# Patient Record
Sex: Male | Born: 1945 | Race: White | Hispanic: No | Marital: Married | Smoking: Former smoker
Health system: Southern US, Community
[De-identification: ages and names within clinical notes are randomized; demographics above are authoritative.]

## PROBLEM LIST (undated history)

## (undated) DIAGNOSIS — M1990 Unspecified osteoarthritis, unspecified site: Secondary | ICD-10-CM

## (undated) DIAGNOSIS — I2510 Atherosclerotic heart disease of native coronary artery without angina pectoris: Secondary | ICD-10-CM

## (undated) DIAGNOSIS — F32A Depression, unspecified: Secondary | ICD-10-CM

## (undated) DIAGNOSIS — Z87442 Personal history of urinary calculi: Secondary | ICD-10-CM

## (undated) DIAGNOSIS — D126 Benign neoplasm of colon, unspecified: Secondary | ICD-10-CM

## (undated) DIAGNOSIS — F329 Major depressive disorder, single episode, unspecified: Secondary | ICD-10-CM

## (undated) DIAGNOSIS — K219 Gastro-esophageal reflux disease without esophagitis: Secondary | ICD-10-CM

## (undated) DIAGNOSIS — M109 Gout, unspecified: Secondary | ICD-10-CM

## (undated) DIAGNOSIS — E119 Type 2 diabetes mellitus without complications: Secondary | ICD-10-CM

## (undated) DIAGNOSIS — I10 Essential (primary) hypertension: Secondary | ICD-10-CM

## (undated) DIAGNOSIS — E669 Obesity, unspecified: Secondary | ICD-10-CM

## (undated) DIAGNOSIS — T7840XA Allergy, unspecified, initial encounter: Secondary | ICD-10-CM

## (undated) DIAGNOSIS — E039 Hypothyroidism, unspecified: Secondary | ICD-10-CM

## (undated) DIAGNOSIS — G4730 Sleep apnea, unspecified: Secondary | ICD-10-CM

## (undated) DIAGNOSIS — E785 Hyperlipidemia, unspecified: Secondary | ICD-10-CM

## (undated) DIAGNOSIS — I252 Old myocardial infarction: Secondary | ICD-10-CM

## (undated) DIAGNOSIS — N19 Unspecified kidney failure: Secondary | ICD-10-CM

## (undated) HISTORY — PX: CHOLECYSTECTOMY: SHX55

## (undated) HISTORY — PX: POLYPECTOMY: SHX149

## (undated) HISTORY — DX: Unspecified osteoarthritis, unspecified site: M19.90

## (undated) HISTORY — DX: Depression, unspecified: F32.A

## (undated) HISTORY — DX: Benign neoplasm of colon, unspecified: D12.6

## (undated) HISTORY — PX: COLONOSCOPY: SHX174

## (undated) HISTORY — DX: Allergy, unspecified, initial encounter: T78.40XA

## (undated) HISTORY — DX: Type 2 diabetes mellitus without complications: E11.9

---
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
## 1993-10-04 HISTORY — PX: CORONARY ANGIOPLASTY: SHX604

## 1994-10-04 HISTORY — PX: CORONARY ANGIOPLASTY WITH STENT PLACEMENT: SHX49

## 2000-03-14 ENCOUNTER — Emergency Department (HOSPITAL_COMMUNITY): Admission: EM | Admit: 2000-03-14 | Discharge: 2000-03-14 | Payer: Self-pay | Admitting: Emergency Medicine

## 2000-03-14 ENCOUNTER — Encounter: Payer: Self-pay | Admitting: Emergency Medicine

## 2004-12-16 ENCOUNTER — Encounter: Admission: RE | Admit: 2004-12-16 | Discharge: 2004-12-16 | Payer: Self-pay | Admitting: Internal Medicine

## 2005-02-02 ENCOUNTER — Ambulatory Visit: Payer: Self-pay | Admitting: Internal Medicine

## 2005-02-11 ENCOUNTER — Ambulatory Visit: Payer: Self-pay | Admitting: Internal Medicine

## 2005-03-03 ENCOUNTER — Ambulatory Visit: Payer: Self-pay | Admitting: Internal Medicine

## 2006-03-25 ENCOUNTER — Ambulatory Visit: Payer: Self-pay | Admitting: Internal Medicine

## 2006-09-02 ENCOUNTER — Emergency Department (HOSPITAL_COMMUNITY): Admission: EM | Admit: 2006-09-02 | Discharge: 2006-09-02 | Payer: Self-pay | Admitting: Emergency Medicine

## 2007-03-09 ENCOUNTER — Ambulatory Visit: Payer: Self-pay | Admitting: Internal Medicine

## 2007-03-15 ENCOUNTER — Encounter: Payer: Self-pay | Admitting: Gastroenterology

## 2007-03-15 ENCOUNTER — Ambulatory Visit: Payer: Self-pay | Admitting: Gastroenterology

## 2007-03-15 ENCOUNTER — Encounter: Payer: Self-pay | Admitting: Internal Medicine

## 2008-02-24 DIAGNOSIS — N184 Chronic kidney disease, stage 4 (severe): Secondary | ICD-10-CM | POA: Insufficient documentation

## 2008-02-24 DIAGNOSIS — K5730 Diverticulosis of large intestine without perforation or abscess without bleeding: Secondary | ICD-10-CM | POA: Insufficient documentation

## 2008-02-24 DIAGNOSIS — J309 Allergic rhinitis, unspecified: Secondary | ICD-10-CM | POA: Insufficient documentation

## 2008-02-24 DIAGNOSIS — M109 Gout, unspecified: Secondary | ICD-10-CM | POA: Insufficient documentation

## 2008-02-24 DIAGNOSIS — I2510 Atherosclerotic heart disease of native coronary artery without angina pectoris: Secondary | ICD-10-CM | POA: Insufficient documentation

## 2008-02-24 DIAGNOSIS — E7800 Pure hypercholesterolemia, unspecified: Secondary | ICD-10-CM | POA: Insufficient documentation

## 2008-02-24 DIAGNOSIS — Z87442 Personal history of urinary calculi: Secondary | ICD-10-CM | POA: Insufficient documentation

## 2008-02-24 DIAGNOSIS — I219 Acute myocardial infarction, unspecified: Secondary | ICD-10-CM | POA: Insufficient documentation

## 2008-02-24 DIAGNOSIS — I119 Hypertensive heart disease without heart failure: Secondary | ICD-10-CM | POA: Insufficient documentation

## 2008-02-24 DIAGNOSIS — K222 Esophageal obstruction: Secondary | ICD-10-CM | POA: Insufficient documentation

## 2008-02-24 DIAGNOSIS — K219 Gastro-esophageal reflux disease without esophagitis: Secondary | ICD-10-CM

## 2008-02-24 DIAGNOSIS — K649 Unspecified hemorrhoids: Secondary | ICD-10-CM | POA: Insufficient documentation

## 2008-02-24 DIAGNOSIS — F329 Major depressive disorder, single episode, unspecified: Secondary | ICD-10-CM | POA: Insufficient documentation

## 2008-02-24 DIAGNOSIS — D126 Benign neoplasm of colon, unspecified: Secondary | ICD-10-CM | POA: Insufficient documentation

## 2008-02-24 DIAGNOSIS — E785 Hyperlipidemia, unspecified: Secondary | ICD-10-CM | POA: Insufficient documentation

## 2008-02-24 DIAGNOSIS — E039 Hypothyroidism, unspecified: Secondary | ICD-10-CM | POA: Insufficient documentation

## 2008-02-24 HISTORY — DX: Gastro-esophageal reflux disease without esophagitis: K21.9

## 2010-03-03 ENCOUNTER — Emergency Department (HOSPITAL_COMMUNITY): Admission: EM | Admit: 2010-03-03 | Discharge: 2010-03-03 | Payer: Self-pay | Admitting: Emergency Medicine

## 2010-03-06 ENCOUNTER — Encounter (INDEPENDENT_AMBULATORY_CARE_PROVIDER_SITE_OTHER): Payer: Self-pay | Admitting: *Deleted

## 2010-11-05 NOTE — Procedures (Signed)
Summary: [facility]  [facility]   Imported By: Bubba Hales 03/09/2010 08:47:58  _____________________________________________________________________  External Attachment:    Type:   Image     Comment:   External Document

## 2010-11-05 NOTE — Letter (Signed)
Summary: Colonoscopy Letter  [facility]  [place], [place] [address]   Phone: [phone]  Fax: [phone]      March 06, 2010 MRN: [id number]   [place], [place]  [id number]   Dear Dalton Mitchell,   According to your medical record, it is time for you to schedule a Colonoscopy. The American Cancer Society recommends this procedure as a method to detect early colon cancer. Patients with a family history of colon cancer, or a personal history of colon polyps or inflammatory bowel disease are at increased risk.  This letter has been generated based on the recommendations made at the time of your procedure. If you feel that in your particular situation this may no longer apply, please contact our office.  Please call our office at [phone] to schedule this appointment or to update your records at your earliest convenience.  Thank you for cooperating with [place] to provide you with the very best care possible.   Sincerely,  Docia Chuck. Henrene Pastor, M.D.  [facility] [phone]

## 2010-12-21 LAB — POCT CARDIAC MARKERS
Myoglobin, poc: 126 ng/mL (ref 12–200)
Myoglobin, poc: 67.4 ng/mL (ref 12–200)

## 2010-12-21 LAB — BASIC METABOLIC PANEL
CO2: 23 mEq/L (ref 19–32)
Calcium: 9.5 mg/dL (ref 8.4–10.5)
Chloride: 105 mEq/L (ref 96–112)
GFR calc Af Amer: 44 mL/min — ABNORMAL LOW (ref >60)
Glucose, Bld: 118 mg/dL — ABNORMAL HIGH (ref 70–99)
Potassium: 4.2 mEq/L (ref 3.5–5.1)
Sodium: 137 mEq/L (ref 135–145)

## 2010-12-21 LAB — CBC
HCT: 45.6 % (ref 39.0–52.0)
Hemoglobin: 15.4 g/dL (ref 13.0–17.0)
MCHC: 33.9 g/dL (ref 30.0–36.0)
MCV: 94.8 fL (ref 78.0–100.0)
RBC: 4.81 MIL/uL (ref 4.22–5.81)
RDW: 13.1 % (ref 11.5–15.5)

## 2010-12-21 LAB — URINALYSIS, ROUTINE W REFLEX MICROSCOPIC
Bilirubin Urine: NEGATIVE
Glucose, UA: NEGATIVE mg/dL
Protein, ur: 30 mg/dL — AB
Urobilinogen, UA: 0.2 mg/dL (ref 0.0–1.0)

## 2010-12-21 LAB — URINE MICROSCOPIC-ADD ON

## 2011-02-16 NOTE — Assessment & Plan Note (Signed)
[facility]                         GASTROENTEROLOGY OFFICE NOTE   NAME:Dalton Mitchell, Dalton Mitchell                  MRN:          [id number]  DATE:03/09/2007                            DOB:          1946/08/18    REFERRING PHYSICIAN:  Janalyn Rouse, M.D.   OFFICE CONSULTATION NOTE   REASON FOR CONSULTATION:  1. Dysphagia.  2. Overdue for surveillance colonoscopy.   HISTORY:  This is a 65 year old white male with a history of  hypertension, coronary artery disease, hyperlipidemia, hypothyroidism,  depression, gout, chronic gastroesophageal reflux disease, and  adenomatous colon polyps.  The patient underwent initial surveillance  colonoscopy in May of 2006 because of a family history of colon cancer  in grandparent.  He was found to have a large sessile villous adenoma of  the ascending colon, which was removed in piecemeal fashion.  He was  also noted to have mild diverticulosis and hemorrhoids.  The patient was  to followup, but failed to do so on 2 separate occasions.  Lower GI  complaints include chronic constipation, for which he takes fiber.  He  denies abdominal pain or bleeding.  With regards to his reflux disease,  he had been on Pepcid.  This seems to control symptoms reasonably.  He  subsequently went off Pepcid, concerned that this may have contributed  to his constipation.  Thereafter, significant problems with heartburn  and indigestion as well.  He has had progressive intermittent solid food  dysphagia.  His weight has been fairly stable and fluctuates a few  pounds each way.   PAST MEDICAL HISTORY:  Extensive, as outlined above.   ALLERGIES:  No known drug allergies.   CURRENT MEDICATIONS:  1. Pepcid AC p.r.n.  2. Metoprolol 50 mg b.i.d.  3. Colchicine 0.6 mg b.i.d.  4. Xanax 0.5 mg at night.  5. Glucosamine 2 in the morning and 1 at night.  6. Multivitamin.  7. Fibercon.  8. Crestor 40 mg b.i.d.  9. Allopurinol 200 mg  daily.  10.Darvocet-N 100 p.r.n.  11.Protonix (recently started) 40 mg daily.  12.Synthroid 50 mcg daily.  13.Paxil 40 mg daily.  14.Methylin 5 mg p.r.n.   FAMILY HISTORY:  Grandparent with colon cancer.   SOCIAL HISTORY:  The patient is married.  He is accompanied by his wife.  They have 8 children.  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  He does not use alcohol  or tobacco.   PHYSICAL EXAM:  A well-appearing male in no acute distress.  Blood pressure 118/64, heart rate 72 and regular, weight 267.6 pounds.  HEENT:  Sclerae anicteric.  Conjunctivae pink.  Oral mucosa is intact.  No adenopathy.  LUNGS:  Clear.  HEART:  Regular.  ABDOMEN:  Obese and soft without tenderness, mass, or hernia.  Good  bowel sounds heard.  EXTREMITIES:  Without edema.   IMPRESSION:  1. Gastroesophageal reflux disease with intermittent dysphagia.  Rule      out peptic stricture.  Rule out neoplasm.  Rule out Barrett's      esophagus.  Rule out esophagitis.  2. Intermittent constipation, likely functional.  3. History of large ascending colon villous  polyp status post      piecemeal resection in 2006, overdue for surveillance.  4. Multiple medical problems.   RECOMMENDATIONS:  1. Continue Protonix 40 mg p.o. daily.  2. Reflux precautions with attention to weight loss.  Elevation of the      head of the bed, and avoidance of late night meals.  3. Schedule upper endoscopy with possible esophageal dilation.  The      nature of the procedure, as well as the risks, benefits, and      alternatives have been reviewed.  He understood and agreed to      proceed.  4. Schedule surveillance colonoscopy with polypectomy if necessary.      The nature of the procedure, as well as the risks, benefits, and      alternatives were reviewed.  He understood and agreed to proceed.     Docia Chuck. Henrene Pastor, MD  Electronically Signed    JNP/MedQ  DD: 03/09/2007  DT: 03/09/2007  Job #: [id number]   cc:   Janalyn Rouse, M.D.

## 2011-05-26 ENCOUNTER — Encounter: Payer: Self-pay | Admitting: Internal Medicine

## 2011-06-11 ENCOUNTER — Ambulatory Visit (AMBULATORY_SURGERY_CENTER): Payer: Medicare Other | Admitting: *Deleted

## 2011-06-11 VITALS — Ht 70.0 in | Wt 254.0 lb

## 2011-06-11 DIAGNOSIS — Z1211 Encounter for screening for malignant neoplasm of colon: Secondary | ICD-10-CM

## 2011-06-11 MED ORDER — PEG-KCL-NACL-NASULF-NA ASC-C 100 G PO SOLR: ORAL | Status: DC

## 2011-06-14 ENCOUNTER — Encounter: Payer: Self-pay | Admitting: Internal Medicine

## 2011-06-15 ENCOUNTER — Telehealth: Payer: Self-pay | Admitting: Internal Medicine

## 2011-06-15 NOTE — Telephone Encounter (Signed)
Rx for MoviPrep called in to [facility] on [address].  Pt notified. Ulice Dash

## 2011-06-23 ENCOUNTER — Ambulatory Visit (AMBULATORY_SURGERY_CENTER): Payer: Medicare Other | Admitting: Internal Medicine

## 2011-06-23 ENCOUNTER — Telehealth: Payer: Self-pay | Admitting: Internal Medicine

## 2011-06-23 ENCOUNTER — Encounter: Payer: Self-pay | Admitting: Internal Medicine

## 2011-06-23 VITALS — BP 125/80 | HR 68 | Temp 97.7°F | Resp 18 | Ht 73.0 in | Wt 234.0 lb

## 2011-06-23 DIAGNOSIS — Z86010 Personal history of colonic polyps: Secondary | ICD-10-CM

## 2011-06-23 DIAGNOSIS — D1330 Benign neoplasm of unspecified part of small intestine: Secondary | ICD-10-CM

## 2011-06-23 DIAGNOSIS — Z1211 Encounter for screening for malignant neoplasm of colon: Secondary | ICD-10-CM

## 2011-06-23 DIAGNOSIS — D126 Benign neoplasm of colon, unspecified: Secondary | ICD-10-CM

## 2011-06-23 MED ORDER — SODIUM CHLORIDE 0.9 % IV SOLN: 500.0000 mL | INTRAVENOUS | Status: DC

## 2011-06-23 NOTE — Telephone Encounter (Signed)
Wife called at 6 AM this morning to state that she dropped the second half of his movi-prep on the floor. I recommended to proceed with one half of the MiraLAX prep.  One half of 255 g bottle mixed in 32 ounces of Gatorade I instructed him to drink 8 ounces every 15 minutes until complete. This should be satisfactory to complete bowel preparation. He will call with any problems or questions. He plans to arrive at his appointment as scheduled

## 2011-06-23 NOTE — Patient Instructions (Signed)
Discharge instructions given with verbal understanding.  Handouts on polyps and hemorrhoids given.  Resume previous medications.

## 2011-06-24 ENCOUNTER — Telehealth: Payer: Self-pay | Admitting: *Deleted

## 2011-06-24 NOTE — Telephone Encounter (Signed)
No voicemail id, no mess left

## 2012-08-25 ENCOUNTER — Emergency Department (HOSPITAL_COMMUNITY): Payer: Medicare PPO

## 2012-08-25 ENCOUNTER — Emergency Department (HOSPITAL_COMMUNITY)
Admission: EM | Admit: 2012-08-25 | Discharge: 2012-08-25 | Disposition: A | Discharge status: Home / Self Care | Payer: Medicare PPO | Attending: Emergency Medicine | Admitting: Emergency Medicine

## 2012-08-25 ENCOUNTER — Encounter (HOSPITAL_COMMUNITY): Payer: Self-pay | Admitting: Emergency Medicine

## 2012-08-25 DIAGNOSIS — I129 Hypertensive chronic kidney disease with stage 1 through stage 4 chronic kidney disease, or unspecified chronic kidney disease: Secondary | ICD-10-CM | POA: Insufficient documentation

## 2012-08-25 DIAGNOSIS — Y929 Unspecified place or not applicable: Secondary | ICD-10-CM | POA: Insufficient documentation

## 2012-08-25 DIAGNOSIS — E785 Hyperlipidemia, unspecified: Secondary | ICD-10-CM | POA: Insufficient documentation

## 2012-08-25 DIAGNOSIS — S79919A Unspecified injury of unspecified hip, initial encounter: Secondary | ICD-10-CM

## 2012-08-25 DIAGNOSIS — Z860100 Personal history of colon polyps, unspecified: Secondary | ICD-10-CM | POA: Insufficient documentation

## 2012-08-25 DIAGNOSIS — IMO0002 Reserved for concepts with insufficient information to code with codable children: Secondary | ICD-10-CM | POA: Insufficient documentation

## 2012-08-25 DIAGNOSIS — I252 Old myocardial infarction: Secondary | ICD-10-CM | POA: Insufficient documentation

## 2012-08-25 DIAGNOSIS — Z9861 Coronary angioplasty status: Secondary | ICD-10-CM | POA: Insufficient documentation

## 2012-08-25 DIAGNOSIS — E039 Hypothyroidism, unspecified: Secondary | ICD-10-CM | POA: Insufficient documentation

## 2012-08-25 DIAGNOSIS — N189 Chronic kidney disease, unspecified: Secondary | ICD-10-CM | POA: Insufficient documentation

## 2012-08-25 DIAGNOSIS — Z7982 Long term (current) use of aspirin: Secondary | ICD-10-CM | POA: Insufficient documentation

## 2012-08-25 DIAGNOSIS — Z87891 Personal history of nicotine dependence: Secondary | ICD-10-CM | POA: Insufficient documentation

## 2012-08-25 DIAGNOSIS — T07XXXA Unspecified multiple injuries, initial encounter: Secondary | ICD-10-CM

## 2012-08-25 DIAGNOSIS — Z79899 Other long term (current) drug therapy: Secondary | ICD-10-CM | POA: Insufficient documentation

## 2012-08-25 DIAGNOSIS — W11XXXA Fall on and from ladder, initial encounter: Secondary | ICD-10-CM

## 2012-08-25 DIAGNOSIS — Y939 Activity, unspecified: Secondary | ICD-10-CM | POA: Insufficient documentation

## 2012-08-25 DIAGNOSIS — Z8719 Personal history of other diseases of the digestive system: Secondary | ICD-10-CM | POA: Insufficient documentation

## 2012-08-25 NOTE — ED Notes (Signed)
Pt able to stand and ambulate slowly.

## 2012-08-25 NOTE — ED Provider Notes (Signed)
History     CSN: [id number]  Arrival date & time 08/25/12  1243   First MD Initiated Contact with Patient 08/25/12 1330      Chief Complaint  Patient presents with  . Fall    (Consider location/radiation/quality/duration/timing/severity/associated sxs/prior treatment) Patient is a 66 y.o. male presenting with fall. The history is provided by the patient. No language interpreter was used.  Fall The accident occurred less than 1 hour ago. The fall occurred from a ladder. He fell from a height of 16 to 20 ft. He landed on dirt. The volume of blood lost was minimal (abrasions). Point of impact: right side. The pain is present in the right hip. The pain is at a severity of 1/10. The pain is mild. He was ambulatory at the scene. There was no entrapment after the fall. There was no drug use involved in the accident. There was no alcohol use involved in the accident. Pertinent negatives include no visual change, no fever, no numbness, no abdominal pain, no bowel incontinence, no nausea, no vomiting, no hematuria, no headaches, no hearing loss, no loss of consciousness and no tingling. The symptoms are aggravated by standing and ambulation. He has tried nothing for the symptoms.    Past Medical History  Diagnosis Date  . Allergy   . GERD (gastroesophageal reflux disease)   . Hyperlipidemia   . Hypertension   . Chronic kidney disease     40% function  . Myocardial infarction 1989  1995  . Thyroid disease     hypo  . Adenomatous colon polyp     Past Surgical History  Procedure Date  . Coronary angioplasty with stent placement 1996  . Coronary angioplasty 1995  . Colonoscopy     No family history on file.  History  Substance Use Topics  . Smoking status: Former Research scientist (life sciences)  . Smokeless tobacco: Not on file  . Alcohol Use: No      Review of Systems  Constitutional: Negative for fever.  Gastrointestinal: Negative for nausea, vomiting, abdominal pain and bowel incontinence.    Genitourinary: Negative for hematuria.  Neurological: Negative for tingling, loss of consciousness, numbness and headaches.  All other systems reviewed and are negative.    Allergies  Review of patient's allergies indicates no known allergies.  Home Medications   Current Outpatient Rx  Name  Route  Sig  Dispense  Refill  . ALLOPURINOL 300 MG PO TABS   Oral   Take 300 mg by mouth daily.           [name] AMLODIPINE BESYLATE 5 MG PO TABS   Oral   Take 5 mg by mouth daily.           . ASPIRIN EC 81 MG PO TBEC   Oral   Take 81 mg by mouth daily.         [name] FAMOTIDINE 10 MG PO CHEW   Oral   Chew 10 mg by mouth daily.         [name] FLUTICASONE PROPIONATE 50 MCG/ACT NA SUSP   Nasal   Place 2 sprays into the nose daily as needed. For congestion.         [name] GLUCOSAMINE-CHONDROITIN 500-400 MG PO TABS   Oral   Take 1 tablet by mouth 2 (two) times daily.         [name] LEVOTHYROXINE SODIUM 75 MCG PO TABS   Oral   Take 75 mcg by mouth daily.           [name]  LOSARTAN POTASSIUM 50 MG PO TABS   Oral   Take 1 tablet by mouth daily.          [name] METOPROLOL TARTRATE 50 MG PO TABS   Oral   Take 50 mg by mouth 2 (two) times daily.           . MULTI-VITAMIN/MINERALS PO TABS   Oral   Take 1 tablet by mouth daily.           [name] NIASPAN 1000 MG PO TBCR   Oral   Take 1,000 mg by mouth at bedtime.          [name] ROSUVASTATIN CALCIUM 40 MG PO TABS   Oral   Take 40 mg by mouth daily.         [name] ZOLPIDEM TARTRATE 10 MG PO TABS   Oral   Take 10 mg by mouth at bedtime as needed. For sleep.           BP 147/88  Pulse 85  Temp 98.8 F (37.1 C) (Oral)  Resp 18  SpO2 95%  Physical Exam  Nursing note and vitals reviewed. Constitutional: He appears well-developed and well-nourished. No distress.  HENT:  Head: Normocephalic and atraumatic.  Right Ear: External ear normal.  Left Ear: External ear normal.  Nose: Nose normal.  Mouth/Throat: Oropharynx is clear and moist. No  oropharyngeal exudate.  Eyes: Conjunctivae normal are normal. Pupils are equal, round, and reactive to light. Right eye exhibits no discharge. Left eye exhibits no discharge. No scleral icterus.  Neck: Normal range of motion. Neck supple. No JVD present. No tracheal deviation present.  Cardiovascular: Normal rate, regular rhythm, normal heart sounds and intact distal pulses.  Exam reveals no gallop and no friction rub.   No murmur heard. Pulmonary/Chest: No stridor.  Abdominal: Soft. Bowel sounds are normal. He exhibits no distension and no mass. There is no tenderness. There is no rebound and no guarding.  Musculoskeletal: Normal range of motion. He exhibits tenderness. He exhibits no edema.       Right hip: He exhibits tenderness. He exhibits normal range of motion, normal strength, no bony tenderness, no swelling, no crepitus, no deformity and no laceration.  Lymphadenopathy:    He has no cervical adenopathy.  Neurological: He is alert. No cranial nerve deficit.  Skin: Skin is warm and dry. No rash noted. He is not diaphoretic. No erythema. No pallor.          All abrasions are extremely superficial   Psychiatric: He has a normal mood and affect. His behavior is normal.    ED Course  Procedures (including critical care time)  Labs Reviewed - No data to display No results found.   No diagnosis found.   Date: 08/25/2012  Rate: 82 bpm  Rhythm: sinus  QRS Axis: left  Intervals: normal  ST/T Wave abnormalities: normal  Conduction Disutrbances:none  Narrative Interpretation:   Old EKG Reviewed: unchanged     MDM  Pt presents s/p a fall from a ladder.  He appears comfortable, NAD.  Note stable VS.  Althugh he fell upwards of 18 feet to the ground, he did not strike his head, have a LOC, and was ambulatory immediately afterwards.  He does have some right hip pain. Will start with x-rays of the hip then attempt weight bearing if the films are negative for fracture.  On exam he hs  intact ROM and no significant reproducible pain.  1540.  Pt has been in the  ER almost 3 hours.  He has demonstrated no hemodynamic or neurologic instability during this time period.  He is able to bear weight on his right leg and the x-ray does not demonstrate a hip fracture.  The pain is reproducible along the lateral aspect of the thigh/hip but there is no appreciable tenseness or swelling.  He has no midline neck tenderness, respiratory insufficiency, chest wall tenderness, abd pain, or midline back tenderness/step-offs.  He states he has pain medication he can take at home already.  Discussed indications for immediate return to the ER.  Both he and his wife demonstrate understanding.  Stressed the importance of safety when working with ladders.  Will refer for evaluation by ortho if the pain persists beyond 2-3 days.      Perlie Mayo, MD 08/25/12 [phone]

## 2012-08-25 NOTE — ED Notes (Signed)
Pt fell 16 ft off a ladder onto grass and dirt onto right hip. Pt did not hit head. Pt feels pain in right hip when walking. Pt drove himself here.

## 2012-08-25 NOTE — ED Notes (Signed)
Pt discharged home via wife; pt given and explained all discharge instructions, stated understanding; denied questions/concerns; pt stable at time of discharge

## 2012-08-25 NOTE — ED Notes (Addendum)
Pt has abrasion to upper right shin , outer right calf and  left little finger

## 2013-09-13 ENCOUNTER — Encounter (HOSPITAL_COMMUNITY): Payer: Self-pay | Admitting: Emergency Medicine

## 2013-09-13 ENCOUNTER — Emergency Department (HOSPITAL_COMMUNITY): Payer: Medicare Other

## 2013-09-13 ENCOUNTER — Inpatient Hospital Stay (HOSPITAL_COMMUNITY)
Admission: EM | Admit: 2013-09-13 | Discharge: 2013-09-25 | DRG: 233 | Disposition: A | Discharge status: Home / Self Care | Payer: Medicare Other | Attending: Thoracic Surgery (Cardiothoracic Vascular Surgery) | Admitting: Thoracic Surgery (Cardiothoracic Vascular Surgery)

## 2013-09-13 DIAGNOSIS — E039 Hypothyroidism, unspecified: Secondary | ICD-10-CM | POA: Diagnosis present

## 2013-09-13 DIAGNOSIS — N170 Acute kidney failure with tubular necrosis: Secondary | ICD-10-CM | POA: Diagnosis not present

## 2013-09-13 DIAGNOSIS — K219 Gastro-esophageal reflux disease without esophagitis: Secondary | ICD-10-CM | POA: Diagnosis present

## 2013-09-13 DIAGNOSIS — I2582 Chronic total occlusion of coronary artery: Secondary | ICD-10-CM | POA: Diagnosis present

## 2013-09-13 DIAGNOSIS — N184 Chronic kidney disease, stage 4 (severe): Secondary | ICD-10-CM | POA: Diagnosis present

## 2013-09-13 DIAGNOSIS — Z7982 Long term (current) use of aspirin: Secondary | ICD-10-CM

## 2013-09-13 DIAGNOSIS — K5900 Constipation, unspecified: Secondary | ICD-10-CM | POA: Diagnosis not present

## 2013-09-13 DIAGNOSIS — M109 Gout, unspecified: Secondary | ICD-10-CM | POA: Diagnosis present

## 2013-09-13 DIAGNOSIS — Z860100 Personal history of colon polyps, unspecified: Secondary | ICD-10-CM

## 2013-09-13 DIAGNOSIS — Z823 Family history of stroke: Secondary | ICD-10-CM

## 2013-09-13 DIAGNOSIS — R7309 Other abnormal glucose: Secondary | ICD-10-CM | POA: Diagnosis not present

## 2013-09-13 DIAGNOSIS — I200 Unstable angina: Secondary | ICD-10-CM

## 2013-09-13 DIAGNOSIS — Z6837 Body mass index (BMI) 37.0-37.9, adult: Secondary | ICD-10-CM

## 2013-09-13 DIAGNOSIS — M129 Arthropathy, unspecified: Secondary | ICD-10-CM | POA: Diagnosis present

## 2013-09-13 DIAGNOSIS — I2510 Atherosclerotic heart disease of native coronary artery without angina pectoris: Principal | ICD-10-CM | POA: Diagnosis present

## 2013-09-13 DIAGNOSIS — E785 Hyperlipidemia, unspecified: Secondary | ICD-10-CM | POA: Diagnosis present

## 2013-09-13 DIAGNOSIS — I1310 Hypertensive heart and chronic kidney disease without heart failure, with stage 1 through stage 4 chronic kidney disease, or unspecified chronic kidney disease: Secondary | ICD-10-CM | POA: Diagnosis present

## 2013-09-13 DIAGNOSIS — I119 Hypertensive heart disease without heart failure: Secondary | ICD-10-CM | POA: Diagnosis present

## 2013-09-13 DIAGNOSIS — Y832 Surgical operation with anastomosis, bypass or graft as the cause of abnormal reaction of the patient, or of later complication, without mention of misadventure at the time of the procedure: Secondary | ICD-10-CM | POA: Diagnosis not present

## 2013-09-13 DIAGNOSIS — I4891 Unspecified atrial fibrillation: Secondary | ICD-10-CM | POA: Diagnosis not present

## 2013-09-13 DIAGNOSIS — H532 Diplopia: Secondary | ICD-10-CM | POA: Diagnosis present

## 2013-09-13 DIAGNOSIS — J9819 Other pulmonary collapse: Secondary | ICD-10-CM | POA: Diagnosis not present

## 2013-09-13 DIAGNOSIS — R443 Hallucinations, unspecified: Secondary | ICD-10-CM | POA: Diagnosis not present

## 2013-09-13 DIAGNOSIS — IMO0002 Reserved for concepts with insufficient information to code with codable children: Secondary | ICD-10-CM | POA: Diagnosis not present

## 2013-09-13 DIAGNOSIS — H538 Other visual disturbances: Secondary | ICD-10-CM | POA: Diagnosis present

## 2013-09-13 DIAGNOSIS — D62 Acute posthemorrhagic anemia: Secondary | ICD-10-CM | POA: Diagnosis not present

## 2013-09-13 DIAGNOSIS — D696 Thrombocytopenia, unspecified: Secondary | ICD-10-CM | POA: Diagnosis not present

## 2013-09-13 DIAGNOSIS — I10 Essential (primary) hypertension: Secondary | ICD-10-CM | POA: Diagnosis not present

## 2013-09-13 DIAGNOSIS — I252 Old myocardial infarction: Secondary | ICD-10-CM

## 2013-09-13 DIAGNOSIS — E669 Obesity, unspecified: Secondary | ICD-10-CM | POA: Insufficient documentation

## 2013-09-13 DIAGNOSIS — Z87891 Personal history of nicotine dependence: Secondary | ICD-10-CM

## 2013-09-13 DIAGNOSIS — Z951 Presence of aortocoronary bypass graft: Secondary | ICD-10-CM

## 2013-09-13 DIAGNOSIS — E875 Hyperkalemia: Secondary | ICD-10-CM | POA: Diagnosis not present

## 2013-09-13 DIAGNOSIS — E8779 Other fluid overload: Secondary | ICD-10-CM | POA: Diagnosis not present

## 2013-09-13 DIAGNOSIS — Z9861 Coronary angioplasty status: Secondary | ICD-10-CM

## 2013-09-13 DIAGNOSIS — G4730 Sleep apnea, unspecified: Secondary | ICD-10-CM | POA: Insufficient documentation

## 2013-09-13 HISTORY — DX: Gastro-esophageal reflux disease without esophagitis: K21.9

## 2013-09-13 HISTORY — DX: Essential (primary) hypertension: I10

## 2013-09-13 HISTORY — DX: Hypothyroidism, unspecified: E03.9

## 2013-09-13 HISTORY — DX: Gout, unspecified: M10.9

## 2013-09-13 HISTORY — DX: Unspecified kidney failure: N19

## 2013-09-13 HISTORY — DX: Sleep apnea, unspecified: G47.30

## 2013-09-13 HISTORY — DX: Hyperlipidemia, unspecified: E78.5

## 2013-09-13 HISTORY — DX: Obesity, unspecified: E66.9

## 2013-09-13 HISTORY — DX: Old myocardial infarction: I25.2

## 2013-09-13 HISTORY — DX: Atherosclerotic heart disease of native coronary artery without angina pectoris: I25.10

## 2013-09-13 HISTORY — DX: Personal history of urinary calculi: Z87.442

## 2013-09-13 HISTORY — DX: Unstable angina: I20.0

## 2013-09-13 LAB — COMPREHENSIVE METABOLIC PANEL
ALT: 23 U/L (ref 0–53)
ALT: 20 U/L (ref 0–53)
AST: 20 U/L (ref 0–37)
Alkaline Phosphatase: 57 U/L (ref 39–117)
Alkaline Phosphatase: 57 U/L (ref 39–117)
CO2: 22 mEq/L (ref 19–32)
CO2: 24 mEq/L (ref 19–32)
Calcium: 9.6 mg/dL (ref 8.4–10.5)
Calcium: 9.4 mg/dL (ref 8.4–10.5)
Chloride: 105 mEq/L (ref 96–112)
Creatinine, Ser: 1.93 mg/dL — ABNORMAL HIGH (ref 0.50–1.35)
GFR calc Af Amer: 40 mL/min — ABNORMAL LOW (ref >90)
GFR calc Af Amer: 40 mL/min — ABNORMAL LOW (ref >90)
GFR calc non Af Amer: 35 mL/min — ABNORMAL LOW (ref >90)
Glucose, Bld: 98 mg/dL (ref 70–99)
Glucose, Bld: 92 mg/dL (ref 70–99)
Potassium: 4.1 mEq/L (ref 3.5–5.1)
Potassium: 4.4 mEq/L (ref 3.5–5.1)
Sodium: 139 mEq/L (ref 135–145)
Sodium: 141 mEq/L (ref 135–145)
Total Bilirubin: 0.4 mg/dL (ref 0.3–1.2)
Total Protein: 7.6 g/dL (ref 6.0–8.3)

## 2013-09-13 LAB — CBC WITH DIFFERENTIAL/PLATELET
Basophils Absolute: 0 10*3/uL (ref 0.0–0.1)
Hemoglobin: 16.5 g/dL (ref 13.0–17.0)
Lymphocytes Relative: 24 % (ref 12–46)
Lymphs Abs: 1.6 10*3/uL (ref 0.7–4.0)
MCV: 95.1 fL (ref 78.0–100.0)
Neutrophils Relative %: 62 % (ref 43–77)
Platelets: 178 10*3/uL (ref 150–400)
RBC: 5.09 MIL/uL (ref 4.22–5.81)
RDW: 13.5 % (ref 11.5–15.5)
WBC: 6.6 10*3/uL (ref 4.0–10.5)

## 2013-09-13 LAB — URINALYSIS, ROUTINE W REFLEX MICROSCOPIC
Bilirubin Urine: NEGATIVE
Glucose, UA: NEGATIVE mg/dL
Specific Gravity, Urine: 1.006 (ref 1.005–1.030)
Urobilinogen, UA: 0.2 mg/dL (ref 0.0–1.0)

## 2013-09-13 LAB — TSH: TSH: 3.053 u[IU]/mL (ref 0.350–4.500)

## 2013-09-13 LAB — APTT: aPTT: 35 seconds (ref 24–37)

## 2013-09-13 LAB — HEPARIN LEVEL (UNFRACTIONATED): Heparin Unfractionated: 0.5 IU/mL (ref 0.30–0.70)

## 2013-09-13 LAB — PROTIME-INR
INR: 1.01 (ref 0.00–1.49)
Prothrombin Time: 13.1 seconds (ref 11.6–15.2)
Prothrombin Time: 13.3 seconds (ref 11.6–15.2)

## 2013-09-13 LAB — URINE MICROSCOPIC-ADD ON

## 2013-09-13 MED ORDER — HEPARIN (PORCINE) IN NACL 100-0.45 UNIT/ML-% IJ SOLN
1300.0000 [IU]/h | INTRAMUSCULAR | Status: DC
  Administered 2013-09-13: 1300 [IU]/h via INTRAVENOUS
  Filled 2013-09-13 (×3): qty 250

## 2013-09-13 MED ORDER — ASPIRIN EC 81 MG PO TBEC
81.0000 mg | DELAYED_RELEASE_TABLET | Freq: Every day | ORAL | Status: DC
Start: 2013-09-13 — End: 2013-09-13

## 2013-09-13 MED ORDER — ASPIRIN EC 81 MG PO TBEC
81.0000 mg | DELAYED_RELEASE_TABLET | Freq: Every day | ORAL | Status: DC
  Administered 2013-09-15 – 2013-09-16 (×2): 81 mg via ORAL
  Filled 2013-09-13 (×3): qty 1

## 2013-09-13 MED ORDER — ZOLPIDEM TARTRATE 5 MG PO TABS: 5.0000 mg | ORAL_TABLET | Freq: Every evening | ORAL | Status: DC | PRN

## 2013-09-13 MED ORDER — HEPARIN BOLUS VIA INFUSION
4000.0000 [IU] | Freq: Once | INTRAVENOUS | Status: AC
  Administered 2013-09-13: 4000 [IU] via INTRAVENOUS
  Filled 2013-09-13: qty 4000

## 2013-09-13 MED ORDER — FAMOTIDINE 10 MG PO TABS
10.0000 mg | ORAL_TABLET | Freq: Every day | ORAL | Status: DC
  Administered 2013-09-14 – 2013-09-16 (×3): 10 mg via ORAL
  Filled 2013-09-13 (×4): qty 1

## 2013-09-13 MED ORDER — SODIUM CHLORIDE 0.9 % IJ SOLN: 3.0000 mL | INTRAMUSCULAR | Status: DC | PRN

## 2013-09-13 MED ORDER — METOPROLOL TARTRATE 50 MG PO TABS
50.0000 mg | ORAL_TABLET | Freq: Two times a day (BID) | ORAL | Status: DC
  Administered 2013-09-13 – 2013-09-16 (×7): 50 mg via ORAL
  Filled 2013-09-13 (×9): qty 1

## 2013-09-13 MED ORDER — DIAZEPAM 5 MG PO TABS
10.0000 mg | ORAL_TABLET | ORAL | Status: AC
  Administered 2013-09-14: 10 mg via ORAL
  Filled 2013-09-13: qty 2

## 2013-09-13 MED ORDER — NITROGLYCERIN 0.4 MG SL SUBL: 0.4000 mg | SUBLINGUAL_TABLET | SUBLINGUAL | Status: DC | PRN

## 2013-09-13 MED ORDER — ALLOPURINOL 300 MG PO TABS
300.0000 mg | ORAL_TABLET | Freq: Every day | ORAL | Status: DC
  Administered 2013-09-14 – 2013-09-16 (×3): 300 mg via ORAL
  Filled 2013-09-13 (×4): qty 1

## 2013-09-13 MED ORDER — HYDROCODONE-ACETAMINOPHEN 5-325 MG PO TABS: 1.0000 | ORAL_TABLET | Freq: Four times a day (QID) | ORAL | Status: DC | PRN

## 2013-09-13 MED ORDER — ZOLPIDEM TARTRATE 5 MG PO TABS
10.0000 mg | ORAL_TABLET | Freq: Every day | ORAL | Status: DC
  Administered 2013-09-13 – 2013-09-16 (×4): 10 mg via ORAL
  Filled 2013-09-13 (×4): qty 2

## 2013-09-13 MED ORDER — ASPIRIN EC 81 MG PO TBEC: 81.0000 mg | DELAYED_RELEASE_TABLET | Freq: Every day | ORAL | Status: DC

## 2013-09-13 MED ORDER — SODIUM CHLORIDE 0.9 % IV SOLN: 250.0000 mL | INTRAVENOUS | Status: DC | PRN

## 2013-09-13 MED ORDER — FLUTICASONE PROPIONATE 50 MCG/ACT NA SUSP
2.0000 | Freq: Every day | NASAL | Status: DC
  Administered 2013-09-13 – 2013-09-16 (×4): 2 via NASAL
  Filled 2013-09-13: qty 16

## 2013-09-13 MED ORDER — ALPRAZOLAM 0.5 MG PO TABS: 0.5000 mg | ORAL_TABLET | Freq: Every evening | ORAL | Status: DC | PRN

## 2013-09-13 MED ORDER — ASPIRIN 81 MG PO CHEW
81.0000 mg | CHEWABLE_TABLET | ORAL | Status: AC
  Administered 2013-09-14: 81 mg via ORAL
  Filled 2013-09-13: qty 1

## 2013-09-13 MED ORDER — FAMOTIDINE 10 MG PO CHEW: 10.0000 mg | CHEWABLE_TABLET | Freq: Every day | ORAL | Status: DC

## 2013-09-13 MED ORDER — AMLODIPINE BESYLATE 5 MG PO TABS
5.0000 mg | ORAL_TABLET | Freq: Every day | ORAL | Status: DC
  Administered 2013-09-14 – 2013-09-16 (×3): 5 mg via ORAL
  Filled 2013-09-13 (×4): qty 1

## 2013-09-13 MED ORDER — ACETAMINOPHEN 325 MG PO TABS: 650.0000 mg | ORAL_TABLET | ORAL | Status: DC | PRN

## 2013-09-13 MED ORDER — SODIUM CHLORIDE 0.9 % IV SOLN
1.0000 mL/kg/h | INTRAVENOUS | Status: DC
  Administered 2013-09-13: 1 mL/kg/h via INTRAVENOUS

## 2013-09-13 MED ORDER — ATORVASTATIN CALCIUM 10 MG PO TABS
10.0000 mg | ORAL_TABLET | Freq: Every day | ORAL | Status: DC
  Administered 2013-09-13 – 2013-09-16 (×4): 10 mg via ORAL
  Filled 2013-09-13 (×5): qty 1

## 2013-09-13 MED ORDER — LEVOTHYROXINE SODIUM 75 MCG PO TABS
75.0000 ug | ORAL_TABLET | Freq: Every day | ORAL | Status: DC
  Administered 2013-09-14 – 2013-09-16 (×3): 75 ug via ORAL
  Filled 2013-09-13 (×5): qty 1

## 2013-09-13 MED ORDER — SODIUM CHLORIDE 0.9 % IJ SOLN
3.0000 mL | Freq: Two times a day (BID) | INTRAMUSCULAR | Status: DC
  Administered 2013-09-15 – 2013-09-16 (×2): 3 mL via INTRAVENOUS

## 2013-09-13 NOTE — ED Notes (Signed)
Called main lab to run PT, INR & that they were already sent tube

## 2013-09-13 NOTE — Progress Notes (Signed)
No recurrent pain.  Appreciate nephrology note.  ECHO done earlier.  Will plan cath with limited contrast inlight of unstable angina and progressive symptoms. Cardiac catheterization was discussed with the patient fully including risks of myocardial infarction, death, stroke, bleeding, arrhythmia, dye allergy, renal insufficiency or bleeding.  The patient understands and is willing to proceed. Probable stage procedure if needed.  Kerry Hough MD [facility]

## 2013-09-13 NOTE — Progress Notes (Signed)
ANTICOAGULATION CONSULT NOTE - Initial Consult  Pharmacy Consult for heparin Indication: chest pain/ACS  No Known Allergies  Patient Measurements: Height: 5\' 9"  (175.3 cm) Weight: 246 lb (111.585 kg) IBW/kg (Calculated) : 70.7 Heparin Dosing Weight: 95kg  Vital Signs: Temp: 97.7 F (36.5 C) (12/11 0958) Temp src: Oral (12/11 0958) BP: 111/68 mmHg (12/11 1132) Pulse Rate: 59 (12/11 1132)  Labs:  Recent Labs  09/13/13 1040  HGB 16.5  HCT 48.4  PLT 178  CREATININE 1.93*  TROPONINI <0.30    Estimated Creatinine Clearance: 46.4 ml/min (by C-G formula based on Cr of 1.93).   Medical History: Past Medical History  Diagnosis Date  . Adenomatous colon polyp   . Old inferior wall myocardial infarction   . Sleep apnea   . Hypothyroidism   . Hyperlipidemia   . Gout   . Hypertensive heart disease   . CAD     Prior inferior MI treated with stent 1995    . GERD 02/24/2008  . 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 History of kidney stones   . Obesity (BMI 30-39.9)    Assessment: Dalton Mitchell with 4 intermittent episodes of CP since last night. Per RN note, he took ASA 325mg  with each episode of CP. Not on any anticoagulation PTA. Initial troponin negative. Baseline Hgb 16.5, plts 178. No bleeding noted.   Goal of Therapy:  Heparin level 0.3-0.7 units/ml Monitor platelets by anticoagulation protocol: Yes   Plan:  1. Stat aPTT/INR for baseline values 2. Heparin IV bolus of 4000 units x1 3. Start heparin gtt at 1300 units/hr 4. Heparin level in 6 hours 5. Daily heparin level and CBC 6. Follow for cath plans, [facility] plans, s/s bleeding  Janai Brannigan D. Flora Ratz, PharmD, BCPS Clinical Pharmacist Pager: [phone] 09/13/2013 11:54 AM

## 2013-09-13 NOTE — ED Notes (Signed)
States that he had an episode of exertional cp x 1 month ago and  Then yesterday it got worse took an asa today but did not take any nitro when he rested it  Got better

## 2013-09-13 NOTE — Progress Notes (Signed)
ANTICOAGULATION CONSULT NOTE - Initial Consult  Pharmacy Consult for heparin Indication: chest pain/ACS  Allergies  Allergen Reactions  . Ace Inhibitors Cough    Patient Measurements: Height: 5\' 9"  (175.3 cm) Weight: 247 lb (112.038 kg) IBW/kg (Calculated) : 70.7 Heparin Dosing Weight: 95kg  Vital Signs: Temp: 97.8 F (36.6 C) (12/11 1432) Temp src: Oral (12/11 1432) BP: 122/77 mmHg (12/11 1432) Pulse Rate: 62 (12/11 1432)  Labs:  Recent Labs  09/13/13 1040 09/13/13 1252 09/13/13 1254 09/13/13 1800  HGB 16.5  --   --   --   HCT 48.4  --   --   --   PLT 178  --   --   --   APTT 35 >200*  --   --   LABPROT 13.1 13.3  --   --   INR 1.01 1.03  --   --   HEPARINUNFRC  --   --   --  0.50  CREATININE 1.93* 1.93*  --   --   TROPONINI <0.30  --  <0.30 <0.30    Estimated Creatinine Clearance: 46.4 ml/min (by C-G formula based on Cr of 1.93).   Medical History: Past Medical History  Diagnosis Date  . Adenomatous colon polyp   . Old inferior wall myocardial infarction   . Sleep apnea   . Hypothyroidism   . Hyperlipidemia   . Gout   . Hypertensive heart disease   . CAD     Prior inferior MI treated with stent 1995    . GERD 02/24/2008  . 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 History of kidney stones   . Obesity (BMI 30-39.9)    Assessment: 6 hour heparin level is 0.5, therapeutic on 1300 units/hr IV heparin for chest pain/ACS in this 67 y.o male.  Baseline Hgb 16.5, plts 178. No bleeding noted. Dr. Wynonia Lawman notes tonight patient has no recurrent pain. Plan for cardiac cath.    Goal of Therapy:  Heparin level 0.3-0.7 units/ml Monitor platelets by anticoagulation protocol: Yes   Plan:  Continue heparin gtt at 1300 units/hr Daily heparin level and CBC Follow for cath plans, [facility] plans, s/s bleeding  Nicole Cella, RPh Clinical Pharmacist Pager: [phone] 09/13/2013

## 2013-09-13 NOTE — Progress Notes (Signed)
Echocardiogram 2D Echocardiogram has been performed.  Shalece Staffa 09/13/2013, 4:24 PM

## 2013-09-13 NOTE — ED Provider Notes (Signed)
CSN: [id number]     Arrival date & time 09/13/13  [id number] History   First MD Initiated Contact with Patient 09/13/13 1008     Chief Complaint  Patient presents with  . Chest Pain   (Consider location/radiation/quality/duration/timing/severity/associated sxs/prior Treatment) HPI Comments: Patient presents with episode of left-sided chest pain that came on last night and is taking out the garbage. His similar episode of pain this morning at rest. It resolved after aspirin about 20 minutes. It radiated to his left arm her mind him of his MI that he had many years ago. Last stress test was in January with Dr. Wynonia Lawman. He has one stent. He had an episode of chest pain about one month ago with exertion it is similar. He has not seen his cardiologist since. Denies any chest pain currently. No shortness of breath, nausea or dizziness.  The history is provided by the patient.    Past Medical History  Diagnosis Date  . Adenomatous colon polyp   . Old inferior wall myocardial infarction   . Sleep apnea   . Hypothyroidism   . Hyperlipidemia   . Gout   . Hypertensive heart disease   . CAD     Prior inferior MI treated with stent 1995    . GERD 02/24/2008  . 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 History of kidney stones   . Obesity (BMI 30-39.9)    Past Surgical History  Procedure Laterality Date  . Coronary angioplasty with stent placement  1996  . Coronary angioplasty  1995  . Colonoscopy     History reviewed. No pertinent family history. History  Substance Use Topics  . Smoking status: Former Research scientist (life sciences)  . Smokeless tobacco: Never Used  . Alcohol Use: No    Review of Systems  Constitutional: Negative for fever, activity change and appetite change.  Respiratory: Positive for chest tightness and shortness of breath. Negative for cough.   Cardiovascular: Positive for chest pain.  Gastrointestinal: Negative for nausea, vomiting and abdominal pain.  Genitourinary: Negative  for dysuria and hematuria.  Musculoskeletal: Negative for arthralgias, back pain and myalgias.  Skin: Negative for rash.  Neurological: Negative for dizziness, weakness and headaches.  A complete 10 system review of systems was obtained and all systems are negative except as noted in the HPI and PMH.    Allergies  Ace inhibitors  Home Medications   Current Outpatient Rx  Name  Route  Sig  Dispense  Refill  . allopurinol (ZYLOPRIM) 300 MG tablet   Oral   Take 300 mg by mouth daily.           [name] ALPRAZolam (XANAX) 0.5 MG tablet   Oral   Take 0.5 mg by mouth at bedtime as needed for sleep.         [name] amLODipine (NORVASC) 5 MG tablet   Oral   Take 5 mg by mouth daily.           [name] aspirin EC 81 MG tablet   Oral   Take 81 mg by mouth daily.         [name] atorvastatin (LIPITOR) 10 MG tablet   Oral   Take 10 mg by mouth at bedtime.         . famotidine (PEPCID AC) 10 MG chewable tablet   Oral   Chew 10 mg by mouth daily.         . fluticasone (FLONASE) 50 MCG/ACT nasal spray   Each  Nare   Place 2 sprays into both nostrils at bedtime.         [name] glucosamine-chondroitin 500-400 MG tablet   Oral   Take 1 tablet by mouth 2 (two) times daily.         [name] HYDROcodone-acetaminophen (NORCO/VICODIN) 5-325 MG per tablet   Oral   Take 1 tablet by mouth every 6 (six) hours as needed for moderate pain.         [name] levothyroxine (SYNTHROID, LEVOTHROID) 75 MCG tablet   Oral   Take 75 mcg by mouth daily.           [name] losartan (COZAAR) 50 MG tablet   Oral   Take 1 tablet by mouth daily.          . metoprolol (LOPRESSOR) 50 MG tablet   Oral   Take 50 mg by mouth 2 (two) times daily.         . Multiple Vitamins-Minerals (MULTIVITAMIN WITH MINERALS) tablet   Oral   Take 1 tablet by mouth daily.           [name] zolpidem (AMBIEN) 10 MG tablet   Oral   Take 10 mg by mouth at bedtime.           BP 124/71  Pulse 60  Temp(Src) 97.7 F (36.5 C) (Oral)  Resp 14  Ht  5\' 9"  (1.753 m)  Wt 246 lb (111.585 kg)  BMI 36.31 kg/m2  SpO2 97% Physical Exam  Constitutional: He is oriented to person, place, and time. He appears well-developed and well-nourished. No distress.  HENT:  Head: Normocephalic and atraumatic.  Mouth/Throat: Oropharynx is clear and moist. No oropharyngeal exudate.  Eyes: Conjunctivae and EOM are normal. Pupils are equal, round, and reactive to light.  Neck: Normal range of motion. Neck supple.  Cardiovascular: Normal rate, regular rhythm, normal heart sounds and intact distal pulses.   No murmur heard. Pulmonary/Chest: Effort normal and breath sounds normal. No respiratory distress.  Abdominal: Soft. There is no tenderness. There is no rebound and no guarding.  Musculoskeletal: Normal range of motion. He exhibits no edema and no tenderness.  Neurological: He is alert and oriented to person, place, and time. No cranial nerve deficit. He exhibits normal muscle tone. Coordination normal.  Skin: Skin is warm.    ED Course  Procedures (including critical care time) Labs Review Labs Reviewed  COMPREHENSIVE METABOLIC PANEL - Abnormal; Notable for the following:    BUN 30 (*)    Creatinine, Ser 1.93 (*)    GFR calc non Af Amer 35 (*)    GFR calc Af Amer 40 (*)    All other components within normal limits  URINALYSIS, ROUTINE W REFLEX MICROSCOPIC - Abnormal; Notable for the following:    Hgb urine dipstick SMALL (*)    Protein, ur 100 (*)    All other components within normal limits  CBC WITH DIFFERENTIAL  TROPONIN I  APTT  PROTIME-INR  URINE MICROSCOPIC-ADD ON  HEPARIN LEVEL (UNFRACTIONATED)  PROTIME-INR  APTT  TSH  COMPREHENSIVE METABOLIC PANEL  TROPONIN I  TROPONIN I  TROPONIN I   Imaging Review Dg Chest 2 View  09/13/2013   CLINICAL DATA:  Mid left-sided chest pain with tingling down arm.  EXAM: CHEST  2 VIEW  COMPARISON:  03/03/2010  FINDINGS: Cardiac silhouette is upper limits of normal in size, unchanged. Thoracic  aorta remains moderately tortuous. The lungs are well inflated and clear. There is no evidence of pleural effusion  or pneumothorax. Multilevel osteophytosis is noted in the thoracic spine.  IMPRESSION: No evidence of active cardiopulmonary disease.   Electronically Signed   By: Logan Bores   On: 09/13/2013 11:07    EKG Interpretation    Date/Time:  Thursday September 13 2013 09:58:00 EST Ventricular Rate:  66 PR Interval:  186 QRS Duration: 88 QT Interval:  378 QTC Calculation: 396 R Axis:   -13 Text Interpretation:  Normal sinus rhythm Cannot rule out Anterior infarct , age undetermined Abnormal ECG No significant change was found Confirmed by Wyvonnia Dusky  MD, Daemon Dowty ([id number]) on 09/13/2013 10:32:42 AM            MDM  No diagnosis found. Episode of chest pain today on exertion resolved after rest. Similar episode yesterday and one month ago. Feels similar to previous MI. Currently denies any chest pain, back pain, shortness of breath, nausea, vomiting or dizziness.  EKG is unchanged. Patient is pain-free at this time. Troponin is negative. He took aspirin at home. Creatinine appears to be at baseline. History is concerning for unstable angina. Heparin drip will be started. Discussed with the patient's cardiologist Dr. Wynonia Lawman.  Patient will be admitted for unstable angina.    Ezequiel Essex, MD 09/13/13 1254

## 2013-09-13 NOTE — ED Notes (Signed)
Cardiology MD, Dr. Wynonia Lawman at bedside.

## 2013-09-13 NOTE — ED Notes (Signed)
C/o 4 intermittent episodes left side CP since last night lasting approx 15-20 min.  Denies any SOB, nausea, diaphoresis with CP. Did notice certain mvmts made pain worse.  States last 2 episodes woke him from sleep last night. Reports took ASA 325mg  with each episode CP.  Has had total of 325mg  x4 since 1900 yesterday. Denies CP presently

## 2013-09-13 NOTE — Consult Note (Signed)
I was asked by Dr. 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 Justin Mend at [facility].  On 02/08/13 creat was 1.84mg /dl . On 12/8 creat was 2.10 mg/dl. Apparently he is felt to have had low risk for progression based upon historical data. Therapy for the CKD includes BP control and use of an ARB Losartan.  His additional renal risks include hypertension, nephrolithiasis and BPH. Patient admitted with unstable angina pectoris and is felt to need a heart catheterization and Renal is asked to assist in management.  Dr. Wynonia Lawman has held the Losartan.   Past Medical History  Diagnosis Date  . Adenomatous colon polyp   . Old inferior wall myocardial infarction   . Sleep apnea   . Hypothyroidism   . Hyperlipidemia   . Gout   . Hypertensive heart disease   . CAD     Prior inferior MI treated with stent 1995    . GERD 02/24/2008  . 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 History of kidney stones   . Obesity (BMI 30-39.9)    Past Surgical History  Procedure Laterality Date  . Coronary angioplasty with stent placement  1996  . Coronary angioplasty  1995  . Colonoscopy     Social History:  reports that he has quit smoking. He has never used smokeless tobacco. He reports that he does not drink alcohol or use illicit drugs. Allergies:  Allergies  Allergen Reactions  . Ace Inhibitors Cough   History reviewed. No pertinent family history.  Medications:  Scheduled: . [START ON 09/14/2013] allopurinol  300 mg Oral Daily  . [START ON 09/14/2013] amLODipine  5 mg Oral Daily  . aspirin EC  81 mg Oral Daily  . [START ON 09/14/2013] aspirin EC  81 mg Oral Daily  . atorvastatin  10 mg Oral QHS  . [START ON 09/14/2013] famotidine  10 mg Oral Daily  . fluticasone  2 spray Each Nare QHS  . [START ON 09/14/2013] levothyroxine  75 mcg Oral QAC breakfast  . metoprolol  50 mg Oral BID  . sodium chloride  3 mL Intravenous  Q12H  . zolpidem  10 mg Oral QHS    ROS: Non contributory except as discussed in HPI  Blood pressure 122/77, pulse 62, temperature 97.8 F (36.6 C), temperature source Oral, resp. rate 16, height 5\' 9"  (1.753 m), weight 112.038 kg (247 lb), SpO2 97.00%.  General appearance: alert and cooperative Head: Normocephalic, without obvious abnormality, atraumatic Eyes: negative Ears: norma ext Nose: Nares normal. Septum midline. Mucosa normal. No drainage or sinus tenderness. Throat: lips, mucosa, and tongue normal; teeth and gums normal Resp: clear to auscultation bilaterally Chest wall: no tenderness Cardio: regular rate and rhythm, S1, S2 normal, no murmur, click, rub or gallop GI: soft, non-tender; bowel sounds normal; no masses,  no organomegaly Extremities: edema trace Skin: Skin color, texture, turgor normal. No rashes or lesions Neurologic: Grossly normal Results for orders placed during the hospital encounter of 09/13/13 (from the past 48 hour(s))  CBC WITH DIFFERENTIAL     Status: None   Collection Time    09/13/13 10:40 AM      Result Value Range   WBC 6.6  4.0 - 10.5 K/uL   RBC 5.09  4.22 - 5.81 MIL/uL   Hemoglobin 16.5  13.0 - 17.0 g/dL   HCT 48.4  39.0 - 52.0 %   MCV  95.1  78.0 - 100.0 fL   MCH 32.4  26.0 - 34.0 pg   MCHC 34.1  30.0 - 36.0 g/dL   RDW 13.5  11.5 - 15.5 %   Platelets 178  150 - 400 K/uL   Neutrophils Relative % 62  43 - 77 %   Neutro Abs 4.1  1.7 - 7.7 K/uL   Lymphocytes Relative 24  12 - 46 %   Lymphs Abs 1.6  0.7 - 4.0 K/uL   Monocytes Relative 12  3 - 12 %   Monocytes Absolute 0.8  0.1 - 1.0 K/uL   Eosinophils Relative 2  0 - 5 %   Eosinophils Absolute 0.1  0.0 - 0.7 K/uL   Basophils Relative 0  0 - 1 %   Basophils Absolute 0.0  0.0 - 0.1 K/uL  COMPREHENSIVE METABOLIC PANEL     Status: Abnormal   Collection Time    09/13/13 10:40 AM      Result Value Range   Sodium 139  135 - 145 mEq/L   Potassium 4.1  3.5 - 5.1 mEq/L   Chloride 105  96 - 112  mEq/L   CO2 22  19 - 32 mEq/L   Glucose, Bld 98  70 - 99 mg/dL   BUN 30 (*) 6 - 23 mg/dL   Creatinine, Ser 1.93 (*) 0.50 - 1.35 mg/dL   Calcium 9.6  8.4 - 10.5 mg/dL   Total Protein 7.6  6.0 - 8.3 g/dL   Albumin 4.1  3.5 - 5.2 g/dL   AST 20  0 - 37 U/L   ALT 23  0 - 53 U/L   Alkaline Phosphatase 57  39 - 117 U/L   Total Bilirubin 0.4  0.3 - 1.2 mg/dL   GFR calc non Af Amer 35 (*) >90 mL/min   GFR calc Af Amer 40 (*) >90 mL/min   Comment: (NOTE)     The eGFR has been calculated using the CKD EPI equation.     This calculation has not been validated in all clinical situations.     eGFR's persistently <90 mL/min signify possible Chronic Kidney     Disease.  TROPONIN I     Status: None   Collection Time    09/13/13 10:40 AM      Result Value Range   Troponin I <0.30  <0.30 ng/mL   Comment:            Due to the release kinetics of cTnI,     a negative result within the first hours     of the onset of symptoms does not rule out     myocardial infarction with certainty.     If myocardial infarction is still suspected,     repeat the test at appropriate intervals.  APTT     Status: None   Collection Time    09/13/13 10:40 AM      Result Value Range   aPTT 35  24 - 37 seconds  PROTIME-INR     Status: None   Collection Time    09/13/13 10:40 AM      Result Value Range   Prothrombin Time 13.1  11.6 - 15.2 seconds   INR 1.01  0.00 - 1.49  URINALYSIS, ROUTINE W REFLEX MICROSCOPIC     Status: Abnormal   Collection Time    09/13/13 11:42 AM      Result Value Range   Color, Urine YELLOW  YELLOW   APPearance  CLEAR  CLEAR   Specific Gravity, Urine 1.006  1.005 - 1.030   pH 5.5  5.0 - 8.0   Glucose, UA NEGATIVE  NEGATIVE mg/dL   Hgb urine dipstick SMALL (*) NEGATIVE   Bilirubin Urine NEGATIVE  NEGATIVE   Ketones, ur NEGATIVE  NEGATIVE mg/dL   Protein, ur 100 (*) NEGATIVE mg/dL   Urobilinogen, UA 0.2  0.0 - 1.0 mg/dL   Nitrite NEGATIVE  NEGATIVE   Leukocytes, UA NEGATIVE   NEGATIVE  URINE MICROSCOPIC-ADD ON     Status: None   Collection Time    09/13/13 11:42 AM      Result Value Range   RBC / HPF 0-2  <3 RBC/hpf  PROTIME-INR     Status: None   Collection Time    09/13/13 12:52 PM      Result Value Range   Prothrombin Time 13.3  11.6 - 15.2 seconds   INR 1.03  0.00 - 1.49  APTT     Status: Abnormal   Collection Time    09/13/13 12:52 PM      Result Value Range   aPTT >200 (*) 24 - 37 seconds   Comment:            IF BASELINE aPTT IS ELEVATED,     SUGGEST PATIENT RISK ASSESSMENT     BE USED TO DETERMINE APPROPRIATE     ANTICOAGULANT THERAPY.     REPEATED TO VERIFY     CRITICAL RESULT CALLED TO, READ BACK BY AND VERIFIED WITH:     Jaymes Graff (RN) [id number] 09/13/2013 L. LOMAX  COMPREHENSIVE METABOLIC PANEL     Status: Abnormal   Collection Time    09/13/13 12:52 PM      Result Value Range   Sodium 141  135 - 145 mEq/L   Potassium 4.4  3.5 - 5.1 mEq/L   Chloride 105  96 - 112 mEq/L   CO2 24  19 - 32 mEq/L   Glucose, Bld 92  70 - 99 mg/dL   BUN 29 (*) 6 - 23 mg/dL   Creatinine, Ser 1.93 (*) 0.50 - 1.35 mg/dL   Calcium 9.4  8.4 - 10.5 mg/dL   Total Protein 7.6  6.0 - 8.3 g/dL   Albumin 4.2  3.5 - 5.2 g/dL   AST 21  0 - 37 U/L   ALT 20  0 - 53 U/L   Alkaline Phosphatase 57  39 - 117 U/L   Total Bilirubin 0.4  0.3 - 1.2 mg/dL   GFR calc non Af Amer 35 (*) >90 mL/min   GFR calc Af Amer 40 (*) >90 mL/min   Comment: (NOTE)     The eGFR has been calculated using the CKD EPI equation.     This calculation has not been validated in all clinical situations.     eGFR's persistently <90 mL/min signify possible Chronic Kidney     Disease.  TROPONIN I     Status: None   Collection Time    09/13/13 12:54 PM      Result Value Range   Troponin I <0.30  <0.30 ng/mL   Comment:            Due to the release kinetics of cTnI,     a negative result within the first hours     of the onset of symptoms does not rule out     myocardial infarction with certainty.      If myocardial infarction is  still suspected,     repeat the test at appropriate intervals.   Dg Chest 2 View  09/13/2013   CLINICAL DATA:  Mid left-sided chest pain with tingling down arm.  EXAM: CHEST  2 VIEW  COMPARISON:  03/03/2010  FINDINGS: Cardiac silhouette is upper limits of normal in size, unchanged. Thoracic aorta remains moderately tortuous. The lungs are well inflated and clear. There is no evidence of pleural effusion or pneumothorax. Multilevel osteophytosis is noted in the thoracic spine.  IMPRESSION: No evidence of active cardiopulmonary disease.   Electronically Signed   By: Logan Bores   On: 09/13/2013 11:07    Assessment:  1 Stage 3 CKD 2 Coronary Artery Disease Rec:: 1 Usual precautions to include avoidance of dye if possible, but if not then limit contrast load, stage procedures if necessary, hold ARB as you are doing and hydrate if cardiovascular status amenable to this.  Edithe Dobbin C 09/13/2013, 3:26 PM

## 2013-09-13 NOTE — ED Notes (Signed)
Patient transported to X-ray 

## 2013-09-13 NOTE — H&P (Signed)
History and Physical   Admit date: 09/13/2013 Name:  Dalton Mitchell Medical record number: [id number] DOB/Age:  Feb 27, 1946  67 y.o. male  Referring Physician:   [facility]  Primary Physician:   Dr. Lutricia Feil  Chief complaint/reason for admission: Chest pain  HPI:  This 67 year old male has a history of coronary artery disease. He had a previous inferior infarction in 1995 and had stenting of his right coronary artery. He had residual disease and has seen me for the past several years and has been asymptomatic. His last myocardial perfusion scan was 2 years ago which time he had an ejection fraction of 48% and no ischemia. One month ago he developed exertional substernal chest discomfort after taking the trash cans down to the streets suggestive of his previous angina. He was in his usual state of health since then and [facility] yesterday when he again had recurrent chest discomfort described as a deep, pressure-like and occurring at rest similar to his previous angina. He took the trash cans down to the street last night and had recurrent chest discomfort and had 2 episodes during the night occurring at rest. He had another episode this morning for which he took aspirin for and came to the emergency room. EKG and initial troponin were unremarkable. He is currently symptom-free and started on intravenous heparin. He denies PND, orthopnea or edema and has no claudication. He does have chronic kidney disease and is under the care of the nephrologists. It was attributed to the use of nonsteroidal anti-inflammatory several years ago.    Past Medical History  Diagnosis Date  . Adenomatous colon polyp   . Old inferior wall myocardial infarction   . Sleep apnea   . Hypothyroidism   . Hyperlipidemia   . Gout   . Hypertensive heart disease   . CAD     Prior inferior MI treated with stent 1995    . GERD 02/24/2008  . 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 History  of kidney stones   . Obesity (BMI 30-39.9)     Past Surgical History  Procedure Laterality Date  . Coronary angioplasty with stent placement  1996  . Coronary angioplasty  1995  . Colonoscopy     Allergies: is allergic to ace inhibitors.   Medications: Prior to Admission medications   Medication Sig Start Date End Date Taking? Authorizing Provider  allopurinol (ZYLOPRIM) 300 MG tablet Take 300 mg by mouth daily.     Yes Historical Provider, MD  ALPRAZolam Duanne Moron) 0.5 MG tablet Take 0.5 mg by mouth at bedtime as needed for sleep.   Yes Historical Provider, MD  amLODipine (NORVASC) 5 MG tablet Take 5 mg by mouth daily.     Yes Historical Provider, MD  aspirin EC 81 MG tablet Take 81 mg by mouth daily.   Yes Historical Provider, MD  atorvastatin (LIPITOR) 10 MG tablet Take 10 mg by mouth at bedtime.   Yes Historical Provider, MD  famotidine (PEPCID AC) 10 MG chewable tablet Chew 10 mg by mouth daily.   Yes Historical Provider, MD  fluticasone (FLONASE) 50 MCG/ACT nasal spray Place 2 sprays into both nostrils at bedtime.   Yes Historical Provider, MD  glucosamine-chondroitin 500-400 MG tablet Take 1 tablet by mouth 2 (two) times daily.   Yes Historical Provider, MD  HYDROcodone-acetaminophen (NORCO/VICODIN) 5-325 MG per tablet Take 1 tablet by mouth every 6 (six) hours as needed for moderate pain.   Yes Historical  Provider, MD  levothyroxine (SYNTHROID, LEVOTHROID) 75 MCG tablet Take 75 mcg by mouth daily.     Yes Historical Provider, MD  losartan (COZAAR) 50 MG tablet Take 1 tablet by mouth daily.  05/10/11  Yes Historical Provider, MD  metoprolol (LOPRESSOR) 50 MG tablet Take 50 mg by mouth 2 (two) times daily.   Yes Historical Provider, MD  Multiple Vitamins-Minerals (MULTIVITAMIN WITH MINERALS) tablet Take 1 tablet by mouth daily.     Yes Historical Provider, MD  zolpidem (AMBIEN) 10 MG tablet Take 10 mg by mouth at bedtime.    Yes Historical Provider, MD    Family History:  Family  Status  Relation Status Death Age  . Father Deceased 36    died of CVA  . Mother    . Brother Alive   . Brother Alive     Social History:   reports that he has quit smoking. He has never used smokeless tobacco. He reports that he does not drink alcohol or use illicit drugs.   History   Social History Narrative  . No narrative on file     Review of Systems:  Is been obese for many years. He has had some mild blurred vision. He has had an issue with recurrent cough. No GI problems. Occasional nocturia. 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 Estanislado Pandy Other than as noted above, the remainder of the review of systems is normal  Physical Exam: BP 124/71  Pulse 60  Temp(Src) 97.7 F (36.5 C) (Oral)  Resp 14  Ht 5\' 9"  (1.753 m)  Wt 111.585 kg (246 lb)  BMI 36.31 kg/m2  SpO2 97% General appearance: Pleasant obese white male in no acute distress Head: Normocephalic, without obvious abnormality, atraumatic Eyes: conjunctivae/corneas clear. PERRL, EOM's intact. Fundi not examined  Neck: no adenopathy, no carotid bruit, no JVD and supple, symmetrical, trachea midline Lungs: clear to auscultation bilaterally Heart: regular rate and rhythm, S1, S2 normal, no murmur, click, rub or gallop Abdomen: soft, non-tender; bowel sounds normal; no masses,  no organomegaly Rectal: deferred Extremities: extremities normal, atraumatic, no cyanosis or edema Pulses: 2+ and symmetric femoral, pulse is in the left foot were 2+, the right foot was 1+. Neurologic: Grossly normal  Labs: CBC  Recent Labs  09/13/13 1040  WBC 6.6  RBC 5.09  HGB 16.5  HCT 48.4  PLT 178  MCV 95.1  MCH 32.4  MCHC 34.1  RDW 13.5  LYMPHSABS 1.6  MONOABS 0.8  EOSABS 0.1  BASOSABS 0.0   CMP   Recent Labs  09/13/13 1040  NA 139  K 4.1  CL 105  CO2 22  GLUCOSE 98  BUN 30*  CREATININE 1.93*  CALCIUM 9.6  PROT 7.6  ALBUMIN 4.1  AST 20  ALT 23  ALKPHOS 57  BILITOT 0.4  GFRNONAA 35*   GFRAA 40*     Cardiac Panel (last 3 results)  Recent Labs  09/13/13 1040  TROPONINI <0.30   EKG: Normal EKG, no acute ischemic changes  Radiology: Heart size is upper limits of normal   IMPRESSIONS: 1. Unstable angina pectoris 2. Coronary artery disease with previous stents and previous inferior infarction 3. Stage IV chronic kidney disease 4. Hypertension currently controlled 5. Hyperlipidemia 6. Obesity  PLAN: Admit to telemetry. Intravenous heparin. Renal consultation. Obtain echocardiogram to evaluate LV function. Likely because of unstable angina will need cardiac catheterization but is at increased risk of renal complications from contrast.  Signed: W. Doristine Church. MD  [facility]  09/13/2013, 1:21 PM

## 2013-09-14 ENCOUNTER — Inpatient Hospital Stay (HOSPITAL_COMMUNITY): Payer: Medicare Other

## 2013-09-14 ENCOUNTER — Encounter (HOSPITAL_COMMUNITY)
Admission: EM | Disposition: A | Discharge status: Home / Self Care | Payer: Self-pay | Source: Home / Self Care | Attending: Thoracic Surgery (Cardiothoracic Vascular Surgery)

## 2013-09-14 ENCOUNTER — Other Ambulatory Visit: Payer: Self-pay | Admitting: *Deleted

## 2013-09-14 DIAGNOSIS — I2510 Atherosclerotic heart disease of native coronary artery without angina pectoris: Secondary | ICD-10-CM

## 2013-09-14 HISTORY — PX: LEFT HEART CATHETERIZATION WITH CORONARY ANGIOGRAM: SHX5451

## 2013-09-14 LAB — CBC
Hemoglobin: 15.8 g/dL (ref 13.0–17.0)
MCHC: 33.4 g/dL (ref 30.0–36.0)
MCV: 94 fL (ref 78.0–100.0)
Platelets: 176 10*3/uL (ref 150–400)
RDW: 13.6 % (ref 11.5–15.5)

## 2013-09-14 LAB — BASIC METABOLIC PANEL
CO2: 24 mEq/L (ref 19–32)
Calcium: 9 mg/dL (ref 8.4–10.5)
Creatinine, Ser: 1.91 mg/dL — ABNORMAL HIGH (ref 0.50–1.35)
GFR calc Af Amer: 40 mL/min — ABNORMAL LOW (ref >90)
GFR calc non Af Amer: 35 mL/min — ABNORMAL LOW (ref >90)
Glucose, Bld: 105 mg/dL — ABNORMAL HIGH (ref 70–99)
Potassium: 4 mEq/L (ref 3.5–5.1)
Sodium: 143 mEq/L (ref 135–145)

## 2013-09-14 LAB — PULMONARY FUNCTION TEST
DL/VA % pred: 129 %
DL/VA: 5.88 ml/min/mmHg/L
FEF 25-75 Pre: 3.61 L/sec
FEF2575-%Pred-Pre: 141 %
FEV1-%Pred-Pre: 91 %
FEV1-Pre: 2.99 L
FEV1FVC-%Pred-Pre: 113 %
FEV6-Pre: 3.57 L
FVC-Pre: 3.57 L
Pre FEV1/FVC ratio: 84 %
RV % pred: 61 %
RV: 1.43 L
TLC % pred: 74 %
TLC: 5.1 L

## 2013-09-14 LAB — TYPE AND SCREEN: Antibody Screen: NEGATIVE

## 2013-09-14 LAB — ABO/RH: ABO/RH(D): A POS

## 2013-09-14 LAB — HEPARIN LEVEL (UNFRACTIONATED)
Heparin Unfractionated: 0.7 IU/mL (ref 0.30–0.70)
Heparin Unfractionated: 0.38 IU/mL (ref 0.30–0.70)

## 2013-09-14 LAB — POCT ACTIVATED CLOTTING TIME: Activated Clotting Time: 127 seconds

## 2013-09-14 LAB — TROPONIN I: Troponin I: <0.3 ng/mL (ref <0.30)

## 2013-09-14 SURGERY — LEFT HEART CATHETERIZATION WITH CORONARY ANGIOGRAM
Anesthesia: LOCAL

## 2013-09-14 MED ORDER — ACETAMINOPHEN 325 MG PO TABS: 650.0000 mg | ORAL_TABLET | ORAL | Status: DC | PRN

## 2013-09-14 MED ORDER — HEPARIN (PORCINE) IN NACL 100-0.45 UNIT/ML-% IJ SOLN
1300.0000 [IU]/h | INTRAMUSCULAR | Status: DC
  Administered 2013-09-14 – 2013-09-15 (×2): 1300 [IU]/h via INTRAVENOUS
  Filled 2013-09-14 (×4): qty 250

## 2013-09-14 MED ORDER — LIDOCAINE HCL (PF) 1 % IJ SOLN
INTRAMUSCULAR | Status: AC
  Filled 2013-09-14: qty 30

## 2013-09-14 MED ORDER — HEPARIN (PORCINE) IN NACL 2-0.9 UNIT/ML-% IJ SOLN
INTRAMUSCULAR | Status: AC
  Filled 2013-09-14: qty 1000

## 2013-09-14 MED ORDER — SODIUM CHLORIDE 0.9 % IV SOLN
1.0000 mL/kg/h | INTRAVENOUS | Status: DC
  Administered 2013-09-14 (×2): 1 mL/kg/h via INTRAVENOUS

## 2013-09-14 MED ORDER — NITROGLYCERIN 0.2 MG/ML ON CALL CATH LAB
INTRAVENOUS | Status: AC
  Filled 2013-09-14: qty 1

## 2013-09-14 MED ORDER — ALPRAZOLAM 0.25 MG PO TABS
0.2500 mg | ORAL_TABLET | ORAL | Status: DC | PRN
Start: 2013-09-14 — End: 2013-09-17

## 2013-09-14 NOTE — Interval H&P Note (Signed)
History and Physical Interval Note:  09/14/2013 7:44 AM    Patient seen and examined.  No interval change in history and exam since last note.  Stable for procedure.  Kerry Hough. MD [facility]  09/14/2013

## 2013-09-14 NOTE — Progress Notes (Signed)
After bedrest completed, patient ambulated in room and groin site level 0.  Dressing removed and band aid applied.  Will continue to monitor. [place], Ardeth Sportsman

## 2013-09-14 NOTE — H&P (View-Only) (Signed)
No recurrent pain.  Appreciate nephrology note.  ECHO done earlier.  Will plan cath with limited contrast inlight of unstable angina and progressive symptoms. Cardiac catheterization was discussed with the patient fully including risks of myocardial infarction, death, stroke, bleeding, arrhythmia, dye allergy, renal insufficiency or bleeding.  The patient understands and is willing to proceed. Probable stage procedure if needed.  Kerry Hough MD [facility]

## 2013-09-14 NOTE — Progress Notes (Signed)
[id number] Cardiac Rehab Pt sleeping on arrival. His wife states that he does not sleep well at home. Did pre-op education with pt's wife. I gave her heart surgery booklet and showed her how to get on surgery video. I left IS for pt to use and explained our role in his care post-op. We will follow pt tomorrow, discuss pre-op education with him and ambulate if he is pain free.Deon Pilling, RN 09/14/2013 3:04 PM

## 2013-09-14 NOTE — Consult Note (Signed)
Reason for Consult: Severe 3 vessel CAD Referring Physician: Dr. Carlean Purl is an 67 y.o. male.  HPI: 67 yo semi-retired male with a history of CAD dating back to 103. He presents with a cc/o chest pain. He had an inferior MI in 1995 and had PTCA and stenting of his RCA at that time.  He had done well since then until recently. About a month ago he had an episode of chest pain while moving his trash can to the street. He says it is hard to describe the nature of the pain but it is severe and is consistent with his pain when he had the MI in 1995.  The evening PTA he once again took his trash cans to the street and had recurrent pain. He had two more episodes during the night that occurred at rest. The next morning he had another severe episode. He took an aspirin and came to the ED.  His initial EKG and enzymes were unremarkable. He was started on a heparin drip. He did rule out for MI by enzymes. An echocardiogram showed an EF of 50-55%. Today he had cardiac catheterization where he was found to have 3 vessel CAD.   He currently is pain free.   Past Medical History  Diagnosis Date  . Adenomatous colon polyp   . Old inferior wall myocardial infarction   . Sleep apnea   . Hypothyroidism   . Hyperlipidemia   . Gout   . Hypertensive heart disease   . CAD     Prior inferior MI treated with stent 1995    . GERD 02/24/2008  . 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 History of kidney stones   . Obesity (BMI 30-39.9)     Past Surgical History  Procedure Laterality Date  . Coronary angioplasty with stent placement  1996  . Coronary angioplasty  1995  . Colonoscopy      Family History: Father- CVA age 46  Social History:  reports that he has quit smoking. He has never used smokeless tobacco. He reports that he does not drink alcohol or use illicit drugs. Smoked in his 63s  Allergies:  Allergies  Allergen Reactions  . Ace Inhibitors Cough     Medications:  Prior to Admission:  Prescriptions prior to admission  Medication Sig Dispense Refill  . allopurinol (ZYLOPRIM) 300 MG tablet Take 300 mg by mouth daily.        [name] ALPRAZolam (XANAX) 0.5 MG tablet Take 0.5 mg by mouth at bedtime as needed for sleep.      [name] amLODipine (NORVASC) 5 MG tablet Take 5 mg by mouth daily.        [name] aspirin EC 81 MG tablet Take 81 mg by mouth daily.      [name] atorvastatin (LIPITOR) 10 MG tablet Take 10 mg by mouth at bedtime.      . famotidine (PEPCID AC) 10 MG chewable tablet Chew 10 mg by mouth daily.      . fluticasone (FLONASE) 50 MCG/ACT nasal spray Place 2 sprays into both nostrils at bedtime.      [name] glucosamine-chondroitin 500-400 MG tablet Take 1 tablet by mouth 2 (two) times daily.      [name] HYDROcodone-acetaminophen (NORCO/VICODIN) 5-325 MG per tablet Take 1 tablet by mouth every 6 (six) hours as needed for moderate pain.      [name] levothyroxine (SYNTHROID, LEVOTHROID) 75 MCG tablet Take 75 mcg by mouth daily.        [name]  losartan (COZAAR) 50 MG tablet Take 1 tablet by mouth daily.       . metoprolol (LOPRESSOR) 50 MG tablet Take 50 mg by mouth 2 (two) times daily.      . Multiple Vitamins-Minerals (MULTIVITAMIN WITH MINERALS) tablet Take 1 tablet by mouth daily.        [name] zolpidem (AMBIEN) 10 MG tablet Take 10 mg by mouth at bedtime.         Results for orders placed during the hospital encounter of 09/13/13 (from the past 48 hour(s))  CBC WITH DIFFERENTIAL     Status: None   Collection Time    09/13/13 10:40 AM      Result Value Range   WBC 6.6  4.0 - 10.5 K/uL   RBC 5.09  4.22 - 5.81 MIL/uL   Hemoglobin 16.5  13.0 - 17.0 g/dL   HCT 48.4  39.0 - 52.0 %   MCV 95.1  78.0 - 100.0 fL   MCH 32.4  26.0 - 34.0 pg   MCHC 34.1  30.0 - 36.0 g/dL   RDW 13.5  11.5 - 15.5 %   Platelets 178  150 - 400 K/uL   Neutrophils Relative % 62  43 - 77 %   Neutro Abs 4.1  1.7 - 7.7 K/uL   Lymphocytes Relative 24  12 - 46 %   Lymphs Abs 1.6  0.7 - 4.0 K/uL    Monocytes Relative 12  3 - 12 %   Monocytes Absolute 0.8  0.1 - 1.0 K/uL   Eosinophils Relative 2  0 - 5 %   Eosinophils Absolute 0.1  0.0 - 0.7 K/uL   Basophils Relative 0  0 - 1 %   Basophils Absolute 0.0  0.0 - 0.1 K/uL  COMPREHENSIVE METABOLIC PANEL     Status: Abnormal   Collection Time    09/13/13 10:40 AM      Result Value Range   Sodium 139  135 - 145 mEq/L   Potassium 4.1  3.5 - 5.1 mEq/L   Chloride 105  96 - 112 mEq/L   CO2 22  19 - 32 mEq/L   Glucose, Bld 98  70 - 99 mg/dL   BUN 30 (*) 6 - 23 mg/dL   Creatinine, Ser 1.93 (*) 0.50 - 1.35 mg/dL   Calcium 9.6  8.4 - 10.5 mg/dL   Total Protein 7.6  6.0 - 8.3 g/dL   Albumin 4.1  3.5 - 5.2 g/dL   AST 20  0 - 37 U/L   ALT 23  0 - 53 U/L   Alkaline Phosphatase 57  39 - 117 U/L   Total Bilirubin 0.4  0.3 - 1.2 mg/dL   GFR calc non Af Amer 35 (*) >90 mL/min   GFR calc Af Amer 40 (*) >90 mL/min   Comment: (NOTE)     The eGFR has been calculated using the CKD EPI equation.     This calculation has not been validated in all clinical situations.     eGFR's persistently <90 mL/min signify possible Chronic Kidney     Disease.  TROPONIN I     Status: None   Collection Time    09/13/13 10:40 AM      Result Value Range   Troponin I <0.30  <0.30 ng/mL   Comment:            Due to the release kinetics of cTnI,     a negative result within the first hours  of the onset of symptoms does not rule out     myocardial infarction with certainty.     If myocardial infarction is still suspected,     repeat the test at appropriate intervals.  APTT     Status: None   Collection Time    09/13/13 10:40 AM      Result Value Range   aPTT 35  24 - 37 seconds  PROTIME-INR     Status: None   Collection Time    09/13/13 10:40 AM      Result Value Range   Prothrombin Time 13.1  11.6 - 15.2 seconds   INR 1.01  0.00 - 1.49  URINALYSIS, ROUTINE W REFLEX MICROSCOPIC     Status: Abnormal   Collection Time    09/13/13 11:42 AM      Result  Value Range   Color, Urine YELLOW  YELLOW   APPearance CLEAR  CLEAR   Specific Gravity, Urine 1.006  1.005 - 1.030   pH 5.5  5.0 - 8.0   Glucose, UA NEGATIVE  NEGATIVE mg/dL   Hgb urine dipstick SMALL (*) NEGATIVE   Bilirubin Urine NEGATIVE  NEGATIVE   Ketones, ur NEGATIVE  NEGATIVE mg/dL   Protein, ur 100 (*) NEGATIVE mg/dL   Urobilinogen, UA 0.2  0.0 - 1.0 mg/dL   Nitrite NEGATIVE  NEGATIVE   Leukocytes, UA NEGATIVE  NEGATIVE  URINE MICROSCOPIC-ADD ON     Status: None   Collection Time    09/13/13 11:42 AM      Result Value Range   RBC / HPF 0-2  <3 RBC/hpf  PROTIME-INR     Status: None   Collection Time    09/13/13 12:52 PM      Result Value Range   Prothrombin Time 13.3  11.6 - 15.2 seconds   INR 1.03  0.00 - 1.49  APTT     Status: Abnormal   Collection Time    09/13/13 12:52 PM      Result Value Range   aPTT >200 (*) 24 - 37 seconds   Comment:            IF BASELINE aPTT IS ELEVATED,     SUGGEST PATIENT RISK ASSESSMENT     BE USED TO DETERMINE APPROPRIATE     ANTICOAGULANT THERAPY.     REPEATED TO VERIFY     CRITICAL RESULT CALLED TO, READ BACK BY AND VERIFIED WITH:     Jaymes Graff (RN) [id number] 09/13/2013 L. LOMAX  TSH     Status: None   Collection Time    09/13/13 12:52 PM      Result Value Range   TSH 3.053  0.350 - 4.500 uIU/mL   Comment: Performed at [place]     Status: Abnormal   Collection Time    09/13/13 12:52 PM      Result Value Range   Sodium 141  135 - 145 mEq/L   Potassium 4.4  3.5 - 5.1 mEq/L   Chloride 105  96 - 112 mEq/L   CO2 24  19 - 32 mEq/L   Glucose, Bld 92  70 - 99 mg/dL   BUN 29 (*) 6 - 23 mg/dL   Creatinine, Ser 1.93 (*) 0.50 - 1.35 mg/dL   Calcium 9.4  8.4 - 10.5 mg/dL   Total Protein 7.6  6.0 - 8.3 g/dL   Albumin 4.2  3.5 - 5.2 g/dL   AST 21  0 -  37 U/L   ALT 20  0 - 53 U/L   Alkaline Phosphatase 57  39 - 117 U/L   Total Bilirubin 0.4  0.3 - 1.2 mg/dL   GFR calc non Af Amer 35 (*) >90  mL/min   GFR calc Af Amer 40 (*) >90 mL/min   Comment: (NOTE)     The eGFR has been calculated using the CKD EPI equation.     This calculation has not been validated in all clinical situations.     eGFR's persistently <90 mL/min signify possible Chronic Kidney     Disease.  TROPONIN I     Status: None   Collection Time    09/13/13 12:54 PM      Result Value Range   Troponin I <0.30  <0.30 ng/mL   Comment:            Due to the release kinetics of cTnI,     a negative result within the first hours     of the onset of symptoms does not rule out     myocardial infarction with certainty.     If myocardial infarction is still suspected,     repeat the test at appropriate intervals.  HEPARIN LEVEL (UNFRACTIONATED)     Status: None   Collection Time    09/13/13  6:00 PM      Result Value Range   Heparin Unfractionated 0.50  0.30 - 0.70 IU/mL   Comment:            IF HEPARIN RESULTS ARE BELOW     EXPECTED VALUES, AND PATIENT     DOSAGE HAS BEEN CONFIRMED,     SUGGEST FOLLOW UP TESTING     OF ANTITHROMBIN III LEVELS.  TROPONIN I     Status: None   Collection Time    09/13/13  6:00 PM      Result Value Range   Troponin I <0.30  <0.30 ng/mL   Comment:            Due to the release kinetics of cTnI,     a negative result within the first hours     of the onset of symptoms does not rule out     myocardial infarction with certainty.     If myocardial infarction is still suspected,     repeat the test at appropriate intervals.  TROPONIN I     Status: None   Collection Time    09/14/13 12:20 AM      Result Value Range   Troponin I <0.30  <0.30 ng/mL   Comment:            Due to the release kinetics of cTnI,     a negative result within the first hours     of the onset of symptoms does not rule out     myocardial infarction with certainty.     If myocardial infarction is still suspected,     repeat the test at appropriate intervals.  HEPARIN LEVEL (UNFRACTIONATED)     Status:  None   Collection Time    09/14/13  6:50 AM      Result Value Range   Heparin Unfractionated 0.70  0.30 - 0.70 IU/mL   Comment:            IF HEPARIN RESULTS ARE BELOW     EXPECTED VALUES, AND PATIENT     DOSAGE HAS BEEN CONFIRMED,     SUGGEST FOLLOW UP TESTING  OF ANTITHROMBIN III LEVELS.  CBC     Status: None   Collection Time    09/14/13  6:50 AM      Result Value Range   WBC 6.0  4.0 - 10.5 K/uL   RBC 5.03  4.22 - 5.81 MIL/uL   Hemoglobin 15.8  13.0 - 17.0 g/dL   HCT 47.3  39.0 - 52.0 %   MCV 94.0  78.0 - 100.0 fL   MCH 31.4  26.0 - 34.0 pg   MCHC 33.4  30.0 - 36.0 g/dL   RDW 13.6  11.5 - 15.5 %   Platelets 176  150 - 400 K/uL  BASIC METABOLIC PANEL     Status: Abnormal   Collection Time    09/14/13  6:50 AM      Result Value Range   Sodium 143  135 - 145 mEq/L   Potassium 4.0  3.5 - 5.1 mEq/L   Chloride 109  96 - 112 mEq/L   CO2 24  19 - 32 mEq/L   Glucose, Bld 105 (*) 70 - 99 mg/dL   BUN 28 (*) 6 - 23 mg/dL   Creatinine, Ser 1.91 (*) 0.50 - 1.35 mg/dL   Calcium 9.0  8.4 - 10.5 mg/dL   GFR calc non Af Amer 35 (*) >90 mL/min   GFR calc Af Amer 40 (*) >90 mL/min   Comment: (NOTE)     The eGFR has been calculated using the CKD EPI equation.     This calculation has not been validated in all clinical situations.     eGFR's persistently <90 mL/min signify possible Chronic Kidney     Disease.    Dg Chest 2 View  09/13/2013   CLINICAL DATA:  Mid left-sided chest pain with tingling down arm.  EXAM: CHEST  2 VIEW  COMPARISON:  03/03/2010  FINDINGS: Cardiac silhouette is upper limits of normal in size, unchanged. Thoracic aorta remains moderately tortuous. The lungs are well inflated and clear. There is no evidence of pleural effusion or pneumothorax. Multilevel osteophytosis is noted in the thoracic spine.  IMPRESSION: No evidence of active cardiopulmonary disease.   Electronically Signed   By: Logan Bores   On: 09/13/2013 11:07    Review of Systems  Constitutional:  Negative for fever and chills.       Obese  Eyes: Positive for blurred vision (chronic).  Cardiovascular: Positive for chest pain.  Genitourinary:       Nocturia  Musculoskeletal: Positive for joint pain.  All other systems reviewed and are negative.   Blood pressure 127/79, pulse 57, temperature 98.2 F (36.8 C), temperature source Oral, resp. rate 18, height 5\' 9"  (1.753 m), weight 247 lb (112.038 kg), SpO2 95.00%. Physical Exam  Vitals reviewed. Constitutional: He is oriented to person, place, and time. No distress.  obese  HENT:  Head: Normocephalic and atraumatic.  Eyes: EOM are normal. Pupils are equal, round, and reactive to light.  Neck: Neck supple.  No carotid bruits  Cardiovascular: Normal rate, normal heart sounds and intact distal pulses.  Exam reveals no gallop and no friction rub.   No murmur heard. Respiratory: Effort normal. He has no wheezes. He has no rales.  GI: Soft. There is no tenderness.  Musculoskeletal: He exhibits no edema.  Lymphadenopathy:    He has no cervical adenopathy.  Neurological: He is alert and oriented to person, place, and time. No cranial nerve deficit.  No focal motor deficit  Skin: Skin is warm and  dry.   CARDIAC CATHETERIZATION  IMPRESSIONS:  1. 3 vessel coronary artery disease in a patient with unstable angina with occlusion of the right coronary artery, severe stenosis in the marginal branch and moderately severe LAD stenosis 2. Normal left ventricle by echocardiogram (50-55%)   ECHOCARDIOGRAM  Study Conclusions  - Left ventricle: The cavity size was normal. Wall thickness was increased in a pattern of mild LVH. Systolic function was normal. The estimated ejection fraction was in the range of 50% to 55%. Wall motion was normal; there were no regional wall motion abnormalities. Doppler parameters are consistent with abnormal left ventricular relaxation (grade 1 diastolic dysfunction). - Left atrium: The atrium was mildly  dilated.   Assessment/Plan: 67 yo man with multiple CRF and a history of CAD presents with unstable angina. He has severe 3 vessel CAD and only mild LV dysfunction(EF 50%). CABG is indicated for survival benefit and relief of symptoms.  I discussed the indications, risks, benefits and alternative treatments with Mr. And Mrs. Aquilino. They understand the general nature of the procedure, the need for general anesthesia, and the incisions to be used. We discussed the expected hospital stay, overall recovery and short and long term outcomes. They understand the risks include, but are not limited to death, stroke, MI, DVT/PE, bleeding, possible need for transfusion, infections, cardiac arrhythmias, and other organ system dysfunction including respiratory, renal, or GI complications. They understand he is at increased risk for renal complications given his CKD. They accept the risks and agree to proceed.  For CABG Monday December 15  HENDRICKSON,STEVEN C 09/14/2013, 2:15 PM

## 2013-09-14 NOTE — Progress Notes (Signed)
Patient stable after catheterization. Discussed bypass grafting with him. I reviewed his catheterization films with Dr. Tamala Julian who felt that he would be better suited for bypass for more complete revascularization. Obtain followup renal function over weekend. The patient will need to stay in the hospital in light of resting angina on intravenous heparin until surgery on Monday.  Kerry Hough MD [facility]

## 2013-09-14 NOTE — Progress Notes (Signed)
ANTICOAGULATION CONSULT NOTE - Follow Up Consult  Pharmacy Consult for Heparin Indication: chest pain/ACS  Allergies  Allergen Reactions  . Ace Inhibitors Cough    Patient Measurements: Height: 5\' 9"  (175.3 cm) Weight: 247 lb (112.038 kg) IBW/kg (Calculated) : 70.7 Heparin Dosing Weight: 95.5 kg  Vital Signs: Pulse Rate: 61 (12/12 0732)  Labs:  Recent Labs  09/13/13 1040 09/13/13 1252 09/13/13 1254 09/13/13 1800 09/14/13 0020 09/14/13 0650  HGB 16.5  --   --   --   --  15.8  HCT 48.4  --   --   --   --  47.3  PLT 178  --   --   --   --  176  APTT 35 >200*  --   --   --   --   LABPROT 13.1 13.3  --   --   --   --   INR 1.01 1.03  --   --   --   --   HEPARINUNFRC  --   --   --  0.50  --  0.70  CREATININE 1.93* 1.93*  --   --   --  1.91*  TROPONINI <0.30  --  <0.30 <0.30 <0.30  --     Estimated Creatinine Clearance: 46.9 ml/min (by C-G formula based on Cr of 1.91).   Medications:  Infusions:  . sodium chloride    . heparin 1,300 Units/hr (09/13/13 1231)    Assessment: 67 year old male s/p cardiac cath this morning.  He will be evaluated for possible CABG and his heparin is to restart 8 hours post sheath removal.  His sheath was removed and compression stopped at 0850 this morning.  He was previously therapeutic on a Heparin rate of 1300 units/hr.  Goal of Therapy:  Heparin level 0.3-0.7 units/ml Monitor platelets by anticoagulation protocol: Yes   Plan:  At 1700 today, restart Heparin at 1300 units/hr Check heparin level 6 hours after restarting Heparin. Daily Heparin level and CBC  Legrand Como, Pharm.D., BCPS, AAHIVP Clinical Pharmacist Phone: [phone] or [phone] 09/14/2013, 9:53 AM

## 2013-09-14 NOTE — Progress Notes (Signed)
Admit: 09/13/2013 LOS: 1  75M CKD3 (BL 1.9) w/ [place] and 3V disease for CABG 09/17/13  Subjective:  PCI today with 3V disease Current plan for CABG on Monday Good UOP.  Feels well. Breathing well  Filed Weights   09/13/13 1128 09/13/13 1432  Weight: 111.585 kg (246 lb) 112.038 kg (247 lb)    Current meds: reviewed, ARB held at admission.  Current Labs: reviewed    Physical Exam:  Blood pressure 127/79, pulse 57, temperature 98.2 F (36.8 C), temperature source Oral, resp. rate 18, height 5\' 9"  (1.753 m), weight 112.038 kg (247 lb), SpO2 95.00%. NAD RRR, nl s1s2 CTAB, nlwob S/nt/nd No LEE Nonfocal No rashes No foley in place MMM, NCAT EOMI, sclera clear  Assessment/Plan 1. CKD3: Stable.  Fllow over weekend post cath.  Discussed risk of AKI including dialysis short or long term with pt in post-CABG period and he understands.  Has had > 6h of IVFs post cath, voiding well.  Will hold now.  Pt ad lib PO.    Pearson Grippe MD 09/14/2013, 4:55 PM   Recent Labs Lab 09/13/13 1040 09/13/13 1252 09/14/13 0650  NA 139 141 143  K 4.1 4.4 4.0  CL 105 105 109  CO2 22 24 24   GLUCOSE 98 92 105*  BUN 30* 29* 28*  CREATININE 1.93* 1.93* 1.91*  CALCIUM 9.6 9.4 9.0    Recent Labs Lab 09/13/13 1040 09/14/13 0650  WBC 6.6 6.0  NEUTROABS 4.1  --   HGB 16.5 15.8  HCT 48.4 47.3  MCV 95.1 94.0  PLT 178 176    Current Facility-Administered Medications  Medication Dose Route Frequency Provider Last Rate Last Dose  . 0.9 %  sodium chloride infusion  250 mL Intravenous PRN Jacolyn Reedy, MD      . 0.9 %  sodium chloride infusion  1 mL/kg/hr Intravenous Continuous Jacolyn Reedy, MD 112 mL/hr at 09/14/13 1314 1 mL/kg/hr at 09/14/13 1314  . acetaminophen (TYLENOL) tablet 650 mg  650 mg Oral Q4H PRN Jacolyn Reedy, MD      . allopurinol (ZYLOPRIM) tablet 300 mg  300 mg Oral Daily Jacolyn Reedy, MD   300 mg at 09/14/13 1007  . ALPRAZolam Duanne Moron) tablet 0.25-0.5 mg   0.25-0.5 mg Oral Q4H PRN Melrose Nakayama, MD      . ALPRAZolam Duanne Moron) tablet 0.5 mg  0.5 mg Oral QHS PRN Jacolyn Reedy, MD      . amLODipine (NORVASC) tablet 5 mg  5 mg Oral Daily Jacolyn Reedy, MD   5 mg at 09/14/13 1007  . [START ON 09/15/2013] aspirin EC tablet 81 mg  81 mg Oral Daily Jacolyn Reedy, MD      . atorvastatin (LIPITOR) tablet 10 mg  10 mg Oral QHS Jacolyn Reedy, MD   10 mg at 09/13/13 2151  . famotidine (PEPCID) tablet 10 mg  10 mg Oral Daily Jacolyn Reedy, MD   10 mg at 09/14/13 1007  . fluticasone (FLONASE) 50 MCG/ACT nasal spray 2 spray  2 spray Each Nare QHS Jacolyn Reedy, MD   2 spray at 09/13/13 2152  . heparin ADULT infusion 100 units/mL (25000 units/250 mL)  1,300 Units/hr Intravenous Continuous Melrose Nakayama, MD      . HYDROcodone-acetaminophen (NORCO/VICODIN) 5-325 MG per tablet 1 tablet  1 tablet Oral Q6H PRN Jacolyn Reedy, MD      . levothyroxine (SYNTHROID, LEVOTHROID) tablet 75 mcg  75 mcg  Oral QAC breakfast Jacolyn Reedy, MD   75 mcg at 09/14/13 1007  . metoprolol tartrate (LOPRESSOR) tablet 50 mg  50 mg Oral BID Jacolyn Reedy, MD   50 mg at 09/14/13 1007  . nitroGLYCERIN (NITROSTAT) SL tablet 0.4 mg  0.4 mg Sublingual Q5 Min x 3 PRN Jacolyn Reedy, MD      . sodium chloride 0.9 % injection 3 mL  3 mL Intravenous Q12H Jacolyn Reedy, MD      . sodium chloride 0.9 % injection 3 mL  3 mL Intravenous PRN Jacolyn Reedy, MD      . zolpidem [facility]) tablet 10 mg  10 mg Oral QHS Jacolyn Reedy, MD   10 mg at 09/13/13 2325

## 2013-09-14 NOTE — Progress Notes (Signed)
Utilization review completed.  

## 2013-09-14 NOTE — CV Procedure (Signed)
Cardiac Catheterization Report   Dalton Mitchell    67 y.o.  male  DOB: 27-May-1946  MRN: [id number]  09/14/2013    PROCEDURE:  Left heart catheterization with selective coronary angiography, measurement of left ventricular pressures but no ventriculogram  INDICATIONS:  Unstable angina pectoris  The risks, benefits, and details of the procedure were explained to the patient.  The patient verbalized understanding and wanted to proceed.  Informed written consent was obtained.  PROCEDURE TECHNIQUE:  After Xylocaine anesthesia a 15F sheath was placed in the right femoral artery with a single anterior needle wall stick.   Left coronary angiography was done using a Judkins L4 guide catheter.  Right coronary angiography was done using a Judkins R4 guide catheter.  The aortic valve was crossed with a pigtail catheter and the pressure gradient was measured but no ventriculogram was performed.  Tolerated the procedure well.  Sheath removed in the holding area.   CONTRAST:  Total of 40 cc.  COMPLICATIONS:  None.    HEMODYNAMICS:  Aorta post contrast 138/80, left ventricle post contrast.138/2-16.  There was no gradient between the left ventricle and aorta.    ANGIOGRAPHIC DATA:    CORONARY ARTERIES:   Arise and distribute normally. Right dominant. Moderate coronary calcification is noted.  Left main coronary artery: Mild narrowing  Left anterior descending: Moderately calcified. There is a tubular 60-80% stenosis in the proximal portion at the septal perforator with some eccentric narrowing noted. Mild distal disease is noted.  Circumflex coronary artery: Previous stent is seen and is widely patent. There is a eccentric 95% stenosis in a large marginal branch  Right coronary artery: Subtotal stenosis proximally and then is totally occluded and fills by collaterals from the left coronary system. Appears to be a dominant vessel.  LEFT VENTRICULOGRAM:  Not  performed due to renal insufficiency  IMPRESSIONS:  1. 3 vessel coronary artery disease in a patient with unstable angina with occlusion of the right coronary artery, severe stenosis in the marginal branch and moderately severe LAD stenosis 2.  Normal left ventricle by echocardiogram (50-55%)   RECOMMENDATION:  Angiograms will be reviewed by interventional cardiology. He does have three-vessel coronary artery disease with rest angina and unstable angina pectoris. He wanted to be placed back on intravenous heparin and surgery consultation with thoracic surgery will be obtained for consideration of bypass grafting. Dr. Tamala Julian did review the films and felt that the patient would best be served with bypass surgery. Patient does have chronic kidney disease and will need to be hydrated.  Kerry Hough MD [facility]

## 2013-09-15 DIAGNOSIS — I200 Unstable angina: Secondary | ICD-10-CM

## 2013-09-15 DIAGNOSIS — I2510 Atherosclerotic heart disease of native coronary artery without angina pectoris: Principal | ICD-10-CM

## 2013-09-15 DIAGNOSIS — N184 Chronic kidney disease, stage 4 (severe): Secondary | ICD-10-CM

## 2013-09-15 DIAGNOSIS — Z01810 Encounter for preprocedural cardiovascular examination: Secondary | ICD-10-CM

## 2013-09-15 LAB — CBC
Hemoglobin: 16.6 g/dL (ref 13.0–17.0)
MCH: 32.6 pg (ref 26.0–34.0)
MCHC: 34.1 g/dL (ref 30.0–36.0)
Platelets: 162 10*3/uL (ref 150–400)
RBC: 5.09 MIL/uL (ref 4.22–5.81)
WBC: 6.6 10*3/uL (ref 4.0–10.5)

## 2013-09-15 LAB — SURGICAL PCR SCREEN
MRSA, PCR: NEGATIVE
Staphylococcus aureus: NEGATIVE

## 2013-09-15 LAB — BASIC METABOLIC PANEL
Calcium: 9.4 mg/dL (ref 8.4–10.5)
Chloride: 108 mEq/L (ref 96–112)
GFR calc non Af Amer: 38 mL/min — ABNORMAL LOW (ref >90)
Glucose, Bld: 108 mg/dL — ABNORMAL HIGH (ref 70–99)
Potassium: 4.2 mEq/L (ref 3.5–5.1)
Sodium: 140 mEq/L (ref 135–145)

## 2013-09-15 LAB — HEPARIN LEVEL (UNFRACTIONATED): Heparin Unfractionated: 0.51 IU/mL (ref 0.30–0.70)

## 2013-09-15 NOTE — Progress Notes (Signed)
CARDIAC REHAB PHASE I   PRE:  Rate/Rhythm: 62SR  BP:  Supine:   Sitting: 126/82  Standing:    SaO2: 97%RA  MODE:  Ambulation: 550 ft   POST:  Rate/Rhythm: 67 SR  BP:  Supine:   Sitting: 144/84  Standing:    SaO2: 97%RA 1130-1155 Pt walked 550 ft on RA with steady gait. Pt demonstrated getting up and down without using arms. Discussed importance of mobility and use of IS after surgery. Pt can get 2500 ml on IS. Knows that he needs to watch preop video. Discussed sternal precautions and answered pt questions. Will follow up after surgery.   Graylon Good, RN BSN  09/15/2013 11:51 AM

## 2013-09-15 NOTE — Progress Notes (Signed)
ANTICOAGULATION CONSULT NOTE - Follow Up Consult  Pharmacy Consult for Heparin  Indication: chest pain/ACS  Allergies  Allergen Reactions  . Ace Inhibitors Cough    Patient Measurements: Height: 5\' 9"  (175.3 cm) Weight: 247 lb (112.038 kg) IBW/kg (Calculated) : 70.7 Heparin Dosing Weight: ~96 kg  Vital Signs: Temp: 97.6 F (36.4 C) (12/12 2100) Temp src: Oral (12/12 1346) BP: 129/89 mmHg (12/12 2100) Pulse Rate: 63 (12/12 2100)  Labs:  Recent Labs  09/13/13 1040 09/13/13 1252 09/13/13 1254 09/13/13 1800 09/14/13 0020 09/14/13 0650 09/14/13 2310  HGB 16.5  --   --   --   --  15.8  --   HCT 48.4  --   --   --   --  47.3  --   PLT 178  --   --   --   --  176  --   APTT 35 >200*  --   --   --   --   --   LABPROT 13.1 13.3  --   --   --   --   --   INR 1.01 1.03  --   --   --   --   --   HEPARINUNFRC  --   --   --  0.50  --  0.70 0.38  CREATININE 1.93* 1.93*  --   --   --  1.91*  --   TROPONINI <0.30  --  <0.30 <0.30 <0.30  --   --     Estimated Creatinine Clearance: 46.9 ml/min (by C-G formula based on Cr of 1.91).   Medications:  -Heparin 1300 units/hr  Assessment: 67 y/o M s/p cath 12/12 who was restarted on heparin at 1700 on 12/12. First HL since being re-started is 0.38. Was previously therapeutic at this rate.   Goal of Therapy:  Heparin level 0.3-0.7 units/ml Monitor platelets by anticoagulation protocol: Yes   Plan:  -Continue heparin at 1300 units/hr -Confirmatory HL with AM labs -Daily CBC/HL -Monitor for bleeding -For CABG on 12/15  Thank you for allowing me to take part in this patient's care,  Narda Bonds, PharmD Clinical Pharmacist Phone: [phone] Pager: [phone] 09/15/2013 12:25 AM

## 2013-09-15 NOTE — Progress Notes (Signed)
    Subjective:  Feels well. Reviewed Dr. Roxan Hockey note. CABG planned Monday.   Objective:  Vital Signs in the last 24 hours: Temp:  [97.6 F (36.4 C)-98.2 F (36.8 C)] 98 F (36.7 C) (12/13 0500) Pulse Rate:  [57-65] 65 (12/13 0938) Resp:  [18] 18 (12/13 0500) BP: (125-145)/(75-89) 143/80 mmHg (12/13 0937) SpO2:  [94 %-96 %] 94 % (12/13 0500)  Intake/Output from previous day:     Physical Exam: General: Well developed, well nourished, in no acute distress. Head:  Normocephalic and atraumatic. Lungs: Clear to auscultation and percussion. Heart: Normal S1 and S2.  No murmur, rubs or gallops.  Abdomen: soft, non-tender, positive bowel sounds. Extremities: No clubbing or cyanosis. No edema. Neurologic: Alert and oriented x 3.    Lab Results:  Recent Labs  09/14/13 0650 09/15/13 0825  WBC 6.0 6.6  HGB 15.8 16.6  PLT 176 162    Recent Labs  09/14/13 0650 09/15/13 0825  NA 143 140  K 4.0 4.2  CL 109 108  CO2 24 22  GLUCOSE 105* 108*  BUN 28* 25*  CREATININE 1.91* 1.78*    Recent Labs  09/13/13 1800 09/14/13 0020  TROPONINI <0.30 <0.30   Hepatic Function Panel  Recent Labs  09/13/13 1252  PROT 7.6  ALBUMIN 4.2  AST 21  ALT 20  ALKPHOS 57  BILITOT 0.4    Telemetry: NSR Personally viewed.   Cardiac Studies:  3 v CAD  Assessment/Plan:  Principal Problem:   Unstable angina Active Problems:   Hypertensive heart disease   CAD   Chronic kidney disease (CKD), stage IV (severe)   -Await CABG Monday.  -Dr. Joelyn Oms on board with renal. CKD 4. Creat 1.78. No ACE-I -Statin on board. Atorva 10mg .   Dalton Mitchell 09/15/2013, 11:21 AM

## 2013-09-15 NOTE — Progress Notes (Addendum)
Pre-op Cardiac Surgery  Carotid Findings:  1-39% ICA stenosis.  Vertebral artery flow is antegrade.  Upper Extremity Right Left  Brachial Pressures 112 Triphasic 122 Triphasic  Radial Waveforms Triphasic Triphasic  Ulnar Waveforms Triphasic Triphasic  Palmar Arch (Allen's Test) Normal Normal   Findings:  Doppler waveforms remained normal bilaterally with both radial and ulnar compressions.    Lower  Extremity Right Left  Dorsalis Pedis    Anterior Tibial    Posterior Tibial    Ankle/Brachial Indices      Findings:  Bilateral - palpable peal pulses

## 2013-09-15 NOTE — Progress Notes (Signed)
Pt has home unit and will place on when ready. Pt encouraged to call RT if needing any assistance with placement of mask.

## 2013-09-15 NOTE — Progress Notes (Signed)
ANTICOAGULATION CONSULT NOTE - Follow Up Consult  Pharmacy Consult for Heparin  Indication: chest pain/ACS; CABG on Monday   Allergies  Allergen Reactions  . Ace Inhibitors Cough    Patient Measurements: Height: 5\' 9"  (175.3 cm) Weight: 247 lb (112.038 kg) IBW/kg (Calculated) : 70.7 Heparin Dosing Weight: ~96 kg  Vital Signs: Temp: 98 F (36.7 C) (12/13 0500) BP: 143/80 mmHg (12/13 0937) Pulse Rate: 65 (12/13 0938)  Labs:  Recent Labs  09/13/13 1040 09/13/13 1252 09/13/13 1254  09/13/13 1800 09/14/13 0020 09/14/13 0650 09/14/13 2310 09/15/13 0825  HGB 16.5  --   --   --   --   --  15.8  --  16.6  HCT 48.4  --   --   --   --   --  47.3  --  48.7  PLT 178  --   --   --   --   --  176  --  162  APTT 35 >200*  --   --   --   --   --   --   --   LABPROT 13.1 13.3  --   --   --   --   --   --   --   INR 1.01 1.03  --   --   --   --   --   --   --   HEPARINUNFRC  --   --   --   < > 0.50  --  0.70 0.38 0.51  CREATININE 1.93* 1.93*  --   --   --   --  1.91*  --  1.78*  TROPONINI <0.30  --  <0.30  --  <0.30 <0.30  --   --   --   < > = values in this interval not displayed.  Estimated Creatinine Clearance: 50.3 ml/min (by C-G formula based on Cr of 1.78).   Medications:  -Heparin 1300 units/hr  Assessment: 67 y/o M s/p cath 12/12 who was restarted on heparin on 12/12. Awaiting CABG on Monday. Confirmatory heparin level was therapeutic at 0.51. CBC stable. No bleeding noted.   Goal of Therapy:  Heparin level 0.3-0.7 units/ml Monitor platelets by anticoagulation protocol: Yes   Plan:  -Continue heparin at 1300 units/hr -Daily CBC/HL -Monitor for bleeding -For CABG on 12/15  Albertina Parr, PharmD.  Clinical Pharmacist Pager [phone]

## 2013-09-15 NOTE — Progress Notes (Signed)
Admit: 09/13/2013 LOS: 2  68M CKD3 (BL 1.9) w/ [place] and 3V disease for CABG 09/17/13  Subjective:  PCI yesterday with 3V disease Current plan for CABG on Monday Good UOP.  Feels well. Breathing well [place] Weights   09/13/13 1128 09/13/13 1432  Weight: 111.585 kg (246 lb) 112.038 kg (247 lb)    Current meds: reviewed, ARB held at admission.  Current Labs: reviewed    Physical Exam:  Blood pressure 125/75, pulse 61, temperature 98 F (36.7 C), temperature source Oral, resp. rate 18, height 5\' 9"  (1.753 m), weight 112.038 kg (247 lb), SpO2 94.00%. NAD RRR, nl s1s2 CTAB, nlwob S/nt/nd No LEE Nonfocal No rashes No foley in place MMM, NCAT EOMI, sclera clear  Assessment/Plan 1. CKD3: Stable.  Labs this AM pending, will f/u and review.  Voiding well.  Stable for now.  Will see again on Monday.   Pearson Grippe MD 09/15/2013, 9:30 AM   Recent Labs Lab 09/13/13 1040 09/13/13 1252 09/14/13 0650  NA 139 141 143  K 4.1 4.4 4.0  CL 105 105 109  CO2 22 24 24   GLUCOSE 98 92 105*  BUN 30* 29* 28*  CREATININE 1.93* 1.93* 1.91*  CALCIUM 9.6 9.4 9.0    Recent Labs Lab 09/13/13 1040 09/14/13 0650 09/15/13 0825  WBC 6.6 6.0 6.6  NEUTROABS 4.1  --   --   HGB 16.5 15.8 16.6  HCT 48.4 47.3 48.7  MCV 95.1 94.0 95.7  PLT 178 176 162    Current Facility-Administered Medications  Medication Dose Route Frequency Provider Last Rate Last Dose  . 0.9 %  sodium chloride infusion  250 mL Intravenous PRN Jacolyn Reedy, MD      . acetaminophen (TYLENOL) tablet 650 mg  650 mg Oral Q4H PRN Jacolyn Reedy, MD      . allopurinol (ZYLOPRIM) tablet 300 mg  300 mg Oral Daily Jacolyn Reedy, MD   300 mg at 09/14/13 1007  . ALPRAZolam Duanne Moron) tablet 0.25-0.5 mg  0.25-0.5 mg Oral Q4H PRN Melrose Nakayama, MD      . ALPRAZolam Duanne Moron) tablet 0.5 mg  0.5 mg Oral QHS PRN Jacolyn Reedy, MD      . amLODipine (NORVASC) tablet 5 mg  5 mg Oral Daily Jacolyn Reedy, MD   5 mg  at 09/14/13 1007  . aspirin EC tablet 81 mg  81 mg Oral Daily Jacolyn Reedy, MD      . atorvastatin (LIPITOR) tablet 10 mg  10 mg Oral QHS Jacolyn Reedy, MD   10 mg at 09/14/13 2301  . famotidine (PEPCID) tablet 10 mg  10 mg Oral Daily Jacolyn Reedy, MD   10 mg at 09/14/13 1007  . fluticasone (FLONASE) 50 MCG/ACT nasal spray 2 spray  2 spray Each Nare QHS Jacolyn Reedy, MD   2 spray at 09/14/13 2302  . heparin ADULT infusion 100 units/mL (25000 units/250 mL)  1,300 Units/hr Intravenous Continuous Melrose Nakayama, MD 13 mL/hr at 09/14/13 1700 1,300 Units/hr at 09/14/13 1700  . HYDROcodone-acetaminophen (NORCO/VICODIN) 5-325 MG per tablet 1 tablet  1 tablet Oral Q6H PRN Jacolyn Reedy, MD      . levothyroxine (SYNTHROID, LEVOTHROID) tablet 75 mcg  75 mcg Oral QAC breakfast Jacolyn Reedy, MD   75 mcg at 09/15/13 [phone]  . metoprolol tartrate (LOPRESSOR) tablet 50 mg  50 mg Oral BID Jacolyn Reedy, MD   50 mg at 09/14/13  2301  . nitroGLYCERIN (NITROSTAT) SL tablet 0.4 mg  0.4 mg Sublingual Q5 Min x 3 PRN Jacolyn Reedy, MD      . sodium chloride 0.9 % injection 3 mL  3 mL Intravenous Q12H Jacolyn Reedy, MD      . sodium chloride 0.9 % injection 3 mL  3 mL Intravenous PRN Jacolyn Reedy, MD      . zolpidem [facility]) tablet 10 mg  10 mg Oral QHS Jacolyn Reedy, MD   10 mg at 09/14/13 2301

## 2013-09-16 DIAGNOSIS — E785 Hyperlipidemia, unspecified: Secondary | ICD-10-CM

## 2013-09-16 DIAGNOSIS — Z01810 Encounter for preprocedural cardiovascular examination: Secondary | ICD-10-CM

## 2013-09-16 LAB — BASIC METABOLIC PANEL
BUN: 31 mg/dL — ABNORMAL HIGH (ref 6–23)
CO2: 23 mEq/L (ref 19–32)
Calcium: 9.5 mg/dL (ref 8.4–10.5)
Creatinine, Ser: 1.94 mg/dL — ABNORMAL HIGH (ref 0.50–1.35)
GFR calc Af Amer: 40 mL/min — ABNORMAL LOW (ref >90)
GFR calc non Af Amer: 34 mL/min — ABNORMAL LOW (ref >90)
Sodium: 141 mEq/L (ref 135–145)

## 2013-09-16 LAB — CBC
HCT: 47 % (ref 39.0–52.0)
MCH: 32.1 pg (ref 26.0–34.0)
MCHC: 33.6 g/dL (ref 30.0–36.0)
MCV: 95.5 fL (ref 78.0–100.0)
Platelets: 166 10*3/uL (ref 150–400)
RDW: 13.6 % (ref 11.5–15.5)
WBC: 6.7 10*3/uL (ref 4.0–10.5)

## 2013-09-16 MED ORDER — DIAZEPAM 5 MG PO TABS
5.0000 mg | ORAL_TABLET | Freq: Once | ORAL | Status: AC
  Administered 2013-09-17: 5 mg via ORAL
  Filled 2013-09-16: qty 1

## 2013-09-16 MED ORDER — POTASSIUM CHLORIDE 2 MEQ/ML IV SOLN
80.0000 meq | INTRAVENOUS | Status: DC
  Filled 2013-09-16: qty 40

## 2013-09-16 MED ORDER — METOPROLOL TARTRATE 12.5 MG HALF TABLET
12.5000 mg | ORAL_TABLET | Freq: Once | ORAL | Status: AC
  Administered 2013-09-17: 12.5 mg via ORAL
  Filled 2013-09-16: qty 1

## 2013-09-16 MED ORDER — MAGNESIUM SULFATE 50 % IJ SOLN
40.0000 meq | INTRAMUSCULAR | Status: DC
  Filled 2013-09-16: qty 10

## 2013-09-16 MED ORDER — BISACODYL 5 MG PO TBEC
5.0000 mg | DELAYED_RELEASE_TABLET | Freq: Once | ORAL | Status: DC
  Filled 2013-09-16: qty 1

## 2013-09-16 MED ORDER — PHENYLEPHRINE HCL 10 MG/ML IJ SOLN
30.0000 ug/min | INTRAVENOUS | Status: DC
  Filled 2013-09-16: qty 2

## 2013-09-16 MED ORDER — SODIUM CHLORIDE 0.9 % IV SOLN
INTRAVENOUS | Status: DC
  Filled 2013-09-16: qty 1

## 2013-09-16 MED ORDER — DEXTROSE 5 % IV SOLN
750.0000 mg | INTRAVENOUS | Status: DC
  Filled 2013-09-16: qty 750

## 2013-09-16 MED ORDER — DOPAMINE-DEXTROSE 3.2-5 MG/ML-% IV SOLN
2.0000 ug/kg/min | INTRAVENOUS | Status: DC
  Filled 2013-09-16: qty 250

## 2013-09-16 MED ORDER — PLASMA-LYTE 148 IV SOLN
INTRAVENOUS | Status: AC
  Administered 2013-09-17: 07:00:00
  Filled 2013-09-16: qty 2.5

## 2013-09-16 MED ORDER — VANCOMYCIN HCL 10 G IV SOLR
1500.0000 mg | INTRAVENOUS | Status: AC
  Administered 2013-09-17: 1500 mg via INTRAVENOUS
  Filled 2013-09-16 (×2): qty 1500

## 2013-09-16 MED ORDER — CHLORHEXIDINE GLUCONATE 4 % EX LIQD
60.0000 mL | Freq: Once | CUTANEOUS | Status: AC
  Administered 2013-09-17: 4 via TOPICAL
  Filled 2013-09-16: qty 60

## 2013-09-16 MED ORDER — EPINEPHRINE HCL 1 MG/ML IJ SOLN
0.5000 ug/min | INTRAVENOUS | Status: DC
  Filled 2013-09-16: qty 4

## 2013-09-16 MED ORDER — SODIUM CHLORIDE 0.9 % IV SOLN
INTRAVENOUS | Status: DC
  Filled 2013-09-16: qty 30

## 2013-09-16 MED ORDER — DEXMEDETOMIDINE HCL IN NACL 400 MCG/100ML IV SOLN
0.1000 ug/kg/h | INTRAVENOUS | Status: DC
  Filled 2013-09-16 (×2): qty 100

## 2013-09-16 MED ORDER — TEMAZEPAM 15 MG PO CAPS: 15.0000 mg | ORAL_CAPSULE | Freq: Once | ORAL | Status: AC | PRN

## 2013-09-16 MED ORDER — DEXTROSE 5 % IV SOLN
1.5000 g | INTRAVENOUS | Status: DC
  Filled 2013-09-16: qty 1.5

## 2013-09-16 MED ORDER — CHLORHEXIDINE GLUCONATE 4 % EX LIQD
60.0000 mL | Freq: Once | CUTANEOUS | Status: AC
  Administered 2013-09-16: 4 via TOPICAL
  Filled 2013-09-16: qty 60

## 2013-09-16 MED ORDER — NITROGLYCERIN IN D5W 200-5 MCG/ML-% IV SOLN
2.0000 ug/min | INTRAVENOUS | Status: DC
  Filled 2013-09-16: qty 250

## 2013-09-16 MED ORDER — SODIUM CHLORIDE 0.9 % IV SOLN
INTRAVENOUS | Status: DC
  Filled 2013-09-16: qty 40

## 2013-09-16 NOTE — Progress Notes (Signed)
Pt places self on and off cpap but says he may not wear tonight.

## 2013-09-16 NOTE — Progress Notes (Signed)
[address]       [place] [address]             [phone]                 2 Days Post-Op Procedure(s) (LRB): LEFT HEART CATHETERIZATION WITH CORONARY ANGIOGRAM (N/A)  LOS: 3 days   Subjective: No chest pain.   Objective: Vital signs in last 24 hours: Patient Vitals for the past 24 hrs:  BP Temp Temp src Pulse Resp SpO2 Height Weight  09/16/13 0520 107/76 mmHg 98 F (36.7 C) Oral 68 16 98 % - -  09/15/13 2055 116/79 mmHg 98.5 F (36.9 C) Oral 65 16 96 % 5\' 9"  (1.753 m) 245 lb (111.131 kg)  09/15/13 1400 137/67 mmHg 97.7 F (36.5 C) Oral 67 18 96 % - -    Filed Weights   09/13/13 1128 09/13/13 1432 09/15/13 2055  Weight: 246 lb (111.585 kg) 247 lb (112.038 kg) 245 lb (111.131 kg)    Hemodynamic parameters for last 24 hours:    Intake/Output from previous day:   Intake/Output this shift:    Scheduled Meds: . allopurinol  300 mg Oral Daily  . amLODipine  5 mg Oral Daily  . aspirin EC  81 mg Oral Daily  . atorvastatin  10 mg Oral QHS  . famotidine  10 mg Oral Daily  . fluticasone  2 spray Each Nare QHS  . levothyroxine  75 mcg Oral QAC breakfast  . metoprolol  50 mg Oral BID  . sodium chloride  3 mL Intravenous Q12H  . zolpidem  10 mg Oral QHS   Continuous Infusions: . heparin 1,300 Units/hr (09/15/13 1113)   PRN Meds:.sodium chloride, acetaminophen, ALPRAZolam, ALPRAZolam, HYDROcodone-acetaminophen, nitroGLYCERIN, sodium chloride  General appearance: alert and cooperative Neurologic: intact Heart: regular rate and rhythm, S1, S2 normal, no murmur, click, rub or gallop Lungs: clear to auscultation bilaterally Abdomen: soft, non-tender; bowel sounds normal; no masses,  no organomegaly Extremities: extremities normal, atraumatic, no cyanosis or edema and Homans sign is negative, no sign of DVT  Lab Results: CBC: Recent Labs  09/15/13 0825 09/16/13 0415  WBC 6.6 6.7  HGB 16.6 15.8  HCT 48.7 47.0  PLT 162 166   BMET:    Recent Labs  09/15/13 0825 09/16/13 0415  NA 140 141  K 4.2 4.4  CL 108 108  CO2 22 23  GLUCOSE 108* 91  BUN 25* 31*  CREATININE 1.78* 1.94*  CALCIUM 9.4 9.5    PT/INR:  Recent Labs  09/13/13 1252  LABPROT 13.3  INR 1.03   Chronic Kidney Disease       Stage IIIA GFR 45-59       Lab Results  Component Value Date   CREATININE 1.94* 09/16/2013   Estimated Creatinine Clearance: 46 ml/min (by C-G formula based on Cr of 1.94).    Radiology Dg Chest 2 View  09/13/2013   CLINICAL DATA:  Mid left-sided chest pain with tingling down arm.  EXAM: CHEST  2 VIEW  COMPARISON:  03/03/2010  FINDINGS: Cardiac silhouette is upper limits of normal in size, unchanged. Thoracic aorta remains moderately tortuous. The lungs are well inflated and clear. There is no evidence of pleural effusion or pneumothorax. Multilevel osteophytosis is noted in the thoracic spine.  IMPRESSION: No evidence of active cardiopulmonary disease.   Electronically Signed   By: Logan Bores   On: 09/13/2013 11:07   Assessment/Plan: S/P  Procedure(s) (LRB): LEFT HEART CATHETERIZATION WITH CORONARY ANGIOGRAM (N/A) For CABG tomorrow by Dr Roxan Hockey, Patient has no further questions    Grace Isaac MD 09/16/2013 12:15 PM

## 2013-09-16 NOTE — Progress Notes (Signed)
ANTICOAGULATION CONSULT NOTE - Follow Up Consult  Pharmacy Consult for Heparin  Indication: chest pain/ACS; CABG on Monday   Allergies  Allergen Reactions  . Ace Inhibitors Cough    Patient Measurements: Height: 5\' 9"  (175.3 cm) Weight: 245 lb (111.131 kg) IBW/kg (Calculated) : 70.7 Heparin Dosing Weight: ~96 kg  Vital Signs: Temp: 98 F (36.7 C) (12/14 0520) Temp src: Oral (12/14 0520) BP: 107/76 mmHg (12/14 0520) Pulse Rate: 68 (12/14 0520)  Labs:  Recent Labs  09/13/13 1040 09/13/13 1252 09/13/13 1254  09/13/13 1800 09/14/13 0020 09/14/13 0650 09/14/13 2310 09/15/13 0825 09/16/13 0415  HGB 16.5  --   --   --   --   --  15.8  --  16.6 15.8  HCT 48.4  --   --   --   --   --  47.3  --  48.7 47.0  PLT 178  --   --   --   --   --  176  --  162 166  APTT 35 >200*  --   --   --   --   --   --   --   --   LABPROT 13.1 13.3  --   --   --   --   --   --   --   --   INR 1.01 1.03  --   --   --   --   --   --   --   --   HEPARINUNFRC  --   --   --   < > 0.50  --  0.70 0.38 0.51 0.47  CREATININE 1.93* 1.93*  --   --   --   --  1.91*  --  1.78* 1.94*  TROPONINI <0.30  --  <0.30  --  <0.30 <0.30  --   --   --   --   < > = values in this interval not displayed.  Estimated Creatinine Clearance: 46 ml/min (by C-G formula based on Cr of 1.94).   Medications:  -Heparin 1300 units/hr  Assessment: 67 y/o M s/p cath 12/12 who was restarted on heparin on 12/12. Awaiting CABG on Monday. AM heparin level remains therapeutic at 0.47. CBC stable. No bleeding noted.   Goal of Therapy:  Heparin level 0.3-0.7 units/ml Monitor platelets by anticoagulation protocol: Yes   Plan:  -Continue heparin at 1300 units/hr -Daily CBC/HL -Monitor for bleeding -For CABG on 12/15  Albertina Parr, PharmD.  Clinical Pharmacist Pager [phone]

## 2013-09-16 NOTE — Progress Notes (Signed)
     Subjective:  Feels well. Reviewed Dr. Roxan Hockey note. CABG planned Monday.   Objective:  Vital Signs in the last 24 hours: Temp:  [98 F (36.7 C)-98.5 F (36.9 C)] 98.2 F (36.8 C) (12/14 1349) Pulse Rate:  [63-68] 63 (12/14 1349) Resp:  [16] 16 (12/14 1349) BP: (107-133)/(76-79) 133/77 mmHg (12/14 1349) SpO2:  [96 %-98 %] 96 % (12/14 1349) Weight:  [245 lb (111.131 kg)] 245 lb (111.131 kg) (12/13 2055)  Intake/Output from previous day:     Physical Exam: General: Well developed, well nourished, in no acute distress. Head:  Normocephalic and atraumatic. Lungs: Clear to auscultation and percussion. Heart: Normal S1 and S2.  No murmur, rubs or gallops.  Abdomen: soft, non-tender, positive bowel sounds. Extremities: No clubbing or cyanosis. No edema. Neurologic: Alert and oriented x 3.    Lab Results:  Recent Labs  09/15/13 0825 09/16/13 0415  WBC 6.6 6.7  HGB 16.6 15.8  PLT 162 166    Recent Labs  09/15/13 0825 09/16/13 0415  NA 140 141  K 4.2 4.4  CL 108 108  CO2 22 23  GLUCOSE 108* 91  BUN 25* 31*  CREATININE 1.78* 1.94*    Recent Labs  09/13/13 1800 09/14/13 0020  TROPONINI <0.30 <0.30   Telemetry: NSR Personally viewed.   Cardiac Studies:  3 v CAD  Assessment/Plan:  Principal Problem:   Unstable angina Active Problems:   Hypertensive heart disease   CAD   Chronic kidney disease (CKD), stage IV (severe)   -Await CABG Monday. Dr. Roxan Hockey.  -Dr. Joelyn Oms on board with renal. CKD 4. Creat 1.78-1.9. No ACE-I -Statin on board. Atorva 10mg .  -Spoke with Dr. Servando Snare this am. No further questions.   Dalton Mitchell, [place] 09/16/2013, 3:35 PM

## 2013-09-17 ENCOUNTER — Inpatient Hospital Stay (HOSPITAL_COMMUNITY): Payer: Medicare Other | Admitting: Anesthesiology

## 2013-09-17 ENCOUNTER — Encounter (HOSPITAL_COMMUNITY): Payer: Medicare Other | Admitting: Anesthesiology

## 2013-09-17 ENCOUNTER — Encounter (HOSPITAL_COMMUNITY)
Admission: EM | Disposition: A | Discharge status: Home / Self Care | Payer: Medicare Other | Source: Home / Self Care | Attending: Thoracic Surgery (Cardiothoracic Vascular Surgery)

## 2013-09-17 ENCOUNTER — Encounter (HOSPITAL_COMMUNITY)
Admission: EM | Disposition: A | Discharge status: Home / Self Care | Payer: Self-pay | Source: Home / Self Care | Attending: Thoracic Surgery (Cardiothoracic Vascular Surgery)

## 2013-09-17 ENCOUNTER — Encounter (HOSPITAL_COMMUNITY): Payer: Self-pay | Admitting: Anesthesiology

## 2013-09-17 ENCOUNTER — Inpatient Hospital Stay (HOSPITAL_COMMUNITY): Payer: Medicare Other

## 2013-09-17 DIAGNOSIS — I2510 Atherosclerotic heart disease of native coronary artery without angina pectoris: Secondary | ICD-10-CM

## 2013-09-17 DIAGNOSIS — IMO0002 Reserved for concepts with insufficient information to code with codable children: Secondary | ICD-10-CM

## 2013-09-17 HISTORY — PX: EXPLORATION POST OPERATIVE OPEN HEART: SHX5061

## 2013-09-17 HISTORY — PX: CORONARY ARTERY BYPASS GRAFT: SHX141

## 2013-09-17 LAB — POCT I-STAT 3, ART BLOOD GAS (G3+)
Acid-base deficit: 2 mmol/L (ref 0.0–2.0)
Acid-base deficit: 4 mmol/L — ABNORMAL HIGH (ref 0.0–2.0)
Acid-base deficit: 5 mmol/L — ABNORMAL HIGH (ref 0.0–2.0)
Bicarbonate: 21 mEq/L (ref 20.0–24.0)
Bicarbonate: 23 mEq/L (ref 20.0–24.0)
Bicarbonate: 23.5 mEq/L (ref 20.0–24.0)
Bicarbonate: 24.2 mEq/L — ABNORMAL HIGH (ref 20.0–24.0)
O2 Saturation: 89 %
Patient temperature: 99.3
TCO2: 22 mmol/L (ref 0–100)
TCO2: 25 mmol/L (ref 0–100)
TCO2: 25 mmol/L (ref 0–100)
pCO2 arterial: 41.9 mmHg (ref 35.0–45.0)
pCO2 arterial: 39.8 mmHg (ref 35.0–45.0)
pCO2 arterial: 49.5 mmHg — ABNORMAL HIGH (ref 35.0–45.0)
pH, Arterial: 7.356 (ref 7.350–7.450)
pH, Arterial: 7.276 — ABNORMAL LOW (ref 7.350–7.450)
pH, Arterial: 7.29 — ABNORMAL LOW (ref 7.350–7.450)
pO2, Arterial: 291 mmHg — ABNORMAL HIGH (ref 80.0–100.0)
pO2, Arterial: 56 mmHg — ABNORMAL LOW (ref 80.0–100.0)
pO2, Arterial: 68 mmHg — ABNORMAL LOW (ref 80.0–100.0)
pO2, Arterial: 98 mmHg (ref 80.0–100.0)

## 2013-09-17 LAB — POCT I-STAT 4, (NA,K, GLUC, HGB,HCT)
Glucose, Bld: 125 mg/dL — ABNORMAL HIGH (ref 70–99)
Glucose, Bld: 126 mg/dL — ABNORMAL HIGH (ref 70–99)
Glucose, Bld: 127 mg/dL — ABNORMAL HIGH (ref 70–99)
Glucose, Bld: 85 mg/dL (ref 70–99)
HCT: 45 % (ref 39.0–52.0)
HCT: 35 % — ABNORMAL LOW (ref 39.0–52.0)
HCT: 34 % — ABNORMAL LOW (ref 39.0–52.0)
HCT: 36 % — ABNORMAL LOW (ref 39.0–52.0)
HCT: 43 % (ref 39.0–52.0)
Hemoglobin: 11.2 g/dL — ABNORMAL LOW (ref 13.0–17.0)
Hemoglobin: 11.6 g/dL — ABNORMAL LOW (ref 13.0–17.0)
Hemoglobin: 11.9 g/dL — ABNORMAL LOW (ref 13.0–17.0)
Hemoglobin: 12.2 g/dL — ABNORMAL LOW (ref 13.0–17.0)
Hemoglobin: 13.6 g/dL (ref 13.0–17.0)
Hemoglobin: 15.3 g/dL (ref 13.0–17.0)
Potassium: 4 mEq/L (ref 3.5–5.1)
Potassium: 4.3 mEq/L (ref 3.5–5.1)
Potassium: 4.9 mEq/L (ref 3.5–5.1)
Potassium: 5.1 mEq/L (ref 3.5–5.1)
Sodium: 139 mEq/L (ref 135–145)
Sodium: 141 mEq/L (ref 135–145)
Sodium: 137 mEq/L (ref 135–145)
Sodium: 137 mEq/L (ref 135–145)

## 2013-09-17 LAB — CBC
HCT: 45.8 % (ref 39.0–52.0)
HCT: 42.4 % (ref 39.0–52.0)
HCT: 43.3 % (ref 39.0–52.0)
HCT: 38.7 % — ABNORMAL LOW (ref 39.0–52.0)
Hemoglobin: 14.6 g/dL (ref 13.0–17.0)
Hemoglobin: 14.6 g/dL (ref 13.0–17.0)
Hemoglobin: 12.9 g/dL — ABNORMAL LOW (ref 13.0–17.0)
MCH: 32.2 pg (ref 26.0–34.0)
MCH: 32 pg (ref 26.0–34.0)
MCHC: 33.4 g/dL (ref 30.0–36.0)
MCHC: 33.7 g/dL (ref 30.0–36.0)
MCHC: 33.3 g/dL (ref 30.0–36.0)
MCV: 95.2 fL (ref 78.0–100.0)
MCV: 95 fL (ref 78.0–100.0)
Platelets: 156 10*3/uL (ref 150–400)
Platelets: 120 K/uL — ABNORMAL LOW (ref 150–400)
RBC: 4.81 MIL/uL (ref 4.22–5.81)
RBC: 4.54 MIL/uL (ref 4.22–5.81)
RBC: 4.56 MIL/uL (ref 4.22–5.81)
RBC: 4.07 MIL/uL — ABNORMAL LOW (ref 4.22–5.81)
RDW: 13.6 % (ref 11.5–15.5)
RDW: 13.7 % (ref 11.5–15.5)
WBC: 15.1 K/uL — ABNORMAL HIGH (ref 4.0–10.5)
WBC: 14.8 10*3/uL — ABNORMAL HIGH (ref 4.0–10.5)

## 2013-09-17 LAB — POCT I-STAT, CHEM 8
BUN: 28 mg/dL — ABNORMAL HIGH (ref 6–23)
Chloride: 106 mEq/L (ref 96–112)
Glucose, Bld: 134 mg/dL — ABNORMAL HIGH (ref 70–99)
HCT: 43 % (ref 39.0–52.0)
Potassium: 4.3 mEq/L (ref 3.5–5.1)
Sodium: 140 mEq/L (ref 135–145)

## 2013-09-17 LAB — BASIC METABOLIC PANEL
BUN: 32 mg/dL — ABNORMAL HIGH (ref 6–23)
Creatinine, Ser: 1.92 mg/dL — ABNORMAL HIGH (ref 0.50–1.35)
GFR calc Af Amer: 40 mL/min — ABNORMAL LOW (ref >90)
GFR calc non Af Amer: 35 mL/min — ABNORMAL LOW (ref >90)

## 2013-09-17 LAB — BLOOD GAS, ARTERIAL
Acid-base deficit: 1.1 mmol/L (ref 0.0–2.0)
Drawn by: 362771
O2 Saturation: 95.2 %
Patient temperature: 98.6
TCO2: 25.3 mmol/L (ref 0–100)
pO2, Arterial: 77.1 mmHg — ABNORMAL LOW (ref 80.0–100.0)

## 2013-09-17 LAB — HEMOGLOBIN AND HEMATOCRIT, BLOOD: HCT: 35.3 % — ABNORMAL LOW (ref 39.0–52.0)

## 2013-09-17 LAB — APTT
aPTT: 34 seconds (ref 24–37)
aPTT: 37 s (ref 24–37)

## 2013-09-17 LAB — CREATININE, SERUM
Creatinine, Ser: 1.73 mg/dL — ABNORMAL HIGH (ref 0.50–1.35)
GFR calc non Af Amer: 39 mL/min — ABNORMAL LOW (ref >90)

## 2013-09-17 LAB — MAGNESIUM: Magnesium: 3 mg/dL — ABNORMAL HIGH (ref 1.5–2.5)

## 2013-09-17 LAB — HEPARIN LEVEL (UNFRACTIONATED): Heparin Unfractionated: 0.52 IU/mL (ref 0.30–0.70)

## 2013-09-17 LAB — GLUCOSE, CAPILLARY
Glucose-Capillary: 121 mg/dL — ABNORMAL HIGH (ref 70–99)
Glucose-Capillary: 134 mg/dL — ABNORMAL HIGH (ref 70–99)
Glucose-Capillary: 131 mg/dL — ABNORMAL HIGH (ref 70–99)
Glucose-Capillary: 128 mg/dL — ABNORMAL HIGH (ref 70–99)

## 2013-09-17 LAB — PLATELET COUNT: Platelets: 106 10*3/uL — ABNORMAL LOW (ref 150–400)

## 2013-09-17 LAB — PROTIME-INR
INR: 1.2 (ref 0.00–1.49)
Prothrombin Time: 14.9 seconds (ref 11.6–15.2)

## 2013-09-17 SURGERY — CORONARY ARTERY BYPASS GRAFTING (CABG)
Anesthesia: General | Site: Chest

## 2013-09-17 SURGERY — EXPLORATION POST OPERATIVE OPEN HEART
Anesthesia: General | Site: Chest

## 2013-09-17 MED ORDER — FENTANYL CITRATE 0.05 MG/ML IJ SOLN
INTRAMUSCULAR | Status: DC | PRN
  Administered 2013-09-17: 100 ug via INTRAVENOUS
  Administered 2013-09-17: 150 ug via INTRAVENOUS

## 2013-09-17 MED ORDER — ALBUMIN HUMAN 5 % IV SOLN
250.0000 mL | INTRAVENOUS | Status: AC | PRN
  Administered 2013-09-18: 250 mL via INTRAVENOUS
  Filled 2013-09-17: qty 250

## 2013-09-17 MED ORDER — HEPARIN SODIUM (PORCINE) 1000 UNIT/ML IJ SOLN
INTRAMUSCULAR | Status: DC | PRN
  Administered 2013-09-17: 31000 [IU] via INTRAVENOUS
  Administered 2013-09-17: 2000 [IU] via INTRAVENOUS

## 2013-09-17 MED ORDER — ALBUMIN HUMAN 5 % IV SOLN
INTRAVENOUS | Status: AC
  Administered 2013-09-17: 12.5 g via INTRAVENOUS
  Filled 2013-09-17: qty 250

## 2013-09-17 MED ORDER — NITROGLYCERIN IN D5W 200-5 MCG/ML-% IV SOLN: 0.0000 ug/min | INTRAVENOUS | Status: DC

## 2013-09-17 MED ORDER — ALBUMIN HUMAN 5 % IV SOLN
INTRAVENOUS | Status: DC | PRN
  Administered 2013-09-17: 22:00:00 via INTRAVENOUS

## 2013-09-17 MED ORDER — FAMOTIDINE IN NACL 20-0.9 MG/50ML-% IV SOLN
20.0000 mg | Freq: Two times a day (BID) | INTRAVENOUS | Status: DC
  Administered 2013-09-17: 20 mg via INTRAVENOUS
  Filled 2013-09-17: qty 50

## 2013-09-17 MED ORDER — LACTATED RINGERS IV SOLN
INTRAVENOUS | Status: DC | PRN
  Administered 2013-09-17 (×3): via INTRAVENOUS

## 2013-09-17 MED ORDER — VANCOMYCIN HCL 500 MG IV SOLR
500.0000 mg | INTRAVENOUS | Status: DC
  Filled 2013-09-17: qty 500

## 2013-09-17 MED ORDER — MIDAZOLAM HCL 2 MG/2ML IJ SOLN: 2.0000 mg | INTRAMUSCULAR | Status: DC | PRN

## 2013-09-17 MED ORDER — ACETAMINOPHEN 500 MG PO TABS: 1000.0000 mg | ORAL_TABLET | Freq: Four times a day (QID) | ORAL | Status: DC

## 2013-09-17 MED ORDER — SODIUM CHLORIDE 0.9 % IV SOLN
INTRAVENOUS | Status: DC
  Administered 2013-09-17: 1 mL via INTRAVENOUS

## 2013-09-17 MED ORDER — BISACODYL 10 MG RE SUPP: 10.0000 mg | Freq: Every day | RECTAL | Status: DC

## 2013-09-17 MED ORDER — ACETAMINOPHEN 160 MG/5ML PO SOLN
1000.0000 mg | Freq: Four times a day (QID) | ORAL | Status: DC
  Administered 2013-09-18: 1000 mg
  Filled 2013-09-17: qty 40.6

## 2013-09-17 MED ORDER — SODIUM CHLORIDE 0.9 % IV SOLN
INTRAVENOUS | Status: DC
  Administered 2013-09-17: 13:00:00 via INTRAVENOUS
  Filled 2013-09-17: qty 1

## 2013-09-17 MED ORDER — ALBUMIN HUMAN 5 % IV SOLN
12.5000 g | Freq: Once | INTRAVENOUS | Status: AC
  Administered 2013-09-17: 12.5 g via INTRAVENOUS

## 2013-09-17 MED ORDER — INSULIN REGULAR BOLUS VIA INFUSION
0.0000 [IU] | Freq: Three times a day (TID) | INTRAVENOUS | Status: DC
  Filled 2013-09-17: qty 10

## 2013-09-17 MED ORDER — MORPHINE SULFATE 2 MG/ML IJ SOLN
2.0000 mg | INTRAMUSCULAR | Status: DC | PRN
  Administered 2013-09-18 (×5): 4 mg via INTRAVENOUS
  Administered 2013-09-19: 2 mg via INTRAVENOUS
  Administered 2013-09-19 (×2): 4 mg via INTRAVENOUS
  Administered 2013-09-19 – 2013-09-20 (×3): 2 mg via INTRAVENOUS
  Filled 2013-09-17 (×3): qty 2
  Filled 2013-09-17: qty 1
  Filled 2013-09-17 (×2): qty 2
  Filled 2013-09-17: qty 1
  Filled 2013-09-17: qty 2
  Filled 2013-09-17: qty 1
  Filled 2013-09-17: qty 2
  Filled 2013-09-17: qty 1
  Filled 2013-09-17: qty 2

## 2013-09-17 MED ORDER — SODIUM CHLORIDE 0.9 % IV SOLN
INTRAVENOUS | Status: DC
  Filled 2013-09-17: qty 1

## 2013-09-17 MED ORDER — SODIUM CHLORIDE 0.9 % IV SOLN
INTRAVENOUS | Status: DC
  Filled 2013-09-17: qty 30

## 2013-09-17 MED ORDER — LACTATED RINGERS IV SOLN
INTRAVENOUS | Status: DC | PRN
  Administered 2013-09-17 (×3): via INTRAVENOUS

## 2013-09-17 MED ORDER — LACTATED RINGERS IV SOLN: 500.0000 mL | Freq: Once | INTRAVENOUS | Status: DC | PRN

## 2013-09-17 MED ORDER — DEXTROSE 5 % IV SOLN
1.5000 g | INTRAVENOUS | Status: DC | PRN
  Administered 2013-09-17: .75 g via INTRAVENOUS
  Administered 2013-09-17: 1.5 g via INTRAVENOUS

## 2013-09-17 MED ORDER — SODIUM CHLORIDE 0.45 % IV SOLN
INTRAVENOUS | Status: DC
  Administered 2013-09-17: 1 mL via INTRAVENOUS

## 2013-09-17 MED ORDER — DOPAMINE-DEXTROSE 3.2-5 MG/ML-% IV SOLN: 3.0000 ug/kg/min | INTRAVENOUS | Status: DC

## 2013-09-17 MED ORDER — SUCCINYLCHOLINE CHLORIDE 20 MG/ML IJ SOLN
INTRAMUSCULAR | Status: DC | PRN
  Administered 2013-09-17: 140 mg via INTRAVENOUS

## 2013-09-17 MED ORDER — FENTANYL CITRATE 0.05 MG/ML IJ SOLN
INTRAMUSCULAR | Status: AC
  Filled 2013-09-17: qty 2

## 2013-09-17 MED ORDER — NITROGLYCERIN IN D5W 200-5 MCG/ML-% IV SOLN
INTRAVENOUS | Status: DC | PRN
  Administered 2013-09-17: 5 ug/min via INTRAVENOUS

## 2013-09-17 MED ORDER — DEXMEDETOMIDINE HCL IN NACL 200 MCG/50ML IV SOLN
0.1000 ug/kg/h | INTRAVENOUS | Status: DC
  Administered 2013-09-17 – 2013-09-18 (×2): 0.7 ug/kg/h via INTRAVENOUS
  Filled 2013-09-17: qty 50

## 2013-09-17 MED ORDER — CALCIUM CHLORIDE 10 % IV SOLN: 1.0000 g | Freq: Once | INTRAVENOUS | Status: AC | PRN

## 2013-09-17 MED ORDER — PHENYLEPHRINE HCL 10 MG/ML IJ SOLN
0.0000 ug/min | INTRAVENOUS | Status: DC
  Filled 2013-09-17: qty 2

## 2013-09-17 MED ORDER — LACTATED RINGERS IV SOLN
INTRAVENOUS | Status: DC
  Administered 2013-09-17: 1 mL via INTRAVENOUS
  Administered 2013-09-19: 07:00:00 via INTRAVENOUS

## 2013-09-17 MED ORDER — ARTIFICIAL TEARS OP OINT
TOPICAL_OINTMENT | OPHTHALMIC | Status: DC | PRN
  Administered 2013-09-17: 1 via OPHTHALMIC

## 2013-09-17 MED ORDER — METOPROLOL TARTRATE 12.5 MG HALF TABLET
12.5000 mg | ORAL_TABLET | Freq: Two times a day (BID) | ORAL | Status: DC
  Filled 2013-09-17: qty 1

## 2013-09-17 MED ORDER — SODIUM CHLORIDE 0.9 % IV SOLN
100.0000 [IU] | INTRAVENOUS | Status: DC | PRN
  Administered 2013-09-17: 2 [IU]/h via INTRAVENOUS

## 2013-09-17 MED ORDER — SODIUM CHLORIDE 0.9 % IJ SOLN
3.0000 mL | Freq: Two times a day (BID) | INTRAMUSCULAR | Status: DC
  Administered 2013-09-18 – 2013-09-21 (×6): 3 mL via INTRAVENOUS

## 2013-09-17 MED ORDER — MORPHINE SULFATE 2 MG/ML IJ SOLN: 1.0000 mg | INTRAMUSCULAR | Status: AC | PRN

## 2013-09-17 MED ORDER — NITROGLYCERIN IN D5W 200-5 MCG/ML-% IV SOLN
0.0000 ug/min | INTRAVENOUS | Status: DC
  Administered 2013-09-17: 0 ug/min via INTRAVENOUS

## 2013-09-17 MED ORDER — ROCURONIUM BROMIDE 100 MG/10ML IV SOLN
INTRAVENOUS | Status: DC | PRN
  Administered 2013-09-17 (×4): 50 mg via INTRAVENOUS

## 2013-09-17 MED ORDER — ONDANSETRON HCL 4 MG/2ML IJ SOLN
4.0000 mg | Freq: Four times a day (QID) | INTRAMUSCULAR | Status: DC | PRN
  Administered 2013-09-18 (×2): 4 mg via INTRAVENOUS
  Filled 2013-09-17 (×2): qty 2

## 2013-09-17 MED ORDER — HEMOSTATIC AGENTS (NO CHARGE) OPTIME
TOPICAL | Status: DC | PRN
  Administered 2013-09-17: 1 via TOPICAL

## 2013-09-17 MED ORDER — POTASSIUM CHLORIDE 2 MEQ/ML IV SOLN
80.0000 meq | INTRAVENOUS | Status: DC
  Filled 2013-09-17: qty 40

## 2013-09-17 MED ORDER — FENTANYL CITRATE 0.05 MG/ML IJ SOLN
INTRAMUSCULAR | Status: DC | PRN
  Administered 2013-09-17: 100 ug via INTRAVENOUS
  Administered 2013-09-17: 250 ug via INTRAVENOUS
  Administered 2013-09-17: 100 ug via INTRAVENOUS
  Administered 2013-09-17: 250 ug via INTRAVENOUS
  Administered 2013-09-17: 150 ug via INTRAVENOUS
  Administered 2013-09-17 (×2): 250 ug via INTRAVENOUS

## 2013-09-17 MED ORDER — PANTOPRAZOLE SODIUM 40 MG PO TBEC: 40.0000 mg | DELAYED_RELEASE_TABLET | Freq: Every day | ORAL | Status: DC

## 2013-09-17 MED ORDER — SODIUM BICARBONATE 8.4 % IV SOLN
50.0000 meq | Freq: Once | INTRAVENOUS | Status: AC
  Administered 2013-09-17: 50 meq via INTRAVENOUS
  Filled 2013-09-17: qty 50

## 2013-09-17 MED ORDER — MAGNESIUM SULFATE 40 MG/ML IJ SOLN
4.0000 g | Freq: Once | INTRAMUSCULAR | Status: AC
  Administered 2013-09-17: 4 g via INTRAVENOUS

## 2013-09-17 MED ORDER — PHENYLEPHRINE HCL 10 MG/ML IJ SOLN
0.0000 ug/min | INTRAVENOUS | Status: DC
  Administered 2013-09-18: 40 ug/min via INTRAVENOUS
  Administered 2013-09-18 – 2013-09-19 (×2): 30 ug/min via INTRAVENOUS
  Filled 2013-09-17 (×5): qty 2

## 2013-09-17 MED ORDER — DOCUSATE SODIUM 100 MG PO CAPS: 200.0000 mg | ORAL_CAPSULE | Freq: Every day | ORAL | Status: DC

## 2013-09-17 MED ORDER — ASPIRIN 81 MG PO CHEW: 324.0000 mg | CHEWABLE_TABLET | Freq: Every day | ORAL | Status: DC

## 2013-09-17 MED ORDER — OXYCODONE HCL 5 MG PO TABS
5.0000 mg | ORAL_TABLET | ORAL | Status: DC | PRN
  Administered 2013-09-19 – 2013-09-20 (×5): 10 mg via ORAL
  Administered 2013-09-22 (×2): 5 mg via ORAL
  Filled 2013-09-17 (×2): qty 2
  Filled 2013-09-17 (×2): qty 1
  Filled 2013-09-17 (×3): qty 2

## 2013-09-17 MED ORDER — 0.9 % SODIUM CHLORIDE (POUR BTL) OPTIME
TOPICAL | Status: DC | PRN
  Administered 2013-09-17: 5000 mL

## 2013-09-17 MED ORDER — PHENYLEPHRINE HCL 10 MG/ML IJ SOLN
10.0000 mg | INTRAVENOUS | Status: DC | PRN
  Administered 2013-09-17: 25 ug/min via INTRAVENOUS

## 2013-09-17 MED ORDER — DEXTROSE 5 % IV SOLN
750.0000 mg | INTRAVENOUS | Status: DC
  Filled 2013-09-17 (×2): qty 750

## 2013-09-17 MED ORDER — VECURONIUM BROMIDE 10 MG IV SOLR
INTRAVENOUS | Status: DC | PRN
  Administered 2013-09-17 (×3): 5 mg via INTRAVENOUS

## 2013-09-17 MED ORDER — ACETAMINOPHEN 650 MG RE SUPP: 650.0000 mg | Freq: Once | RECTAL | Status: AC

## 2013-09-17 MED ORDER — SODIUM CHLORIDE 0.9 % IV SOLN
10.0000 g | INTRAVENOUS | Status: DC | PRN
  Administered 2013-09-17: 1 g/h via INTRAVENOUS

## 2013-09-17 MED ORDER — LACTATED RINGERS IV SOLN
INTRAVENOUS | Status: DC
  Administered 2013-09-17: 20 mL/h via INTRAVENOUS

## 2013-09-17 MED ORDER — SODIUM CHLORIDE 0.9 % IJ SOLN: 3.0000 mL | Freq: Two times a day (BID) | INTRAMUSCULAR | Status: DC

## 2013-09-17 MED ORDER — DEXMEDETOMIDINE HCL IN NACL 200 MCG/50ML IV SOLN
0.1000 ug/kg/h | INTRAVENOUS | Status: DC
  Administered 2013-09-17: 23:00:00 via INTRAVENOUS
  Administered 2013-09-17: 0.7 ug/kg/h via INTRAVENOUS
  Filled 2013-09-17 (×2): qty 50

## 2013-09-17 MED ORDER — MAGNESIUM SULFATE 50 % IJ SOLN
40.0000 meq | INTRAMUSCULAR | Status: DC
  Filled 2013-09-17: qty 10

## 2013-09-17 MED ORDER — SODIUM CHLORIDE 0.9 % IV SOLN
1.0000 g/h | INTRAVENOUS | Status: DC
  Filled 2013-09-17: qty 20

## 2013-09-17 MED ORDER — SODIUM CHLORIDE 0.9 % IJ SOLN
OROMUCOSAL | Status: DC | PRN
  Administered 2013-09-17 (×3): via TOPICAL

## 2013-09-17 MED ORDER — MORPHINE SULFATE 2 MG/ML IJ SOLN: 1.0000 mg | INTRAMUSCULAR | Status: DC | PRN

## 2013-09-17 MED ORDER — LEVALBUTEROL HCL 0.63 MG/3ML IN NEBU
0.6300 mg | INHALATION_SOLUTION | Freq: Four times a day (QID) | RESPIRATORY_TRACT | Status: DC
  Administered 2013-09-18 (×3): 0.63 mg via RESPIRATORY_TRACT
  Filled 2013-09-17 (×5): qty 3

## 2013-09-17 MED ORDER — METOPROLOL TARTRATE 1 MG/ML IV SOLN: 2.5000 mg | INTRAVENOUS | Status: DC | PRN

## 2013-09-17 MED ORDER — METOPROLOL TARTRATE 12.5 MG HALF TABLET
12.5000 mg | ORAL_TABLET | Freq: Two times a day (BID) | ORAL | Status: DC
  Administered 2013-09-18 – 2013-09-20 (×3): 12.5 mg via ORAL
  Filled 2013-09-17 (×6): qty 1

## 2013-09-17 MED ORDER — BISACODYL 5 MG PO TBEC
10.0000 mg | DELAYED_RELEASE_TABLET | Freq: Every day | ORAL | Status: DC
  Administered 2013-09-19 – 2013-09-21 (×3): 10 mg via ORAL
  Filled 2013-09-17 (×3): qty 2

## 2013-09-17 MED ORDER — LIDOCAINE HCL (CARDIAC) 20 MG/ML IV SOLN
INTRAVENOUS | Status: DC | PRN
  Administered 2013-09-17: 80 mg via INTRAVENOUS

## 2013-09-17 MED ORDER — SODIUM CHLORIDE 0.9 % IV SOLN
INTRAVENOUS | Status: DC
  Administered 2013-09-17: 1.2 [IU]/h via INTRAVENOUS

## 2013-09-17 MED ORDER — PROTAMINE SULFATE 10 MG/ML IV SOLN
INTRAVENOUS | Status: DC | PRN
  Administered 2013-09-17: 50 mg via INTRAVENOUS
  Administered 2013-09-17: 60 mg via INTRAVENOUS
  Administered 2013-09-17 (×3): 50 mg via INTRAVENOUS
  Administered 2013-09-17: 20 mg via INTRAVENOUS

## 2013-09-17 MED ORDER — AMINOCAPROIC ACID 250 MG/ML IV SOLN
INTRAVENOUS | Status: DC
  Filled 2013-09-17: qty 40

## 2013-09-17 MED ORDER — VANCOMYCIN HCL IN DEXTROSE 1-5 GM/200ML-% IV SOLN
1000.0000 mg | Freq: Once | INTRAVENOUS | Status: AC
  Administered 2013-09-18: 1000 mg via INTRAVENOUS
  Filled 2013-09-17: qty 200

## 2013-09-17 MED ORDER — OXYCODONE HCL 5 MG PO TABS: 5.0000 mg | ORAL_TABLET | ORAL | Status: DC | PRN

## 2013-09-17 MED ORDER — FAMOTIDINE IN NACL 20-0.9 MG/50ML-% IV SOLN
20.0000 mg | Freq: Two times a day (BID) | INTRAVENOUS | Status: AC
  Administered 2013-09-18: 20 mg via INTRAVENOUS
  Filled 2013-09-17: qty 50

## 2013-09-17 MED ORDER — PHENYLEPHRINE HCL 10 MG/ML IJ SOLN
10.0000 mg | INTRAVENOUS | Status: DC | PRN
  Administered 2013-09-17: 30 ug/min via INTRAVENOUS
  Administered 2013-09-17: 3 ug/min via INTRAVENOUS

## 2013-09-17 MED ORDER — POTASSIUM CHLORIDE 10 MEQ/50ML IV SOLN: 10.0000 meq | INTRAVENOUS | Status: DC

## 2013-09-17 MED ORDER — MIDAZOLAM HCL 5 MG/5ML IJ SOLN
INTRAMUSCULAR | Status: DC | PRN
  Administered 2013-09-17 (×2): 2 mg via INTRAVENOUS

## 2013-09-17 MED ORDER — PLASMA-LYTE 148 IV SOLN
INTRAVENOUS | Status: DC
  Filled 2013-09-17: qty 2.5

## 2013-09-17 MED ORDER — METOPROLOL TARTRATE 25 MG/10 ML ORAL SUSPENSION
12.5000 mg | Freq: Two times a day (BID) | ORAL | Status: DC
  Filled 2013-09-17: qty 5

## 2013-09-17 MED ORDER — ACETAMINOPHEN 500 MG PO TABS
1000.0000 mg | ORAL_TABLET | Freq: Four times a day (QID) | ORAL | Status: DC
  Administered 2013-09-18 – 2013-09-22 (×16): 1000 mg via ORAL
  Filled 2013-09-17 (×21): qty 2

## 2013-09-17 MED ORDER — SODIUM CHLORIDE 0.9 % IJ SOLN
OROMUCOSAL | Status: DC | PRN
  Administered 2013-09-17: 22:00:00 via TOPICAL

## 2013-09-17 MED ORDER — LACTATED RINGERS IV SOLN: 500.0000 mL | Freq: Once | INTRAVENOUS | Status: AC | PRN

## 2013-09-17 MED ORDER — NITROGLYCERIN IN D5W 200-5 MCG/ML-% IV SOLN
2.0000 ug/min | INTRAVENOUS | Status: DC
  Filled 2013-09-17: qty 250

## 2013-09-17 MED ORDER — AMINOCAPROIC ACID 250 MG/ML IV SOLN
INTRAVENOUS | Status: DC | PRN
  Administered 2013-09-17: 5 g via INTRAVENOUS

## 2013-09-17 MED ORDER — EPINEPHRINE HCL 1 MG/ML IJ SOLN
0.5000 ug/min | INTRAMUSCULAR | Status: DC
  Filled 2013-09-17: qty 4

## 2013-09-17 MED ORDER — ACETAMINOPHEN 650 MG RE SUPP
650.0000 mg | Freq: Once | RECTAL | Status: AC
  Administered 2013-09-17: 650 mg via RECTAL

## 2013-09-17 MED ORDER — PANTOPRAZOLE SODIUM 40 MG PO TBEC
40.0000 mg | DELAYED_RELEASE_TABLET | Freq: Every day | ORAL | Status: DC
  Administered 2013-09-19 – 2013-09-22 (×4): 40 mg via ORAL
  Filled 2013-09-17 (×4): qty 1

## 2013-09-17 MED ORDER — METOPROLOL TARTRATE 1 MG/ML IV SOLN
2.5000 mg | INTRAVENOUS | Status: DC | PRN
  Administered 2013-09-19 – 2013-09-21 (×3): 5 mg via INTRAVENOUS
  Filled 2013-09-17: qty 5

## 2013-09-17 MED ORDER — SODIUM CHLORIDE 0.9 % IV SOLN
250.0000 mL | INTRAVENOUS | Status: DC
  Administered 2013-09-18: 250 mL via INTRAVENOUS

## 2013-09-17 MED ORDER — ASPIRIN 81 MG PO CHEW
324.0000 mg | CHEWABLE_TABLET | Freq: Every day | ORAL | Status: DC
  Filled 2013-09-17: qty 4

## 2013-09-17 MED ORDER — SODIUM CHLORIDE 0.9 % IV SOLN
INTRAVENOUS | Status: DC
  Administered 2013-09-17: 10 mL via INTRAVENOUS

## 2013-09-17 MED ORDER — DEXTROSE 5 % IV SOLN
1.5000 g | Freq: Two times a day (BID) | INTRAVENOUS | Status: DC
  Administered 2013-09-17: .75 g via INTRAVENOUS
  Administered 2013-09-17: 1.5 g via INTRAVENOUS
  Filled 2013-09-17 (×3): qty 1.5

## 2013-09-17 MED ORDER — DEXMEDETOMIDINE HCL IN NACL 400 MCG/100ML IV SOLN
0.1000 ug/kg/h | INTRAVENOUS | Status: DC
  Filled 2013-09-17: qty 100

## 2013-09-17 MED ORDER — PROPOFOL 10 MG/ML IV BOLUS
INTRAVENOUS | Status: DC | PRN
  Administered 2013-09-17: 100 mg via INTRAVENOUS

## 2013-09-17 MED ORDER — MIDAZOLAM HCL 2 MG/2ML IJ SOLN
INTRAMUSCULAR | Status: AC
  Filled 2013-09-17: qty 2

## 2013-09-17 MED ORDER — DOCUSATE SODIUM 100 MG PO CAPS
200.0000 mg | ORAL_CAPSULE | Freq: Every day | ORAL | Status: DC
  Administered 2013-09-19 – 2013-09-21 (×3): 200 mg via ORAL
  Filled 2013-09-17 (×3): qty 2

## 2013-09-17 MED ORDER — ACETAMINOPHEN 160 MG/5ML PO SOLN
650.0000 mg | Freq: Once | ORAL | Status: AC
  Administered 2013-09-18: 650 mg
  Filled 2013-09-17: qty 20.3

## 2013-09-17 MED ORDER — ASPIRIN EC 325 MG PO TBEC
325.0000 mg | DELAYED_RELEASE_TABLET | Freq: Every day | ORAL | Status: DC
  Administered 2013-09-19 – 2013-09-22 (×4): 325 mg via ORAL
  Filled 2013-09-17 (×5): qty 1

## 2013-09-17 MED ORDER — DEXTROSE 5 % IV SOLN
1.5000 g | Freq: Two times a day (BID) | INTRAVENOUS | Status: AC
  Administered 2013-09-18 – 2013-09-19 (×4): 1.5 g via INTRAVENOUS
  Filled 2013-09-17 (×4): qty 1.5

## 2013-09-17 MED ORDER — MIDAZOLAM HCL 5 MG/5ML IJ SOLN
INTRAMUSCULAR | Status: DC | PRN
  Administered 2013-09-17 (×2): 4 mg via INTRAVENOUS
  Administered 2013-09-17 (×2): 2 mg via INTRAVENOUS

## 2013-09-17 MED ORDER — ONDANSETRON HCL 4 MG/2ML IJ SOLN: 4.0000 mg | Freq: Four times a day (QID) | INTRAMUSCULAR | Status: DC | PRN

## 2013-09-17 MED ORDER — SODIUM CHLORIDE 0.9 % IV SOLN: 250.0000 mL | INTRAVENOUS | Status: DC

## 2013-09-17 MED ORDER — SODIUM CHLORIDE 0.45 % IV SOLN
INTRAVENOUS | Status: DC
  Administered 2013-09-17: 20 mL/h via INTRAVENOUS

## 2013-09-17 MED ORDER — BISACODYL 5 MG PO TBEC: 10.0000 mg | DELAYED_RELEASE_TABLET | Freq: Every day | ORAL | Status: DC

## 2013-09-17 MED ORDER — PHENYLEPHRINE HCL 10 MG/ML IJ SOLN
30.0000 ug/min | INTRAVENOUS | Status: DC
  Filled 2013-09-17: qty 2

## 2013-09-17 MED ORDER — SODIUM CHLORIDE 0.9 % IJ SOLN: 3.0000 mL | INTRAMUSCULAR | Status: DC | PRN

## 2013-09-17 MED ORDER — DOPAMINE-DEXTROSE 3.2-5 MG/ML-% IV SOLN
2.0000 ug/kg/min | INTRAVENOUS | Status: DC
  Filled 2013-09-17: qty 250

## 2013-09-17 MED ORDER — POTASSIUM CHLORIDE 10 MEQ/50ML IV SOLN: 10.0000 meq | INTRAVENOUS | Status: AC

## 2013-09-17 MED ORDER — ACETAMINOPHEN 160 MG/5ML PO SOLN: 650.0000 mg | Freq: Once | ORAL | Status: AC

## 2013-09-17 MED ORDER — DEXMEDETOMIDINE HCL IN NACL 200 MCG/50ML IV SOLN
INTRAVENOUS | Status: DC | PRN
  Administered 2013-09-17: 0.3 ug/kg/h via INTRAVENOUS

## 2013-09-17 MED ORDER — MAGNESIUM SULFATE 40 MG/ML IJ SOLN: 4.0000 g | Freq: Once | INTRAMUSCULAR | Status: AC

## 2013-09-17 MED ORDER — LACTATED RINGERS IV SOLN
INTRAVENOUS | Status: DC | PRN
  Administered 2013-09-17: 22:00:00 via INTRAVENOUS

## 2013-09-17 MED ORDER — MAGNESIUM SULFATE 40 MG/ML IJ SOLN
INTRAMUSCULAR | Status: AC
  Filled 2013-09-17: qty 100

## 2013-09-17 MED ORDER — ACETAMINOPHEN 160 MG/5ML PO SOLN: 1000.0000 mg | Freq: Four times a day (QID) | ORAL | Status: DC

## 2013-09-17 MED ORDER — METOPROLOL TARTRATE 25 MG/10 ML ORAL SUSPENSION
12.5000 mg | Freq: Two times a day (BID) | ORAL | Status: DC
  Filled 2013-09-17 (×6): qty 5

## 2013-09-17 MED ORDER — VANCOMYCIN HCL IN DEXTROSE 1-5 GM/200ML-% IV SOLN
1000.0000 mg | Freq: Once | INTRAVENOUS | Status: AC
  Administered 2013-09-17: 1000 mg via INTRAVENOUS
  Filled 2013-09-17: qty 200

## 2013-09-17 MED ORDER — SODIUM CHLORIDE 0.9 % IR SOLN
Status: DC | PRN
  Administered 2013-09-17: 1000 mL

## 2013-09-17 MED ORDER — ALBUMIN HUMAN 5 % IV SOLN
250.0000 mL | INTRAVENOUS | Status: AC | PRN
  Administered 2013-09-17 (×4): 250 mL via INTRAVENOUS
  Filled 2013-09-17 (×2): qty 250

## 2013-09-17 MED ORDER — MORPHINE SULFATE 2 MG/ML IJ SOLN
2.0000 mg | INTRAMUSCULAR | Status: DC | PRN
  Administered 2013-09-17: 4 mg via INTRAVENOUS
  Filled 2013-09-17: qty 2

## 2013-09-17 MED ORDER — ASPIRIN EC 325 MG PO TBEC: 325.0000 mg | DELAYED_RELEASE_TABLET | Freq: Every day | ORAL | Status: DC

## 2013-09-17 MED FILL — Heparin Sodium (Porcine) Inj 1000 Unit/ML: INTRAMUSCULAR | Qty: 20 | Status: AC

## 2013-09-17 MED FILL — Sodium Chloride IV Soln 0.9%: INTRAVENOUS | Qty: 3000 | Status: AC

## 2013-09-17 MED FILL — Lidocaine HCl IV Inj 20 MG/ML: INTRAVENOUS | Qty: 5 | Status: AC

## 2013-09-17 MED FILL — Electrolyte-R (PH 7.4) Solution: INTRAVENOUS | Qty: 4000 | Status: AC

## 2013-09-17 MED FILL — Sodium Bicarbonate IV Soln 8.4%: INTRAVENOUS | Qty: 50 | Status: AC

## 2013-09-17 MED FILL — Mannitol IV Soln 20%: INTRAVENOUS | Qty: 500 | Status: AC

## 2013-09-17 SURGICAL SUPPLY — 94 items
ATTRACTOMAT 16X20 MAGNETIC DRP (DRAPES) ×2 IMPLANT
BAG DECANTER FOR FLEXI CONT (MISCELLANEOUS) ×2 IMPLANT
BANDAGE ELASTIC 4 VELCRO ST LF (GAUZE/BANDAGES/DRESSINGS) ×2 IMPLANT
BANDAGE ELASTIC 6 VELCRO ST LF (GAUZE/BANDAGES/DRESSINGS) ×2 IMPLANT
BANDAGE GAUZE ELAST BULKY 4 IN (GAUZE/BANDAGES/DRESSINGS) ×2 IMPLANT
BASKET HEART (ORDER IN 25'S) (MISCELLANEOUS) ×1
BASKET HEART (ORDER IN 25S) (MISCELLANEOUS) ×1 IMPLANT
BENZOIN TINCTURE PRP APPL 2/3 (GAUZE/BANDAGES/DRESSINGS) ×8 IMPLANT
BLADE STERNUM SYSTEM 6 (BLADE) ×2 IMPLANT
BLADE SURG 11 STRL SS (BLADE) ×4 IMPLANT
CANISTER SUCTION 2500CC (MISCELLANEOUS) ×2 IMPLANT
CANNULA EZ GLIDE AORTIC 21FR (CANNULA) ×2 IMPLANT
CANNULA VENOUS LOW PROF 34X46 (CANNULA) IMPLANT
CANNULA VESSEL 3MM BLUNT TIP (CANNULA) ×6 IMPLANT
CARDIAC SUCTION (MISCELLANEOUS) ×2 IMPLANT
CATH CPB KIT HENDRICKSON (MISCELLANEOUS) ×2 IMPLANT
CATH ROBINSON RED A/P 18FR (CATHETERS) ×2 IMPLANT
CATH THORACIC 36FR (CATHETERS) ×2 IMPLANT
CATH THORACIC 36FR RT ANG (CATHETERS) ×2 IMPLANT
CLIP TI MEDIUM 24 (CLIP) IMPLANT
CLIP TI WIDE RED SMALL 24 (CLIP) ×4 IMPLANT
COVER SURGICAL LIGHT HANDLE (MISCELLANEOUS) ×2 IMPLANT
CRADLE DONUT ADULT HEAD (MISCELLANEOUS) ×2 IMPLANT
DRAPE CARDIOVASCULAR INCISE (DRAPES) ×1
DRAPE SLUSH/WARMER DISC (DRAPES) ×2 IMPLANT
DRAPE SRG 135X102X78XABS (DRAPES) ×1 IMPLANT
DRSG AQUACEL AG ADV 3.5X14 (GAUZE/BANDAGES/DRESSINGS) ×2 IMPLANT
DRSG COVADERM 4X14 (GAUZE/BANDAGES/DRESSINGS) ×2 IMPLANT
ELECT REM PT RETURN 9FT ADLT (ELECTROSURGICAL) ×4
ELECTRODE REM PT RTRN 9FT ADLT (ELECTROSURGICAL) ×2 IMPLANT
GLOVE BIO SURGEON STRL SZ 6 (GLOVE) ×4 IMPLANT
GLOVE BIO SURGEON STRL SZ 6.5 (GLOVE) ×8 IMPLANT
GLOVE BIO SURGEON STRL SZ7 (GLOVE) ×8 IMPLANT
GLOVE BIOGEL PI IND STRL 6.5 (GLOVE) ×1 IMPLANT
GLOVE BIOGEL PI IND STRL 7.0 (GLOVE) ×4 IMPLANT
GLOVE BIOGEL PI INDICATOR 6.5 (GLOVE) ×1
GLOVE BIOGEL PI INDICATOR 7.0 (GLOVE) ×4
GLOVE EUDERMIC 7 POWDERFREE (GLOVE) ×6 IMPLANT
GOWN PREVENTION PLUS XLARGE (GOWN DISPOSABLE) ×10 IMPLANT
GOWN STRL NON-REIN LRG LVL3 (GOWN DISPOSABLE) ×14 IMPLANT
HEMOSTAT POWDER SURGIFOAM 1G (HEMOSTASIS) ×6 IMPLANT
HEMOSTAT SURGICEL 2X14 (HEMOSTASIS) ×2 IMPLANT
INSERT FOGARTY 61MM (MISCELLANEOUS) ×2 IMPLANT
INSERT FOGARTY XLG (MISCELLANEOUS) IMPLANT
KIT BASIN OR (CUSTOM PROCEDURE TRAY) ×2 IMPLANT
KIT ROOM TURNOVER OR (KITS) ×2 IMPLANT
KIT SUCTION CATH 14FR (SUCTIONS) ×4 IMPLANT
KIT VASOVIEW W/TROCAR VH 2000 (KITS) ×2 IMPLANT
MARKER GRAFT CORONARY BYPASS (MISCELLANEOUS) ×6 IMPLANT
NS IRRIG 1000ML POUR BTL (IV SOLUTION) ×10 IMPLANT
PACK OPEN HEART (CUSTOM PROCEDURE TRAY) ×2 IMPLANT
PAD ARMBOARD 7.5X6 YLW CONV (MISCELLANEOUS) ×4 IMPLANT
PAD ELECT DEFIB RADIOL ZOLL (MISCELLANEOUS) ×2 IMPLANT
PENCIL BUTTON HOLSTER BLD 10FT (ELECTRODE) ×2 IMPLANT
PUNCH AORTIC ROTATE 4.0MM (MISCELLANEOUS) IMPLANT
PUNCH AORTIC ROTATE 4.5MM 8IN (MISCELLANEOUS) ×2 IMPLANT
PUNCH AORTIC ROTATE 5MM 8IN (MISCELLANEOUS) IMPLANT
SET CARDIOPLEGIA MPS 5001102 (MISCELLANEOUS) ×2 IMPLANT
SPONGE GAUZE 4X4 12PLY (GAUZE/BANDAGES/DRESSINGS) ×4 IMPLANT
STRIP CLOSURE SKIN 1/2X4 (GAUZE/BANDAGES/DRESSINGS) ×2 IMPLANT
SUT BONE WAX W31G (SUTURE) ×2 IMPLANT
SUT MNCRL AB 4-0 PS2 18 (SUTURE) ×2 IMPLANT
SUT PROLENE 3 0 SH DA (SUTURE) ×2 IMPLANT
SUT PROLENE 4 0 RB 1 (SUTURE) ×1
SUT PROLENE 4 0 SH DA (SUTURE) IMPLANT
SUT PROLENE 4-0 RB1 .5 CRCL 36 (SUTURE) ×1 IMPLANT
SUT PROLENE 6 0 C 1 30 (SUTURE) ×4 IMPLANT
SUT PROLENE 6 0 C1 VI BL (SUTURE) ×2 IMPLANT
SUT PROLENE 7 0 BV1 MDA (SUTURE) ×4 IMPLANT
SUT PROLENE 8 0 BV175 6 (SUTURE) IMPLANT
SUT SILK  1 MH (SUTURE)
SUT SILK 1 MH (SUTURE) IMPLANT
SUT STEEL 6MS V (SUTURE) IMPLANT
SUT STEEL STERNAL CCS#1 18IN (SUTURE) IMPLANT
SUT STEEL SZ 6 DBL 3X14 BALL (SUTURE) IMPLANT
SUT VIC AB 1 CTX 36 (SUTURE) ×2
SUT VIC AB 1 CTX36XBRD ANBCTR (SUTURE) ×2 IMPLANT
SUT VIC AB 2-0 CT1 27 (SUTURE) ×1
SUT VIC AB 2-0 CT1 TAPERPNT 27 (SUTURE) ×1 IMPLANT
SUT VIC AB 2-0 CTX 27 (SUTURE) IMPLANT
SUT VIC AB 3-0 SH 27 (SUTURE)
SUT VIC AB 3-0 SH 27X BRD (SUTURE) IMPLANT
SUT VIC AB 3-0 X1 27 (SUTURE) IMPLANT
SUT VICRYL 4-0 PS2 18IN ABS (SUTURE) IMPLANT
SUTURE E-PAK OPEN HEART (SUTURE) ×2 IMPLANT
SYSTEM SAHARA CHEST DRAIN ATS (WOUND CARE) ×2 IMPLANT
TAPE CLOTH SURG 4X10 WHT LF (GAUZE/BANDAGES/DRESSINGS) ×2 IMPLANT
TOWEL OR 17X24 6PK STRL BLUE (TOWEL DISPOSABLE) ×4 IMPLANT
TOWEL OR 17X26 10 PK STRL BLUE (TOWEL DISPOSABLE) ×4 IMPLANT
TRAY FOLEY IC TEMP SENS 14FR (CATHETERS) ×2 IMPLANT
TUBE FEEDING 8FR 16IN STR KANG (MISCELLANEOUS) ×2 IMPLANT
TUBING INSUFFLATION 10FT LAP (TUBING) ×2 IMPLANT
UNDERPAD 30X30 INCONTINENT (UNDERPADS AND DIAPERS) ×2 IMPLANT
WATER STERILE IRR 1000ML POUR (IV SOLUTION) ×4 IMPLANT

## 2013-09-17 SURGICAL SUPPLY — 91 items
ADAPTER CARDIO PERF ANTE/RETRO (ADAPTER) IMPLANT
APPLIER CLIP 9.375 MED OPEN (MISCELLANEOUS)
APPLIER CLIP 9.375 SM OPEN (CLIP)
ATTRACTOMAT 16X20 MAGNETIC DRP (DRAPES) ×2 IMPLANT
BAG DECANTER FOR FLEXI CONT (MISCELLANEOUS) IMPLANT
BANDAGE ELASTIC 4 VELCRO ST LF (GAUZE/BANDAGES/DRESSINGS) IMPLANT
BANDAGE ELASTIC 6 VELCRO ST LF (GAUZE/BANDAGES/DRESSINGS) IMPLANT
BANDAGE GAUZE ELAST BULKY 4 IN (GAUZE/BANDAGES/DRESSINGS) IMPLANT
BLADE CORE FAN STRYKER (BLADE) IMPLANT
BLADE SAW SAG 29X58X.64 (BLADE) IMPLANT
BLADE STERNUM SYSTEM 6 (BLADE) IMPLANT
BLADE SURG 10 STRL SS (BLADE) ×2 IMPLANT
BLADE SURG ROTATE 9660 (MISCELLANEOUS) IMPLANT
CANISTER SUCTION 2500CC (MISCELLANEOUS) ×2 IMPLANT
CANNULA GUNDRY RCSP 15FR (MISCELLANEOUS) IMPLANT
CATH ROBINSON RED A/P 18FR (CATHETERS) IMPLANT
CATH THORACIC 36FR RT ANG (CATHETERS) IMPLANT
CLIP APPLIE 9.375 MED OPEN (MISCELLANEOUS) IMPLANT
CLIP APPLIE 9.375 SM OPEN (CLIP) IMPLANT
CLIP FOGARTY SPRING 6M (CLIP) IMPLANT
CLIP TI MEDIUM 24 (CLIP) IMPLANT
CLIP TI WIDE RED SMALL 24 (CLIP) IMPLANT
CONN Y 3/8X3/8X3/8  BEN (MISCELLANEOUS)
CONN Y 3/8X3/8X3/8 BEN (MISCELLANEOUS) IMPLANT
COVER MAYO STAND STRL (DRAPES) ×2 IMPLANT
COVER SURGICAL LIGHT HANDLE (MISCELLANEOUS) ×2 IMPLANT
CRADLE DONUT ADULT HEAD (MISCELLANEOUS) ×2 IMPLANT
DRAPE CARDIOVASCULAR INCISE (DRAPES) ×1
DRAPE SLUSH/WARMER DISC (DRAPES) ×2 IMPLANT
DRAPE SRG 135X102X78XABS (DRAPES) ×1 IMPLANT
DRSG COVADERM 4X14 (GAUZE/BANDAGES/DRESSINGS) ×2 IMPLANT
ELECT REM PT RETURN 9FT ADLT (ELECTROSURGICAL) ×4
ELECTRODE REM PT RTRN 9FT ADLT (ELECTROSURGICAL) ×2 IMPLANT
GLOVE BIO SURGEON STRL SZ 6 (GLOVE) ×2 IMPLANT
GLOVE BIO SURGEON STRL SZ 6.5 (GLOVE) ×4 IMPLANT
GLOVE BIOGEL PI IND STRL 6 (GLOVE) ×2 IMPLANT
GLOVE BIOGEL PI IND STRL 6.5 (GLOVE) ×5 IMPLANT
GLOVE BIOGEL PI INDICATOR 6 (GLOVE) ×2
GLOVE BIOGEL PI INDICATOR 6.5 (GLOVE) ×5
GLOVE EUDERMIC 7 POWDERFREE (GLOVE) ×4 IMPLANT
GOWN PREVENTION PLUS XLARGE (GOWN DISPOSABLE) ×4 IMPLANT
GOWN STRL NON-REIN LRG LVL3 (GOWN DISPOSABLE) ×10 IMPLANT
HEMOSTAT POWDER SURGIFOAM 1G (HEMOSTASIS) ×4 IMPLANT
HEMOSTAT SURGICEL 2X14 (HEMOSTASIS) ×2 IMPLANT
INSERT FOGARTY 61MM (MISCELLANEOUS) ×2 IMPLANT
INSERT FOGARTY XLG (MISCELLANEOUS) IMPLANT
KIT BASIN OR (CUSTOM PROCEDURE TRAY) ×2 IMPLANT
KIT ROOM TURNOVER OR (KITS) ×2 IMPLANT
KIT SUCTION CATH 14FR (SUCTIONS) ×2 IMPLANT
KIT VASOVIEW W/TROCAR VH 2000 (KITS) IMPLANT
MARKER GRAFT CORONARY BYPASS (MISCELLANEOUS) IMPLANT
NS IRRIG 1000ML POUR BTL (IV SOLUTION) ×12 IMPLANT
PACK OPEN HEART (CUSTOM PROCEDURE TRAY) ×2 IMPLANT
PAD ARMBOARD 7.5X6 YLW CONV (MISCELLANEOUS) ×6 IMPLANT
PAD DEFIB R2 (MISCELLANEOUS) ×2 IMPLANT
PAD ELECT DEFIB RADIOL ZOLL (MISCELLANEOUS) ×2 IMPLANT
PEDIATRIC SUCKERS (MISCELLANEOUS) IMPLANT
PENCIL BUTTON HOLSTER BLD 10FT (ELECTRODE) IMPLANT
PUNCH AORTIC ROTATE 4.0MM (MISCELLANEOUS) IMPLANT
PUNCH AORTIC ROTATE 4.5MM 8IN (MISCELLANEOUS) IMPLANT
PUNCH AORTIC ROTATE 5MM 8IN (MISCELLANEOUS) IMPLANT
SOLUTION ANTI FOG 6CC (MISCELLANEOUS) IMPLANT
SPONGE GAUZE 4X4 12PLY (GAUZE/BANDAGES/DRESSINGS) ×2 IMPLANT
SUT PROLENE 3 0 SH DA (SUTURE) ×2 IMPLANT
SUT PROLENE 4 0 RB 1 (SUTURE)
SUT PROLENE 4-0 RB1 .5 CRCL 36 (SUTURE) IMPLANT
SUT PROLENE 6 0 C 1 30 (SUTURE) IMPLANT
SUT PROLENE 8 0 BV175 6 (SUTURE) IMPLANT
SUT SILK  1 MH (SUTURE)
SUT SILK 1 MH (SUTURE) IMPLANT
SUT SILK 1 TIES 10X30 (SUTURE) IMPLANT
SUT STEEL 6MS V (SUTURE) ×4 IMPLANT
SUT STEEL STERNAL CCS#1 18IN (SUTURE) IMPLANT
SUT STEEL SZ 6 DBL 3X14 BALL (SUTURE) ×2 IMPLANT
SUT TEM PAC WIRE 2 0 SH (SUTURE) IMPLANT
SUT VIC AB 1 CTX 36 (SUTURE) ×4
SUT VIC AB 1 CTX36XBRD ANBCTR (SUTURE) ×4 IMPLANT
SUT VIC AB 2-0 CT1 27 (SUTURE)
SUT VIC AB 2-0 CT1 TAPERPNT 27 (SUTURE) IMPLANT
SUT VIC AB 2-0 CTX 27 (SUTURE) IMPLANT
SUT VIC AB 3-0 X1 27 (SUTURE) IMPLANT
SUTURE E-PAK OPEN HEART (SUTURE) ×2 IMPLANT
SYSTEM SAHARA CHEST DRAIN ATS (WOUND CARE) ×2 IMPLANT
TAPE CLOTH SURG 4X10 WHT LF (GAUZE/BANDAGES/DRESSINGS) ×2 IMPLANT
TAPE PAPER 2X10 WHT MICROPORE (GAUZE/BANDAGES/DRESSINGS) ×2 IMPLANT
TOWEL OR 17X24 6PK STRL BLUE (TOWEL DISPOSABLE) ×4 IMPLANT
TOWEL OR 17X26 10 PK STRL BLUE (TOWEL DISPOSABLE) ×4 IMPLANT
TRAY FOLEY IC TEMP SENS 14FR (CATHETERS) IMPLANT
TUBING INSUFFLATION 10FT LAP (TUBING) IMPLANT
UNDERPAD 30X30 INCONTINENT (UNDERPADS AND DIAPERS) ×2 IMPLANT
WATER STERILE IRR 1000ML POUR (IV SOLUTION) ×4 IMPLANT

## 2013-09-17 NOTE — Transfer of Care (Signed)
Immediate Anesthesia Transfer of Care Note  Patient: Dalton Mitchell  Procedure(s) Performed: Procedure(s): EXPLORATION POST OPERATIVE OPEN HEART (N/A)  Patient Location: SICU  Anesthesia Type:General  Level of Consciousness: Patient remains intubated per anesthesia plan  Airway & Oxygen Therapy: Patient remains intubated per anesthesia plan and Patient placed on Ventilator (see vital sign flow sheet for setting)  Post-op Assessment: Report given to PACU RN and Post -op Vital signs reviewed and stable  Post vital signs: Reviewed and stable  Complications: No apparent anesthesia complications

## 2013-09-17 NOTE — Preoperative (Signed)
Beta Blockers   Reason not to administer Beta Blockers:Not Applicable 

## 2013-09-17 NOTE — Interval H&P Note (Signed)
History and Physical Interval Note:  Mr. Dalton Mitchell underwent CABG x 4 earlier today. He arrived in SICU at ~ 1245. He initially had little bleeding(~50 ml/hr). However, at about 6 PM he had a marked increase in CT output. He has remained hemodynamically stable but has had 3 hours of > 200 ml/ hr from CT. He is not coagulopathic. In my opinion he needs mediastinal re-exploration to control bleeding. I spoke with his wife by phone as well as Dalton Mitchell in person. He is intubated but was alert when I told him the plan. Dalton Mitchell gives consent for the procedure. The OR was notified earlier at the time the decision was made.   09/17/2013 9:29 PM  Dalton Mitchell  has presented today for surgery, with the diagnosis of Bleeding  The various methods of treatment have been discussed with the patient and family. After consideration of risks, benefits and other options for treatment, the patient has consented to  Procedure(s): REDO CORONARY ARTERY BYPASS GRAFTING (CABG) (N/A) as a surgical intervention .  The patient's history has been reviewed, patient examined, no change in status, stable for surgery.  I have reviewed the patient's chart and labs.  Questions were answered to the patient's satisfaction.     Dalton Mitchell

## 2013-09-17 NOTE — Anesthesia Postprocedure Evaluation (Signed)
  Anesthesia Post-op Note  Patient: Dalton Mitchell  Procedure(s) Performed: Procedure(s): CORONARY ARTERY BYPASS GRAFTING (CABG) (N/A)  Patient Location: SICU  Anesthesia Type:General  Level of Consciousness: sedated  Airway and Oxygen Therapy: Patient remains intubated per anesthesia plan and Patient placed on Ventilator (see vital sign flow sheet for setting)  Post-op Pain: none  Post-op Assessment: Post-op Vital signs reviewed, Patient's Cardiovascular Status Stable, Respiratory Function Stable, Patent Airway, No signs of Nausea or vomiting and Pain level controlled  Post-op Vital Signs: Reviewed and stable  Complications: No apparent anesthesia complications

## 2013-09-17 NOTE — Progress Notes (Signed)
Switched patient back to full support per Dr. Roxan Hockey. Pt ABG not within parameters to continue wean. RT will monitor.

## 2013-09-17 NOTE — Interval H&P Note (Signed)
History and Physical Interval Note:  09/17/2013 7:16 AM  Dalton Mitchell  has presented today for surgery, with the diagnosis of CAD  The various methods of treatment have been discussed with the patient and family. After consideration of risks, benefits and other options for treatment, the patient has consented to  Procedure(s): CORONARY ARTERY BYPASS GRAFTING (CABG) (N/A) as a surgical intervention .  The patient's history has been reviewed, patient examined, no change in status, stable for surgery.  I have reviewed the patient's chart and labs.  Questions were answered to the patient's satisfaction.     Woodrow Dulski C

## 2013-09-17 NOTE — Anesthesia Procedure Notes (Signed)
Date/Time: 09/17/2013 10:19 PM Performed by: Vaughan Browner Pre-anesthesia Checklist: Patient identified, Emergency Drugs available, Suction available and Patient being monitored Patient Re-evaluated:Patient Re-evaluated prior to inductionOxygen Delivery Method: Circle system utilized Intubation Type: Inhalational induction with existing ETT Placement Confirmation: positive ETCO2 and breath sounds checked- equal and bilateral

## 2013-09-17 NOTE — Anesthesia Procedure Notes (Signed)
Procedure Name: Intubation Date/Time: 09/17/2013 7:35 AM Performed by: Maeola Harman Pre-anesthesia Checklist: Patient identified, Emergency Drugs available, Suction available, Patient being monitored and Timeout performed Patient Re-evaluated:Patient Re-evaluated prior to inductionOxygen Delivery Method: Circle system utilized Preoxygenation: Pre-oxygenation with 100% oxygen Intubation Type: IV induction, Rapid sequence and Cricoid Pressure applied Ventilation: Mask ventilation without difficulty Laryngoscope Size: Mac and 4 Grade View: Grade III Tube type: Oral Tube size: 8.0 mm Number of attempts: 1 Airway Equipment and Method: Stylet Placement Confirmation: ETT inserted through vocal cords under direct vision,  positive ETCO2 and breath sounds checked- equal and bilateral Secured at: 23 cm Tube secured with: Tape Dental Injury: Teeth and Oropharynx as per pre-operative assessment  Comments: Easy induction and mask, DL x 1 with MAC 4 blade.  Dr. Albertina Parr verified placement of ETT.   Waldron Session, CRNA

## 2013-09-17 NOTE — Brief Op Note (Signed)
09/13/2013 - 09/17/2013  3:17 PM  PATIENT:  [name]  67 y.o. male  PRE-OPERATIVE DIAGNOSIS:  3 VESSEL CAD, UNSTABLE ANGINA  POST-OPERATIVE DIAGNOSIS: 3 VESSEL CAD, UNSTABLE ANGINA  PROCEDURE:  Procedure(s): CORONARY ARTERY BYPASS GRAFTING (CABG) (N/A) x 4 LIMA to LAD SVG to OM2 Sequential SVG to PD and distal RCA  SURGEON:  Surgeon(s) and Role:    * Melrose Nakayama, MD - Primary  PHYSICIAN ASSISTANT:   Suzzanne Cloud, PA-C  ANESTHESIA:   general  EBL:  Total I/O In: 3910.1 [I.V.:2235.1; Blood:1075; IV [id number] Out: [id number] [Urine:2500; Blood:1625; Chest Tube:210]  BLOOD ADMINISTERED:none  DRAINS: 2 Chest Tube(s) in the left pleura and mediastinum   LOCAL MEDICATIONS USED:  NONE  SPECIMEN:  No Specimen  DISPOSITION OF SPECIMEN:  N/A  COUNTS:  YES  PLAN OF CARE: Admit to inpatient   PATIENT DISPOSITION:  ICU - intubated and hemodynamically stable.   Delay start of Pharmacological VTE agent (>24hrs) due to surgical blood loss or risk of bleeding: not applicable  XC= 70 min  CPB= 108 min  Good targets, good conduits

## 2013-09-17 NOTE — Brief Op Note (Addendum)
09/13/2013 - 09/17/2013  11:14 PM  PATIENT:  Dalton Mitchell  67 y.o. male  PRE-OPERATIVE DIAGNOSIS:  Postoperative Bleeding following CABG  POST-OPERATIVE DIAGNOSIS:  Postoperative Bleeding following CABG  PROCEDURE:  Procedure(s): EXPLORATION POST OPERATIVE OPEN HEART (N/A)  SURGEON:  Surgeon(s) and Role:    * Melrose Nakayama, MD - Primary   ASSISTANTS: Ara Kussmaul, RNFA   ANESTHESIA:   general  EBL:  Total I/O In: 1513.3 [I.V.:553.3; Blood:260; IV Piggyback:700] Out: 35 [Urine:500; Chest Tube:800]  BLOOD ADMINISTERED:none  DRAINS: 2 Chest Tube(s) in the left pleura and mediastinum   LOCAL MEDICATIONS USED:  NONE  SPECIMEN:  No Specimen  DISPOSITION OF SPECIMEN:  N/A  COUNTS:  YES  PLAN OF CARE: Admit to inpatient   PATIENT DISPOSITION:  ICU - intubated and hemodynamically stable.   Delay start of Pharmacological VTE agent (>24hrs) due to surgical blood loss or risk of bleeding: yes  Dictation # [id number]

## 2013-09-17 NOTE — Anesthesia Preprocedure Evaluation (Addendum)
Anesthesia Evaluation  Patient identified by MRN, date of birth, ID band Patient awake    Reviewed: Allergy & Precautions, H&P , NPO status , Patient's Chart, lab work & pertinent test results, reviewed documented beta blocker date and time   Airway Mallampati: II TM Distance: >3 FB Neck ROM: full    Dental  (+) Teeth Intact and Dental Advisory Given   Pulmonary neg pulmonary ROS, sleep apnea , former smoker,  breath sounds clear to auscultation        Cardiovascular hypertension, On Medications and On Home Beta Blockers + angina with exertion + CAD, + Past MI and + Cardiac Stents Rhythm:regular     Neuro/Psych negative neurological ROS  negative psych ROS   GI/Hepatic Neg liver ROS, GERD-  Medicated and Controlled,  Endo/Other  Hypothyroidism Morbid obesity  Renal/GU Renal InsufficiencyRenal disease  negative genitourinary   Musculoskeletal   Abdominal (+)  Abdomen: soft. Bowel sounds: normal.  Peds  Hematology negative hematology ROS (+)   Anesthesia Other Findings See surgeon's H&P   Reproductive/Obstetrics negative OB ROS                         Anesthesia Physical Anesthesia Plan  ASA: IV  Anesthesia Plan: General   Post-op Pain Management:    Induction: Intravenous  Airway Management Planned: Oral ETT  Additional Equipment: Arterial line, PA Cath, CVP, TEE and Ultrasound Guidance Line Placement  Intra-op Plan:   Post-operative Plan: Post-operative intubation/ventilation  Informed Consent: I have reviewed the patients History and Physical, chart, labs and discussed the procedure including the risks, benefits and alternatives for the proposed anesthesia with the patient or authorized representative who has indicated his/her understanding and acceptance.   Dental Advisory Given  Plan Discussed with: CRNA and Surgeon  Anesthesia Plan Comments:         Anesthesia Quick  Evaluation

## 2013-09-17 NOTE — Progress Notes (Signed)
Attempting to wean per SICU protocol.  Sats 91-93% will continue to monitor.  RN aware.

## 2013-09-17 NOTE — Addendum Note (Signed)
Addendum created 09/17/13 1301 by Maeola Harman, CRNA   Modules edited: Anesthesia LDA

## 2013-09-17 NOTE — Anesthesia Preprocedure Evaluation (Addendum)
Anesthesia Evaluation    Airway       Dental   Pulmonary former smoker,          Cardiovascular hypertension,     Neuro/Psych    GI/Hepatic   Endo/Other    Renal/GU      Musculoskeletal   Abdominal   Peds  Hematology   Anesthesia Other Findings   Reproductive/Obstetrics                         Anesthesia Physical Anesthesia Plan  ASA: IV and emergent  Anesthesia Plan: General   Post-op Pain Management:    Induction: Inhalational  Airway Management Planned: Oral ETT  Additional Equipment: Arterial line, CVP, PA Cath and 3D TEE  Intra-op Plan:   Post-operative Plan: Post-operative intubation/ventilation  Informed Consent: I have reviewed the patients History and Physical, chart, labs and discussed the procedure including the risks, benefits and alternatives for the proposed anesthesia with the patient or authorized representative who has indicated his/her understanding and acceptance.     Plan Discussed with:   Anesthesia Plan Comments:         Anesthesia Quick Evaluation

## 2013-09-17 NOTE — Progress Notes (Signed)
Patient ID: Dalton Mitchell, male   DOB: 1946-08-19, 67 y.o.   MRN: [id number] S:pt intubated and sedated after CABG O:BP 93/67  Pulse 90  Temp(Src) 97.5 F (36.4 C) (Core (Comment))  Resp 12  Ht 5\' 9"  (1.753 m)  Wt 111.131 kg (245 lb)  BMI 36.16 kg/m2  SpO2 93%  Intake/Output Summary (Last 24 hours) at 09/17/13 1605 Last data filed at 09/17/13 1500  Gross per 24 hour  Intake 3918.1 ml  Output   4585 ml  Net -666.9 ml   Intake/Output: I/O last 3 completed shifts: In: 480 [P.O.:480] Out: -   Intake/Output this shift:  Total I/O In: 3918.1 [I.V.:2243.1; Blood:1075; IV [id number] Out: [id number] [Urine:2700; Blood:1625; Chest Tube:260] Weight change:  Gen:WD obese WM intubated and sedated CVS:rrr Resp:occ rhonchi [id number], +BS, soft [id number] leg wrapped, no edema on left   Recent Labs Lab 09/13/13 1040 09/13/13 1252 09/14/13 0650 09/15/13 0825 09/16/13 0415 09/17/13 0430 09/17/13 0756 09/17/13 0927 09/17/13 0954 09/17/13 1053 09/17/13 1148 09/17/13 1254  NA 139 141 143 140 141 141 139 141 137 137  135 137  K 4.1 4.4 4.0 4.2 4.4 4.0 4.3 4.0 4.0 5.1 5.4* 4.9  CL 105 105 109 108 108 106  --   --   --   --   --   --   CO2 22 24 24 22 23 23   --   --   --   --   --   --   GLUCOSE 98 92 105* 108* 91 126* 126* 96 85 121* 127* 129*  BUN 30* 29* 28* 25* 31* 32*  --   --   --   --   --   --   CREATININE 1.93* 1.93* 1.91* 1.78* 1.94* 1.92*  --   --   --   --   --   --   ALBUMIN 4.1 4.2  --   --   --   --   --   --   --   --   --   --   CALCIUM 9.6 9.4 9.0 9.4 9.5 9.6  --   --   --   --   --   --   AST 20 21  --   --   --   --   --   --   --   --   --   --   ALT 23 20  --   --   --   --   --   --   --   --   --   --    Liver Function Tests:  Recent Labs Lab 09/13/13 1040 09/13/13 1252  AST 20 21  ALT 23 20  ALKPHOS 57 57  BILITOT 0.4 0.4  PROT 7.6 7.6  ALBUMIN 4.1 4.2   No results found for this basename: LIPASE, AMYLASE,  in the last 168 hours No results  found for this basename: AMMONIA,  in the last 168 hours CBC:  Recent Labs Lab 09/13/13 1040 09/14/13 0650 09/15/13 0825 09/16/13 0415 09/17/13 0430  09/17/13 1045  09/17/13 1148 09/17/13 1254 09/17/13 1330  WBC 6.6 6.0 6.6 6.7 6.9  --   --   --   --   --  6.4  NEUTROABS 4.1  --   --   --   --   --   --   --   --   --   --   HGB 16.5  15.8 16.6 15.8 15.3  < > 11.8*  < > 12.2* 14.6 14.6  HCT 48.4 47.3 48.7 47.0 45.8  < > 35.3*  < > 36.0* 43.0 42.4  MCV 95.1 94.0 95.7 95.5 95.2  --   --   --   --   --  93.4  PLT 178 176 162 166 156  --  106*  --   --   --  96*  < > = values in this interval not displayed. Cardiac Enzymes:  Recent Labs Lab 09/13/13 1040 09/13/13 1254 09/13/13 1800 09/14/13 0020  TROPONINI <0.30 <0.30 <0.30 <0.30   CBG: No results found for this basename: GLUCAP,  in the last 168 hours  Iron Studies: No results found for this basename: IRON, TIBC, TRANSFERRIN, FERRITIN,  in the last 72 hours Studies/Results: Dg Chest Portable 1 View  09/17/2013   CLINICAL DATA:  Post CABG.  EXAM: PORTABLE CHEST - 1 VIEW  COMPARISON:  09/13/2013.  FINDINGS: Endotracheal tube terminates approximately 2 cm above the carina. Nasogastric tube is followed into the stomach. Side port appears to be in the region of the gastroesophageal junction. Mediastinal drain and left chest tube are in place. Right IJ Swan-Ganz catheter tip projects over the right pulmonary artery. Epicardial pacer wires are in place. Six intact sternotomy wires.  Cardiomediastinal silhouette is accentuated by AP semi upright technique and postoperative state. Lungs are very low in volume with bibasilar atelectasis. No definite pneumothorax. Possible small left pleural effusion.  IMPRESSION: Interval median sternotomy with low lung volumes and mild bibasilar atelectasis, left greater than right. Possible small left pleural effusion.   Electronically Signed   By: Lorin Picket M.D.   On: 09/17/2013 13:48   . [START ON  09/18/2013] acetaminophen  1,000 mg Oral Q6H   Or  . [START ON 09/18/2013] acetaminophen (TYLENOL) oral liquid 160 mg/5 mL  1,000 mg Per Tube Q6H  . allopurinol  300 mg Oral Daily  . [START ON 09/18/2013] aspirin EC  325 mg Oral Daily   Or  . [START ON 09/18/2013] aspirin  324 mg Per Tube Daily  . atorvastatin  10 mg Oral QHS  . [START ON 09/18/2013] bisacodyl  10 mg Oral Daily   Or  . [START ON 09/18/2013] bisacodyl  10 mg Rectal Daily  . cefUROXime (ZINACEF)  IV  1.5 g Intravenous Q12H  . [START ON 09/18/2013] docusate sodium  200 mg Oral Daily  . famotidine (PEPCID) IV  20 mg Intravenous Q12H  . fentaNYL      . fluticasone  2 spray Each Nare QHS  . insulin regular  0-10 Units Intravenous TID WC  . levothyroxine  75 mcg Oral QAC breakfast  . magnesium sulfate      . magnesium sulfate  4 g Intravenous Once  . metoprolol tartrate  12.5 mg Oral BID   Or  . metoprolol tartrate  12.5 mg Per Tube BID  . midazolam      . [START ON 09/19/2013] pantoprazole  40 mg Oral Daily  . potassium chloride  10 mEq Intravenous Q1 Hr x 3  . [START ON 09/18/2013] sodium chloride  3 mL Intravenous Q12H  . vancomycin  1,000 mg Intravenous Once    BMET    Component Value Date/Time   NA 137 09/17/2013 1254   K 4.9 09/17/2013 1254   CL 106 09/17/2013 0430   CO2 23 09/17/2013 0430   GLUCOSE 129* 09/17/2013 1254   BUN 32* 09/17/2013 0430  CREATININE 1.92* 09/17/2013 0430   CALCIUM 9.6 09/17/2013 0430   GFRNONAA 35* 09/17/2013 0430   GFRAA 40* 09/17/2013 0430   CBC    Component Value Date/Time   WBC 6.4 09/17/2013 1330   RBC 4.54 09/17/2013 1330   HGB 14.6 09/17/2013 1330   HCT 42.4 09/17/2013 1330   PLT 96* 09/17/2013 1330   MCV 93.4 09/17/2013 1330   MCH 32.2 09/17/2013 1330   MCHC 34.4 09/17/2013 1330   RDW 13.4 09/17/2013 1330   LYMPHSABS 1.6 09/13/2013 1040   MONOABS 0.8 09/13/2013 1040   EOSABS 0.1 09/13/2013 1040   BASOSABS 0.0 09/13/2013 1040      Assessment/Plan:  1. AKI/CKD- good UOP post-operatively.  ARB on hold.  Cont to follow 2. CAD s/p CABG x 4. Doing well.  Plan per CT surgery 3. VDRF- per primary svc 4. Hyperkalemia- resolved.  Cont to follow 5. Hyperglycemia- per protocol 6. HTN- stable  Briannie Gutierrez A

## 2013-09-17 NOTE — Progress Notes (Signed)
RN drew ABG, ABG w/ acidosis, and pt shivering.  Change back to rest mode per SICU protocol.  Per RN, she will call MD.

## 2013-09-17 NOTE — Progress Notes (Signed)
The patient has had a significant increase in chest tube output from around 50 ml/hr to 440 ml in the past 1.5 hours. Dr. Roxan Hockey was made aware, PTT and CBC were drawn and sent to the lab. Will continue to monitor the patient closely.

## 2013-09-17 NOTE — H&P (View-Only) (Signed)
Reason for Consult: Severe 3 vessel CAD Referring Physician: Dr. Carlean Purl is an 67 y.o. male.  HPI: 67 yo semi-retired male with a history of CAD dating back to 27. He presents with a cc/o chest pain. He had an inferior MI in 1995 and had PTCA and stenting of his RCA at that time.  He had done well since then until recently. About a month ago he had an episode of chest pain while moving his trash can to the street. He says it is hard to describe the nature of the pain but it is severe and is consistent with his pain when he had the MI in 1995.  The evening PTA he once again took his trash cans to the street and had recurrent pain. He had two more episodes during the night that occurred at rest. The next morning he had another severe episode. He took an aspirin and came to the ED.  His initial EKG and enzymes were unremarkable. He was started on a heparin drip. He did rule out for MI by enzymes. An echocardiogram showed an EF of 50-55%. Today he had cardiac catheterization where he was found to have 3 vessel CAD.   He currently is pain free.   Past Medical History  Diagnosis Date  . Adenomatous colon polyp   . Old inferior wall myocardial infarction   . Sleep apnea   . Hypothyroidism   . Hyperlipidemia   . Gout   . Hypertensive heart disease   . CAD     Prior inferior MI treated with stent 1995    . GERD 02/24/2008  . 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 History of kidney stones   . Obesity (BMI 30-39.9)     Past Surgical History  Procedure Laterality Date  . Coronary angioplasty with stent placement  1996  . Coronary angioplasty  1995  . Colonoscopy      Family History: Father- CVA age 24  Social History:  reports that he has quit smoking. He has never used smokeless tobacco. He reports that he does not drink alcohol or use illicit drugs. Smoked in his 52s  Allergies:  Allergies  Allergen Reactions  . Ace Inhibitors Cough     Medications:  Prior to Admission:  Prescriptions prior to admission  Medication Sig Dispense Refill  . allopurinol (ZYLOPRIM) 300 MG tablet Take 300 mg by mouth daily.        [name] ALPRAZolam (XANAX) 0.5 MG tablet Take 0.5 mg by mouth at bedtime as needed for sleep.      [name] amLODipine (NORVASC) 5 MG tablet Take 5 mg by mouth daily.        [name] aspirin EC 81 MG tablet Take 81 mg by mouth daily.      [name] atorvastatin (LIPITOR) 10 MG tablet Take 10 mg by mouth at bedtime.      . famotidine (PEPCID AC) 10 MG chewable tablet Chew 10 mg by mouth daily.      . fluticasone (FLONASE) 50 MCG/ACT nasal spray Place 2 sprays into both nostrils at bedtime.      [name] glucosamine-chondroitin 500-400 MG tablet Take 1 tablet by mouth 2 (two) times daily.      [name] HYDROcodone-acetaminophen (NORCO/VICODIN) 5-325 MG per tablet Take 1 tablet by mouth every 6 (six) hours as needed for moderate pain.      [name] levothyroxine (SYNTHROID, LEVOTHROID) 75 MCG tablet Take 75 mcg by mouth daily.        [name]  losartan (COZAAR) 50 MG tablet Take 1 tablet by mouth daily.       . metoprolol (LOPRESSOR) 50 MG tablet Take 50 mg by mouth 2 (two) times daily.      . Multiple Vitamins-Minerals (MULTIVITAMIN WITH MINERALS) tablet Take 1 tablet by mouth daily.        [name] zolpidem (AMBIEN) 10 MG tablet Take 10 mg by mouth at bedtime.         Results for orders placed during the hospital encounter of 09/13/13 (from the past 48 hour(s))  CBC WITH DIFFERENTIAL     Status: None   Collection Time    09/13/13 10:40 AM      Result Value Range   WBC 6.6  4.0 - 10.5 K/uL   RBC 5.09  4.22 - 5.81 MIL/uL   Hemoglobin 16.5  13.0 - 17.0 g/dL   HCT 48.4  39.0 - 52.0 %   MCV 95.1  78.0 - 100.0 fL   MCH 32.4  26.0 - 34.0 pg   MCHC 34.1  30.0 - 36.0 g/dL   RDW 13.5  11.5 - 15.5 %   Platelets 178  150 - 400 K/uL   Neutrophils Relative % 62  43 - 77 %   Neutro Abs 4.1  1.7 - 7.7 K/uL   Lymphocytes Relative 24  12 - 46 %   Lymphs Abs 1.6  0.7 - 4.0 K/uL    Monocytes Relative 12  3 - 12 %   Monocytes Absolute 0.8  0.1 - 1.0 K/uL   Eosinophils Relative 2  0 - 5 %   Eosinophils Absolute 0.1  0.0 - 0.7 K/uL   Basophils Relative 0  0 - 1 %   Basophils Absolute 0.0  0.0 - 0.1 K/uL  COMPREHENSIVE METABOLIC PANEL     Status: Abnormal   Collection Time    09/13/13 10:40 AM      Result Value Range   Sodium 139  135 - 145 mEq/L   Potassium 4.1  3.5 - 5.1 mEq/L   Chloride 105  96 - 112 mEq/L   CO2 22  19 - 32 mEq/L   Glucose, Bld 98  70 - 99 mg/dL   BUN 30 (*) 6 - 23 mg/dL   Creatinine, Ser 1.93 (*) 0.50 - 1.35 mg/dL   Calcium 9.6  8.4 - 10.5 mg/dL   Total Protein 7.6  6.0 - 8.3 g/dL   Albumin 4.1  3.5 - 5.2 g/dL   AST 20  0 - 37 U/L   ALT 23  0 - 53 U/L   Alkaline Phosphatase 57  39 - 117 U/L   Total Bilirubin 0.4  0.3 - 1.2 mg/dL   GFR calc non Af Amer 35 (*) >90 mL/min   GFR calc Af Amer 40 (*) >90 mL/min   Comment: (NOTE)     The eGFR has been calculated using the CKD EPI equation.     This calculation has not been validated in all clinical situations.     eGFR's persistently <90 mL/min signify possible Chronic Kidney     Disease.  TROPONIN I     Status: None   Collection Time    09/13/13 10:40 AM      Result Value Range   Troponin I <0.30  <0.30 ng/mL   Comment:            Due to the release kinetics of cTnI,     a negative result within the first hours  of the onset of symptoms does not rule out     myocardial infarction with certainty.     If myocardial infarction is still suspected,     repeat the test at appropriate intervals.  APTT     Status: None   Collection Time    09/13/13 10:40 AM      Result Value Range   aPTT 35  24 - 37 seconds  PROTIME-INR     Status: None   Collection Time    09/13/13 10:40 AM      Result Value Range   Prothrombin Time 13.1  11.6 - 15.2 seconds   INR 1.01  0.00 - 1.49  URINALYSIS, ROUTINE W REFLEX MICROSCOPIC     Status: Abnormal   Collection Time    09/13/13 11:42 AM      Result  Value Range   Color, Urine YELLOW  YELLOW   APPearance CLEAR  CLEAR   Specific Gravity, Urine 1.006  1.005 - 1.030   pH 5.5  5.0 - 8.0   Glucose, UA NEGATIVE  NEGATIVE mg/dL   Hgb urine dipstick SMALL (*) NEGATIVE   Bilirubin Urine NEGATIVE  NEGATIVE   Ketones, ur NEGATIVE  NEGATIVE mg/dL   Protein, ur 100 (*) NEGATIVE mg/dL   Urobilinogen, UA 0.2  0.0 - 1.0 mg/dL   Nitrite NEGATIVE  NEGATIVE   Leukocytes, UA NEGATIVE  NEGATIVE  URINE MICROSCOPIC-ADD ON     Status: None   Collection Time    09/13/13 11:42 AM      Result Value Range   RBC / HPF 0-2  <3 RBC/hpf  PROTIME-INR     Status: None   Collection Time    09/13/13 12:52 PM      Result Value Range   Prothrombin Time 13.3  11.6 - 15.2 seconds   INR 1.03  0.00 - 1.49  APTT     Status: Abnormal   Collection Time    09/13/13 12:52 PM      Result Value Range   aPTT >200 (*) 24 - 37 seconds   Comment:            IF BASELINE aPTT IS ELEVATED,     SUGGEST PATIENT RISK ASSESSMENT     BE USED TO DETERMINE APPROPRIATE     ANTICOAGULANT THERAPY.     REPEATED TO VERIFY     CRITICAL RESULT CALLED TO, READ BACK BY AND VERIFIED WITH:     Jaymes Graff (RN) [id number] 09/13/2013 L. LOMAX  TSH     Status: None   Collection Time    09/13/13 12:52 PM      Result Value Range   TSH 3.053  0.350 - 4.500 uIU/mL   Comment: Performed at [facility]     Status: Abnormal   Collection Time    09/13/13 12:52 PM      Result Value Range   Sodium 141  135 - 145 mEq/L   Potassium 4.4  3.5 - 5.1 mEq/L   Chloride 105  96 - 112 mEq/L   CO2 24  19 - 32 mEq/L   Glucose, Bld 92  70 - 99 mg/dL   BUN 29 (*) 6 - 23 mg/dL   Creatinine, Ser 1.93 (*) 0.50 - 1.35 mg/dL   Calcium 9.4  8.4 - 10.5 mg/dL   Total Protein 7.6  6.0 - 8.3 g/dL   Albumin 4.2  3.5 - 5.2 g/dL   AST 21  0 -  37 U/L   ALT 20  0 - 53 U/L   Alkaline Phosphatase 57  39 - 117 U/L   Total Bilirubin 0.4  0.3 - 1.2 mg/dL   GFR calc non Af Amer 35 (*) >90  mL/min   GFR calc Af Amer 40 (*) >90 mL/min   Comment: (NOTE)     The eGFR has been calculated using the CKD EPI equation.     This calculation has not been validated in all clinical situations.     eGFR's persistently <90 mL/min signify possible Chronic Kidney     Disease.  TROPONIN I     Status: None   Collection Time    09/13/13 12:54 PM      Result Value Range   Troponin I <0.30  <0.30 ng/mL   Comment:            Due to the release kinetics of cTnI,     a negative result within the first hours     of the onset of symptoms does not rule out     myocardial infarction with certainty.     If myocardial infarction is still suspected,     repeat the test at appropriate intervals.  HEPARIN LEVEL (UNFRACTIONATED)     Status: None   Collection Time    09/13/13  6:00 PM      Result Value Range   Heparin Unfractionated 0.50  0.30 - 0.70 IU/mL   Comment:            IF HEPARIN RESULTS ARE BELOW     EXPECTED VALUES, AND PATIENT     DOSAGE HAS BEEN CONFIRMED,     SUGGEST FOLLOW UP TESTING     OF ANTITHROMBIN III LEVELS.  TROPONIN I     Status: None   Collection Time    09/13/13  6:00 PM      Result Value Range   Troponin I <0.30  <0.30 ng/mL   Comment:            Due to the release kinetics of cTnI,     a negative result within the first hours     of the onset of symptoms does not rule out     myocardial infarction with certainty.     If myocardial infarction is still suspected,     repeat the test at appropriate intervals.  TROPONIN I     Status: None   Collection Time    09/14/13 12:20 AM      Result Value Range   Troponin I <0.30  <0.30 ng/mL   Comment:            Due to the release kinetics of cTnI,     a negative result within the first hours     of the onset of symptoms does not rule out     myocardial infarction with certainty.     If myocardial infarction is still suspected,     repeat the test at appropriate intervals.  HEPARIN LEVEL (UNFRACTIONATED)     Status:  None   Collection Time    09/14/13  6:50 AM      Result Value Range   Heparin Unfractionated 0.70  0.30 - 0.70 IU/mL   Comment:            IF HEPARIN RESULTS ARE BELOW     EXPECTED VALUES, AND PATIENT     DOSAGE HAS BEEN CONFIRMED,     SUGGEST FOLLOW UP TESTING  OF ANTITHROMBIN III LEVELS.  CBC     Status: None   Collection Time    09/14/13  6:50 AM      Result Value Range   WBC 6.0  4.0 - 10.5 K/uL   RBC 5.03  4.22 - 5.81 MIL/uL   Hemoglobin 15.8  13.0 - 17.0 g/dL   HCT 47.3  39.0 - 52.0 %   MCV 94.0  78.0 - 100.0 fL   MCH 31.4  26.0 - 34.0 pg   MCHC 33.4  30.0 - 36.0 g/dL   RDW 13.6  11.5 - 15.5 %   Platelets 176  150 - 400 K/uL  BASIC METABOLIC PANEL     Status: Abnormal   Collection Time    09/14/13  6:50 AM      Result Value Range   Sodium 143  135 - 145 mEq/L   Potassium 4.0  3.5 - 5.1 mEq/L   Chloride 109  96 - 112 mEq/L   CO2 24  19 - 32 mEq/L   Glucose, Bld 105 (*) 70 - 99 mg/dL   BUN 28 (*) 6 - 23 mg/dL   Creatinine, Ser 1.91 (*) 0.50 - 1.35 mg/dL   Calcium 9.0  8.4 - 10.5 mg/dL   GFR calc non Af Amer 35 (*) >90 mL/min   GFR calc Af Amer 40 (*) >90 mL/min   Comment: (NOTE)     The eGFR has been calculated using the CKD EPI equation.     This calculation has not been validated in all clinical situations.     eGFR's persistently <90 mL/min signify possible Chronic Kidney     Disease.    Dg Chest 2 View  09/13/2013   CLINICAL DATA:  Mid left-sided chest pain with tingling down arm.  EXAM: CHEST  2 VIEW  COMPARISON:  03/03/2010  FINDINGS: Cardiac silhouette is upper limits of normal in size, unchanged. Thoracic aorta remains moderately tortuous. The lungs are well inflated and clear. There is no evidence of pleural effusion or pneumothorax. Multilevel osteophytosis is noted in the thoracic spine.  IMPRESSION: No evidence of active cardiopulmonary disease.   Electronically Signed   By: Logan Bores   On: 09/13/2013 11:07    Review of Systems  Constitutional:  Negative for fever and chills.       Obese  Eyes: Positive for blurred vision (chronic).  Cardiovascular: Positive for chest pain.  Genitourinary:       Nocturia  Musculoskeletal: Positive for joint pain.  All other systems reviewed and are negative.   Blood pressure 127/79, pulse 57, temperature 98.2 F (36.8 C), temperature source Oral, resp. rate 18, height 5\' 9"  (1.753 m), weight 247 lb (112.038 kg), SpO2 95.00%. Physical Exam  Vitals reviewed. Constitutional: He is oriented to person, place, and time. No distress.  obese  HENT:  Head: Normocephalic and atraumatic.  Eyes: EOM are normal. Pupils are equal, round, and reactive to light.  Neck: Neck supple.  No carotid bruits  Cardiovascular: Normal rate, normal heart sounds and intact distal pulses.  Exam reveals no gallop and no friction rub.   No murmur heard. Respiratory: Effort normal. He has no wheezes. He has no rales.  GI: Soft. There is no tenderness.  Musculoskeletal: He exhibits no edema.  Lymphadenopathy:    He has no cervical adenopathy.  Neurological: He is alert and oriented to person, place, and time. No cranial nerve deficit.  No focal motor deficit  Skin: Skin is warm and  dry.   CARDIAC CATHETERIZATION  IMPRESSIONS:  1. 3 vessel coronary artery disease in a patient with unstable angina with occlusion of the right coronary artery, severe stenosis in the marginal branch and moderately severe LAD stenosis 2. Normal left ventricle by echocardiogram (50-55%)   ECHOCARDIOGRAM  Study Conclusions  - Left ventricle: The cavity size was normal. Wall thickness was increased in a pattern of mild LVH. Systolic function was normal. The estimated ejection fraction was in the range of 50% to 55%. Wall motion was normal; there were no regional wall motion abnormalities. Doppler parameters are consistent with abnormal left ventricular relaxation (grade 1 diastolic dysfunction). - Left atrium: The atrium was mildly  dilated.   Assessment/Plan: 67 yo man with multiple CRF and a history of CAD presents with unstable angina. He has severe 3 vessel CAD and only mild LV dysfunction(EF 50%). CABG is indicated for survival benefit and relief of symptoms.  I discussed the indications, risks, benefits and alternative treatments with Mr. And Mrs. Handler. They understand the general nature of the procedure, the need for general anesthesia, and the incisions to be used. We discussed the expected hospital stay, overall recovery and short and long term outcomes. They understand the risks include, but are not limited to death, stroke, MI, DVT/PE, bleeding, possible need for transfusion, infections, cardiac arrhythmias, and other organ system dysfunction including respiratory, renal, or GI complications. They understand he is at increased risk for renal complications given his CKD. They accept the risks and agree to proceed.  For CABG Monday December 15  Sahily Biddle C 09/14/2013, 2:15 PM

## 2013-09-17 NOTE — Transfer of Care (Signed)
Immediate Anesthesia Transfer of Care Note  Patient: Dalton Mitchell  Procedure(s) Performed: Procedure(s): CORONARY ARTERY BYPASS GRAFTING (CABG) (N/A)  Patient Location: SICU  Anesthesia Type:General  Level of Consciousness: Patient remains intubated per anesthesia plan  Airway & Oxygen Therapy: Patient remains intubated per anesthesia plan  Post-op Assessment: Post -op Vital signs reviewed and stable  Post vital signs: stable  Complications: No apparent anesthesia complications

## 2013-09-17 NOTE — Progress Notes (Signed)
Attempting vent wean again, per RN- pt rec bicarb post ABG

## 2013-09-18 ENCOUNTER — Encounter (HOSPITAL_COMMUNITY): Payer: Self-pay | Admitting: Thoracic Surgery (Cardiothoracic Vascular Surgery)

## 2013-09-18 ENCOUNTER — Inpatient Hospital Stay (HOSPITAL_COMMUNITY): Payer: Medicare Other

## 2013-09-18 LAB — POCT I-STAT 3, ART BLOOD GAS (G3+)
Acid-base deficit: 1 mmol/L (ref 0.0–2.0)
Acid-base deficit: 2 mmol/L (ref 0.0–2.0)
Bicarbonate: 25.6 mEq/L — ABNORMAL HIGH (ref 20.0–24.0)
Bicarbonate: 24.5 mEq/L — ABNORMAL HIGH (ref 20.0–24.0)
Bicarbonate: 23.6 mEq/L (ref 20.0–24.0)
O2 Saturation: 96 %
O2 Saturation: 93 %
pH, Arterial: 7.369 (ref 7.350–7.450)
pO2, Arterial: 88 mmHg (ref 80.0–100.0)
pO2, Arterial: 81 mmHg (ref 80.0–100.0)

## 2013-09-18 LAB — CBC
HCT: 30.8 % — ABNORMAL LOW (ref 39.0–52.0)
HCT: 33.2 % — ABNORMAL LOW (ref 39.0–52.0)
Hemoglobin: 10.7 g/dL — ABNORMAL LOW (ref 13.0–17.0)
Hemoglobin: 11.2 g/dL — ABNORMAL LOW (ref 13.0–17.0)
MCH: 32 pg (ref 26.0–34.0)
MCH: 32 pg (ref 26.0–34.0)
MCHC: 33.7 g/dL (ref 30.0–36.0)
MCHC: 33.7 g/dL (ref 30.0–36.0)
MCV: 94.9 fL (ref 78.0–100.0)
MCV: 93.9 fL (ref 78.0–100.0)
Platelets: 102 10*3/uL — ABNORMAL LOW (ref 150–400)
RBC: 3.5 MIL/uL — ABNORMAL LOW (ref 4.22–5.81)
RDW: 13.6 % (ref 11.5–15.5)
RDW: 13.6 % (ref 11.5–15.5)
RDW: 13.7 % (ref 11.5–15.5)
WBC: 13.7 10*3/uL — ABNORMAL HIGH (ref 4.0–10.5)
WBC: 14.5 10*3/uL — ABNORMAL HIGH (ref 4.0–10.5)

## 2013-09-18 LAB — POCT I-STAT, CHEM 8
BUN: 25 mg/dL — ABNORMAL HIGH (ref 6–23)
Calcium, Ion: 1.33 mmol/L — ABNORMAL HIGH (ref 1.13–1.30)
Chloride: 103 mEq/L (ref 96–112)
Creatinine, Ser: 1.9 mg/dL — ABNORMAL HIGH (ref 0.50–1.35)
HCT: 40 % (ref 39.0–52.0)
Hemoglobin: 13.6 g/dL (ref 13.0–17.0)
Sodium: 141 mEq/L (ref 135–145)
TCO2: 25 mmol/L (ref 0–100)

## 2013-09-18 LAB — GLUCOSE, CAPILLARY
Glucose-Capillary: 111 mg/dL — ABNORMAL HIGH (ref 70–99)
Glucose-Capillary: 115 mg/dL — ABNORMAL HIGH (ref 70–99)
Glucose-Capillary: 120 mg/dL — ABNORMAL HIGH (ref 70–99)
Glucose-Capillary: 121 mg/dL — ABNORMAL HIGH (ref 70–99)
Glucose-Capillary: 121 mg/dL — ABNORMAL HIGH (ref 70–99)
Glucose-Capillary: 121 mg/dL — ABNORMAL HIGH (ref 70–99)
Glucose-Capillary: 121 mg/dL — ABNORMAL HIGH (ref 70–99)
Glucose-Capillary: 122 mg/dL — ABNORMAL HIGH (ref 70–99)
Glucose-Capillary: 123 mg/dL — ABNORMAL HIGH (ref 70–99)
Glucose-Capillary: 123 mg/dL — ABNORMAL HIGH (ref 70–99)
Glucose-Capillary: 124 mg/dL — ABNORMAL HIGH (ref 70–99)
Glucose-Capillary: 125 mg/dL — ABNORMAL HIGH (ref 70–99)
Glucose-Capillary: 139 mg/dL — ABNORMAL HIGH (ref 70–99)
Glucose-Capillary: 84 mg/dL (ref 70–99)

## 2013-09-18 LAB — BASIC METABOLIC PANEL
Calcium: 8.5 mg/dL (ref 8.4–10.5)
GFR calc Af Amer: 49 mL/min — ABNORMAL LOW (ref >90)
GFR calc non Af Amer: 42 mL/min — ABNORMAL LOW (ref >90)
Sodium: 139 mEq/L (ref 135–145)

## 2013-09-18 LAB — POCT I-STAT 4, (NA,K, GLUC, HGB,HCT)
Glucose, Bld: 140 mg/dL — ABNORMAL HIGH (ref 70–99)
Hemoglobin: 11.9 g/dL — ABNORMAL LOW (ref 13.0–17.0)
Potassium: 4 mEq/L (ref 3.5–5.1)

## 2013-09-18 LAB — MAGNESIUM
Magnesium: 2.3 mg/dL (ref 1.5–2.5)
Magnesium: 2.1 mg/dL (ref 1.5–2.5)

## 2013-09-18 LAB — CREATININE, SERUM: GFR calc Af Amer: 42 mL/min — ABNORMAL LOW (ref >90)

## 2013-09-18 LAB — PROTIME-INR
INR: 1.43 (ref 0.00–1.49)
Prothrombin Time: 17.1 seconds — ABNORMAL HIGH (ref 11.6–15.2)

## 2013-09-18 MED ORDER — INSULIN DETEMIR 100 UNIT/ML ~~LOC~~ SOLN
25.0000 [IU] | Freq: Once | SUBCUTANEOUS | Status: AC
  Administered 2013-09-18: 25 [IU] via SUBCUTANEOUS
  Filled 2013-09-18: qty 0.25

## 2013-09-18 MED ORDER — ALBUMIN HUMAN 5 % IV SOLN
INTRAVENOUS | Status: AC
  Administered 2013-09-18: 12.5 g
  Filled 2013-09-18: qty 250

## 2013-09-18 MED ORDER — INSULIN ASPART 100 UNIT/ML ~~LOC~~ SOLN
0.0000 [IU] | SUBCUTANEOUS | Status: DC
  Administered 2013-09-18 (×3): 2 [IU] via SUBCUTANEOUS

## 2013-09-18 MED ORDER — DOPAMINE-DEXTROSE 3.2-5 MG/ML-% IV SOLN
0.0000 ug/kg/min | INTRAVENOUS | Status: DC
  Administered 2013-09-19: 3 ug/kg/min via INTRAVENOUS
  Filled 2013-09-18: qty 250

## 2013-09-18 MED ORDER — LEVALBUTEROL HCL 0.63 MG/3ML IN NEBU: 0.6300 mg | INHALATION_SOLUTION | RESPIRATORY_TRACT | Status: DC | PRN

## 2013-09-18 MED ORDER — INSULIN DETEMIR 100 UNIT/ML ~~LOC~~ SOLN
25.0000 [IU] | Freq: Every day | SUBCUTANEOUS | Status: DC
  Filled 2013-09-18: qty 0.25

## 2013-09-18 MED ORDER — FUROSEMIDE 10 MG/ML IJ SOLN
40.0000 mg | Freq: Once | INTRAMUSCULAR | Status: AC
  Administered 2013-09-18: 40 mg via INTRAVENOUS

## 2013-09-18 MED ORDER — ALBUMIN HUMAN 5 % IV SOLN: 12.5000 g | Freq: Once | INTRAVENOUS | Status: AC

## 2013-09-18 MED ORDER — ALBUMIN HUMAN 5 % IV SOLN
12.5000 g | Freq: Once | INTRAVENOUS | Status: AC
  Administered 2013-09-18: 12.5 g via INTRAVENOUS
  Filled 2013-09-18: qty 250

## 2013-09-18 MED FILL — Dexmedetomidine HCl IV Soln 200 MCG/2ML: INTRAVENOUS | Qty: 2 | Status: AC

## 2013-09-18 MED FILL — Magnesium Sulfate Inj 50%: INTRAMUSCULAR | Qty: 10 | Status: AC

## 2013-09-18 MED FILL — Sodium Chloride IV Soln 0.9%: INTRAVENOUS | Qty: 1000 | Status: AC

## 2013-09-18 MED FILL — Heparin Sodium (Porcine) Inj 1000 Unit/ML: INTRAMUSCULAR | Qty: 30 | Status: AC

## 2013-09-18 MED FILL — Potassium Chloride Inj 2 mEq/ML: INTRAVENOUS | Qty: 40 | Status: AC

## 2013-09-18 MED FILL — Potassium Chloride Inj 2 mEq/ML: INTRAVENOUS | Qty: 20 | Status: AC

## 2013-09-18 NOTE — Progress Notes (Signed)
Subjective:  C/o soreness.  Mild pleuritic chest pain.  Not SOB.   Objective:  Vital Signs in the last 24 hours: BP 102/59  Pulse 90  Temp(Src) 99.1 F (37.3 C) (Oral)  Resp 17  Ht 5\' 9"  (1.753 m)  Wt 117.572 kg (259 lb 3.2 oz)  BMI 38.26 kg/m2  SpO2 97%  Physical Exam: Pale WM in NAD Lungs:  Reduced BS Cardiac:  Regular rhythm, normal S1 and S2, no S3 Abdomen:  Soft, nontender, no masses Extremities:  2+ edema present  Intake/Output from previous day: 12/15 0701 - 12/16 0700 In: 8593.5 [I.V.:5108.5; Blood:1335; IV [id number] Out: [id number]; Emesis/NG output:20; Blood:1625; Chest [id number] Weight Filed Weights   09/13/13 1432 09/15/13 2055 09/18/13 0500  Weight: 112.038 kg (247 lb) 111.131 kg (245 lb) 117.572 kg (259 lb 3.2 oz)    Lab Results: Basic Metabolic Panel:  Recent Labs  09/17/13 0430  09/17/13 1747 09/17/13 1750  09/17/13 2345 09/18/13 0400  NA 141  < > 140  --   < > 140 139  K 4.0  < > 4.3  --   < > 4.0 4.6  CL 106  --  106  --   --   --  108  CO2 23  --   --   --   --   --  25  GLUCOSE 126*  < > 134*  --   < > 125* 120*  BUN 32*  --  28*  --   --   --  24*  CREATININE 1.92*  --  1.70* 1.73*  --   --  1.63*  < > = values in this interval not displayed.  CBC:  Recent Labs  09/17/13 2340 09/17/13 2345 09/18/13 0400  WBC 13.7*  --  13.5*  HGB 11.2* 11.2* 11.2*  HCT 33.2* 33.0* 33.2*  MCV 94.9  --  94.9  PLT 100*  --  102*   Telemetry: Atrial paced rhythm  Assessment/Plan:  1. Stable post recent CABG 2. Renal function stable with good output 3. Post op volume excess getting diuresed     W. Doristine Church  MD [facility]  09/18/2013, 1:55 PM

## 2013-09-18 NOTE — Progress Notes (Signed)
Weaning vent per SICU wean protocol. RN aware.

## 2013-09-18 NOTE — Progress Notes (Signed)
1 Day Post-Op Procedure(s) (LRB): EXPLORATION POST OPERATIVE OPEN HEART (N/A) Subjective: Intubated Alert Some pain  Objective: Vital signs in last 24 hours: Temp:  [95.7 F (35.4 C)-101.3 F (38.5 C)] 100.6 F (38.1 C) (12/16 0719) Pulse Rate:  [89-92] 90 (12/16 0719) Cardiac Rhythm:  [-] Atrial paced (12/16 0700) Resp:  [0-24] 19 (12/16 0719) BP: (79-109)/(52-71) 103/52 mmHg (12/16 0719) SpO2:  [91 %-100 %] 99 % (12/16 0719) Arterial Line BP: (85-135)/(45-80) 86/45 mmHg (12/16 0645) FiO2 (%):  [40 %-60 %] 40 % (12/16 0719) Weight:  [259 lb 3.2 oz (117.572 kg)] 259 lb 3.2 oz (117.572 kg) (12/16 0500)  Hemodynamic parameters for last 24 hours: PAP: (19-39)/(6-29) 33/20 mmHg CO:  [3.9 L/min-5.6 L/min] 4.9 L/min CI:  [1.7 L/min/m2-2.4 L/min/m2] 2.1 L/min/m2  Intake/Output from previous day: 12/15 0701 - 12/16 0700 In: 8593.5 [I.V.:5108.5; Blood:1335; IV [id number] Out: [id number]; Emesis/NG output:20; Blood:1625; Chest [id number] Intake/Output this shift:    General appearance: alert and no distress Neurologic: intact Heart: regular rate and rhythm Lungs: diminished breath sounds bibasilar Abdomen: normal findings: soft, non-tender  Lab Results:  Recent Labs  09/17/13 2340 09/17/13 2345 09/18/13 0400  WBC 13.7*  --  13.5*  HGB 11.2* 11.2* 11.2*  HCT 33.2* 33.0* 33.2*  PLT 100*  --  102*   BMET:  Recent Labs  09/17/13 0430  09/17/13 1747 09/17/13 1750 09/17/13 2345 09/18/13 0400  NA 141  < > 140  --  140 139  K 4.0  < > 4.3  --  4.0 4.6  CL 106  --  106  --   --  108  CO2 23  --   --   --   --  25  GLUCOSE 126*  < > 134*  --  125* 120*  BUN 32*  --  28*  --   --  24*  CREATININE 1.92*  --  1.70* 1.73*  --  1.63*  CALCIUM 9.6  --   --   --   --  8.5  < > = values in this interval not displayed.  PT/INR:  Recent Labs  09/17/13 2340  LABPROT 17.1*  INR 1.43   ABG    Component Value Date/Time   PHART 7.358 09/17/2013 2341   HCO3 23.5  09/17/2013 2341   TCO2 25 09/17/2013 2341   ACIDBASEDEF 2.0 09/17/2013 2341   O2SAT 97.0 09/17/2013 2341   CBG (last 3)   Recent Labs  09/18/13 0210 09/18/13 0315 09/18/13 0409  GLUCAP 116* 123* 121*    Assessment/Plan: S/P Procedure(s) (LRB): EXPLORATION POST OPERATIVE OPEN HEART (N/A) POD # 1 CABG/ Re-exploration for bleeding CV- stable, good cardiac output, SR, A paced for rate  RESP- ABG OK- extubate  RENAL- CKD, creatinine below baseline, good UO  Diurese  CBG well controlled  Anemia- secondary to ABL- has not been transfused, follow  Thrombocytopenia- stable-   SCD for DVt prophylaxis- no enoxaparin given thrombocytopenia/ bleeding last PM   LOS: 5 days    Delphine Sizemore C 09/18/2013

## 2013-09-18 NOTE — Procedures (Signed)
Extubation Procedure Note  Patient Details:   Name: CHASETON PAULIN DOB: Oct 12, 1945 MRN: [id number]   Airway Documentation:    NIF -40, VC 800 ml, per Dr. Roxan Hockey at bedside- extubate pt now.  Evaluation  O2 sats: stable throughout Complications: No apparent complications Patient did tolerate procedure well. Bilateral Breath Sounds: Clear;Diminished Suctioning: Airway Yes pt able to speak.  No stridor noted, no distress noted.   Lenna Sciara 09/18/2013, 8:26 AM

## 2013-09-18 NOTE — Progress Notes (Signed)
Patient ID: Dalton Mitchell, male   DOB: [birth year], 67 y.o.   MRN: [id number] S:extubated and has done well post op.  Only c/o chest tenderness. O:BP 87/58  Pulse 89  Temp(Src) 100.6 F (38.1 C) (Core (Comment))  Resp 18  Ht 5\' 9"  (1.753 m)  Wt 117.572 kg (259 lb 3.2 oz)  BMI 38.26 kg/m2  SpO2 97%  Intake/Output Summary (Last 24 hours) at 09/18/13 1132 Last data filed at 09/18/13 1030  Gross per 24 hour  Intake 8169.43 ml  Output   7613 ml  Net 556.43 ml   Intake/Output: I/O last 3 completed shifts: In: 8593.5 [I.V.:5108.5; Blood:1335; IV [id number] Out: [id number]; Emesis/NG output:20; Blood:1625; Chest [id number]  Intake/Output this shift:  Total I/O In: 576 [I.V.:276; IV Piggyback:300] Out: 795 [Urine:675; Chest Tube:120] Weight change:  Gen:WD WN WM in NAD CVS:RRR Resp:decreased BS at bases, poor inspiratory effort with some splinting [id number], +BS, soft [id number] pretib edema   Recent Labs Lab 09/13/13 1040 09/13/13 1252 09/14/13 0650 09/15/13 0825 09/16/13 0415 09/17/13 0430  09/17/13 0954 09/17/13 1053 09/17/13 1148 09/17/13 1254 09/17/13 1747 09/17/13 1750 09/17/13 2345 09/18/13 0400  NA 139 141 143 140 141 141  < > 137 137 135 137 140  --  140 139  K 4.1 4.4 4.0 4.2 4.4 4.0  < > 4.0 5.1 5.4* 4.9 4.3  --  4.0 4.6  CL 105 105 109 108 108 106  --   --   --   --   --  106  --   --  108  CO2 22 24 24 22 23 23   --   --   --   --   --   --   --   --  25  GLUCOSE 98 92 105* 108* 91 126*  < > 85 121* 127* 129* 134*  --  125* 120*  BUN 30* 29* 28* 25* 31* 32*  --   --   --   --   --  28*  --   --  24*  CREATININE 1.93* 1.93* 1.91* 1.78* 1.94* 1.92*  --   --   --   --   --  1.70* 1.73*  --  1.63*  ALBUMIN 4.1 4.2  --   --   --   --   --   --   --   --   --   --   --   --   --   CALCIUM 9.6 9.4 9.0 9.4 9.5 9.6  --   --   --   --   --   --   --   --  8.5  AST 20 21  --   --   --   --   --   --   --   --   --   --   --   --   --   ALT 23 20  --    --   --   --   --   --   --   --   --   --   --   --   --   < > = values in this interval not displayed. Liver Function Tests:  Recent Labs Lab 09/13/13 1040 09/13/13 1252  AST 20 21  ALT 23 20  ALKPHOS 57 57  BILITOT 0.4 0.4  PROT 7.6 7.6  ALBUMIN 4.1 4.2   No results found for this basename:  LIPASE, AMYLASE,  in the last 168 hours No results found for this basename: AMMONIA,  in the last 168 hours CBC:  Recent Labs Lab 09/13/13 1040  09/17/13 1330  09/17/13 1750 09/17/13 1959 09/17/13 2340 09/17/13 2345 09/18/13 0400  WBC 6.6  < > 6.4  --  15.1* 14.8* 13.7*  --  13.5*  NEUTROABS 4.1  --   --   --   --   --   --   --   --   HGB 16.5  < > 14.6  < > 14.6 12.9* 11.2* 11.2* 11.2*  HCT 48.4  < > 42.4  < > 43.3 38.7* 33.2* 33.0* 33.2*  MCV 95.1  < > 93.4  --  95.0 95.1 94.9  --  94.9  PLT 178  < > 96*  --  120* 113* 100*  --  102*  < > = values in this interval not displayed. Cardiac Enzymes:  Recent Labs Lab 09/13/13 1040 09/13/13 1254 09/13/13 1800 09/14/13 0020  TROPONINI <0.30 <0.30 <0.30 <0.30   CBG:  Recent Labs Lab 09/18/13 0002 09/18/13 0103 09/18/13 0210 09/18/13 0315 09/18/13 0409  GLUCAP 120* 117* 116* 123* 121*    Iron Studies: No results found for this basename: IRON, TIBC, TRANSFERRIN, FERRITIN,  in the last 72 hours Studies/Results: Dg Chest Portable 1 View In Am  09/18/2013   CLINICAL DATA:  Postop CABG  EXAM: PORTABLE CHEST - 1 VIEW  COMPARISON:  09/17/2013  FINDINGS: Low lung volumes. Mild stable medial lung base atelectasis. No evidence of an infiltrate or edema.  No mediastinal widening.  Orogastric tube tip is stable near the GE junction, likely in the proximal stomach.  Endotracheal tube, right internal jugular Swan-Ganz catheter, mediastinal tube and left inferior hemi thorax chest tube are stable.  No pneumothorax on this semi-erect study.  IMPRESSION: 1. No change from the previous day's study. 2. Mild medial lung base atelectasis. No  evidence of an operative complication.   Electronically Signed   By: Lajean Manes M.D.   On: 09/18/2013 08:17   Dg Chest Portable 1 View  09/18/2013   CLINICAL DATA:  Of dome.  Check support apparatus  EXAM: PORTABLE CHEST - 1 VIEW  COMPARISON:  09/17/2013  FINDINGS: Endotracheal tube ends 3 cm above the carina. Short gastric suction tube, side port body 6 cm above the diaphragm, tip likely just at the proximal stomach. Swan-Ganz catheter via right IJ approach, tip near the main pulmonary artery. Thoracic drains in similar position.  Stable heart size and mediastinal contours. Low lung volumes with persistent retrocardiac opacity. No significant pneumothorax.  IMPRESSION: 1. Short orogastric tube, tip at the upper stomach. Suggest advancement by 5 or 6 cm. 2. Other support apparatus similar to earlier the same day. 3. Unchanged lung aeration.   Electronically Signed   By: Jorje Guild M.D.   On: 09/18/2013 00:24   Dg Chest Port 1 View  09/17/2013   CLINICAL DATA:  Blood coming from chest tube.  Bypass surgery.  EXAM: PORTABLE CHEST - 1 VIEW  COMPARISON:  Earlier film, same date.  FINDINGS: The endotracheal tube is 18 mm above the carina. The NG tube is in the stomach. There is a left basilar chest tube which appears stable. There is a persistent small left pleural effusion. No definite pneumothorax. The Swan-Ganz catheter is stable and the mediastinal drain tubes are stable.  IMPRESSION: The endotracheal tube is 18 mm above the carina.  Stable appearance of the  left-sided chest tube. There is a persistent left effusion but no pneumothorax.  Vascular congestion and atelectasis but no overt edema.   Electronically Signed   By: Kalman Jewels M.D.   On: 09/17/2013 20:29   Dg Chest Portable 1 View  09/17/2013   CLINICAL DATA:  Post CABG.  EXAM: PORTABLE CHEST - 1 VIEW  COMPARISON:  09/13/2013.  FINDINGS: Endotracheal tube terminates approximately 2 cm above the carina. Nasogastric tube is followed into  the stomach. Side port appears to be in the region of the gastroesophageal junction. Mediastinal drain and left chest tube are in place. Right IJ Swan-Ganz catheter tip projects over the right pulmonary artery. Epicardial pacer wires are in place. Six intact sternotomy wires.  Cardiomediastinal silhouette is accentuated by AP semi upright technique and postoperative state. Lungs are very low in volume with bibasilar atelectasis. No definite pneumothorax. Possible small left pleural effusion.  IMPRESSION: Interval median sternotomy with low lung volumes and mild bibasilar atelectasis, left greater than right. Possible small left pleural effusion.   Electronically Signed   By: Lorin Picket M.D.   On: 09/17/2013 13:48   . acetaminophen  1,000 mg Oral Q6H   Or  . acetaminophen (TYLENOL) oral liquid 160 mg/5 mL  1,000 mg Per Tube Q6H  . aspirin EC  325 mg Oral Daily   Or  . aspirin  324 mg Per Tube Daily  . bisacodyl  10 mg Oral Daily   Or  . bisacodyl  10 mg Rectal Daily  . cefUROXime (ZINACEF)  IV  1.5 g Intravenous Q12H  . docusate sodium  200 mg Oral Daily  . famotidine (PEPCID) IV  20 mg Intravenous Q12H  . insulin aspart  0-24 Units Subcutaneous Q4H  . insulin detemir  25 Units Subcutaneous Once  . [START ON 09/19/2013] insulin detemir  25 Units Subcutaneous Daily  . insulin regular  0-10 Units Intravenous TID WC  . levalbuterol  0.63 mg Nebulization Q6H  . metoprolol tartrate  12.5 mg Oral BID   Or  . metoprolol tartrate  12.5 mg Per Tube BID  . [START ON 09/19/2013] pantoprazole  40 mg Oral Daily  . sodium chloride  3 mL Intravenous Q12H    BMET    Component Value Date/Time   NA 139 09/18/2013 0400   K 4.6 09/18/2013 0400   CL 108 09/18/2013 0400   CO2 25 09/18/2013 0400   GLUCOSE 120* 09/18/2013 0400   BUN 24* 09/18/2013 0400   CREATININE 1.63* 09/18/2013 0400   CALCIUM 8.5 09/18/2013 0400   GFRNONAA 42* 09/18/2013 0400   GFRAA 49* 09/18/2013 0400   CBC    Component  Value Date/Time   WBC 13.5* 09/18/2013 0400   RBC 3.50* 09/18/2013 0400   HGB 11.2* 09/18/2013 0400   HCT 33.2* 09/18/2013 0400   PLT 102* 09/18/2013 0400   MCV 94.9 09/18/2013 0400   MCH 32.0 09/18/2013 0400   MCHC 33.7 09/18/2013 0400   RDW 13.6 09/18/2013 0400   LYMPHSABS 1.6 09/13/2013 1040   MONOABS 0.8 09/13/2013 1040   EOSABS 0.1 09/13/2013 1040   BASOSABS 0.0 09/13/2013 1040     Assessment/Plan:  1. CKD stage 3 (baseline Scr has been 1.8)- good UOP post-operatively. ARB on hold and Scr below his baseline. Nothing further to add so will sign off but will cont to follow peripherally.  Call with any questions or concerns.  Will set up f/u with Dr. Justin Mend once he is d/c'd. 2. CAD s/p  CABG x 4. Doing well. Plan per CT surgery 3. VDRF- per primary svc 4. Hyperkalemia- resolved. Cont to follow 5. Hyperglycemia- per protocol 6. HTN- stable 7. ABLA- cont to follow. 8. Hypothyroidism- doesn't appear to be on hormone replacement.  With check TSH  Marlowe Lawes A

## 2013-09-18 NOTE — Progress Notes (Signed)
PHARMACIST  COMMUNICATION  CONCERNING:  Zinacef  DESCRIPTION: Patient continues to be febrile,  Zinacef to continue past 48 hours of AET time  Thank you. Anette Guarneri, PharmD [phone]

## 2013-09-18 NOTE — Op Note (Signed)
NAMEMarland Mitchell  GEOFF, WARLEY NO.:  [id number]  MEDICAL RECORD NO.:  [id number]  LOCATION:  2S14C                        FACILITY:  [facility]  PHYSICIAN:  Revonda Standard. Roxan Hockey, M.D.DATE OF BIRTH:  02/17/1946  DATE OF PROCEDURE:  09/17/2013 DATE OF DISCHARGE:                              OPERATIVE REPORT   PREOPERATIVE DIAGNOSIS:  Severe 3-vessel coronary artery disease with unstable angina.  POSTOPERATIVE DIAGNOSIS:  Severe 3-vessel coronary artery disease with unstable angina.  PROCEDURE:  Median sternotomy, extracorporeal circulation, coronary artery bypass grafting x4 (left internal mammary artery to left anterior descending, saphenous vein graft to obtuse marginal 2, sequential saphenous vein graft to posterior descending and distal right coronary).  SURGEON:  Revonda Standard. Roxan Hockey, M.D.  ASSISTANT:  Suzzanne Cloud, P.A.  ANESTHESIA:  General.  FINDINGS:  OM-2 and LAD large caliber good quality targets. Posterior descending and distal right coronary smaller caliber but still good quality targets at the site of the anastomosis. Good quality conduits.  CLINICAL NOTE:  Mr. Dalton Mitchell is a 67 year old gentleman with a known history of coronary artery disease who presented with unstable angina. At catheterization, he was found to have severe 3-vessel coronary artery disease.  An echocardiogram prior to catheterization had shown an ejection fraction of 50-55%.  Given his severe 3-vessel coronary artery disease and unstable angina, coronary artery bypass grafting was indicated for survival benefit as well as relief of symptoms.  The indications, risks, benefits, and alternatives were discussed in detail with the patient.  He understood and accepted the risks and agreed to proceed.  OPERATIVE NOTE:  Mr. Kubly was brought to the preoperative holding area on September 17, 2013.  There, Anesthesia placed a Swan-Ganz catheter and an arterial blood pressure monitoring  line.  Intravenous antibiotics were administered.  He was taken to the operating room, anesthetized, and intubated.  A Foley catheter was placed.  The chest, abdomen, and legs were prepped and draped in the usual sterile fashion.  An incision was made in the medial aspect of the right leg at the level of the knee.  The greater saphenous vein was harvested from just below the knee to the groin.  It was of good quality.  Simultaneously, a median sternotomy was performed and the left internal mammary artery was harvested using standard technique.  This was difficult due to the patient's body habitus, but the mammary artery also was a good quality vessel.  2000 units of heparin was administered during the vessel harvest.  The remainder of the full heparin dose was given prior to opening the pericardium.  After harvesting the conduits, the pericardium was opened.  The ascending aorta was inspected.  There was no palpable atherosclerotic disease.  The aorta was cannulated via concentric 2-0 Ethibond pledgeted pursestring sutures.  A dual-stage venous cannula was placed via a pursestring suture in the right atrial appendage.  Cardiopulmonary bypass was initiated.  Flows were maintained per protocol.  The patient was cooled to 32 degrees Celsius.  The coronary arteries were inspected and anastomotic sites were chosen.  The conduits were inspected and cut to length.  A foam pad was placed in the pericardium to insulate the heart and protect the left phrenic nerve.  A temperature probe was placed in the myocardial septum and a cardioplegia cannula was placed in the ascending aorta.  The aorta was crossclamped.  The left ventricle was emptied via the aortic root vent.  Cardiac arrest then was achieved with a combination of cold antegrade blood cardioplegia and topical iced saline.  1 L of cardioplegia was administered.  There was a rapid diastolic arrest and myocardial septal cooling to 10  degrees Celsius.  The following distal anastomoses were performed.  First, a reversed saphenous vein graft was placed end-to-side to obtuse marginal 2.  This was the largest lateral wall vessel. It had a 95% stenosis.  Both the vein and the target were good quality. The vessel accepted a 1.5 mm probe without difficulty.  An end-to-side anastomosis was performed with a running 7-0 Prolene suture.  The probe passed easily proximally and distally.  At the completion of anastomosis, cardioplegia was administered down the graft.  There was good flow through the graft and good hemostasis at the anastomosis.  After administering additional cardioplegia down the aortic root, the inferior wall was exposed.  The posterior descending was the only graftable branch vessel, the posterolaterals were too small to graft directly, but the distal right coronary was graftable.  A sequential vein was placed to the posterior descending and distal right coronary. A side-to-side anastomosis was performed to the posterior descending. This was a good quality vessel.  It did accept a 1.5 mm probe, it was of smaller caliber than the OM and LAD.  A side-to-side anastomosis was performed with a running 7-0 Prolene suture.  The probe passed easily in both directions.  After completion of the anastomosis, the distal end of the vein was beveled and anastomosed end-to-side to the distal right coronary.  This was a 1.5 mm vessel. It was good quality at the site of the anastomosis and a probe did pass easily distally.  This anastomosis also was performed with a running 7-0 Prolene suture.  At the completion of the second distal, cardioplegia was administered down the graft.  There was good flow and good hemostasis at both anastomoses. Additional cardioplegia was administered down the aortic root in addition to the vein grafts.    Next, the left internal mammary artery was brought through a window in the pericardium.  It  was beveled distally, and anastomosed end-to-side to the LAD.  The LAD was a 2-mm good quality target vessel at the site of the anastomosis.  A probe did pass retrograde proximally to the level of the takeoff of the diagonal branch.  The mammary was a 2-mm good quality conduit.  An end-to-side anastomosis was performed with a running 8-0 Prolene suture.  At the completion of the mammary to LAD anastomosis, the bulldog clamp was briefly removed to inspect for hemostasis.  Rapid septal rewarming was noted.  The bulldog clamp was replaced and the mammary pedicle was tacked to the epicardial surface of the heart with 6-0 Prolene sutures.  The veins were cut to length.  The cardioplegia cannula was removed from the ascending aorta.  The proximal vein graft anastomoses then were performed to 4.5 mm punch aortotomies with running 6-0 Prolene sutures. The patient was rewarmed during the proximal anastomoses.  At the completion of the final proximal anastomosis, the patient was placed in Trendelenburg position.  Lidocaine was administered.  The bulldog clamp was again removed from the left mammary artery and rapid septal rewarming was again noted.  The aortic root was de-aired and  the aortic crossclamp was removed.  The total crossclamp time was 70 minutes.  The patient required a single defibrillation with 10 joules and then was in sinus rhythm thereafter.  While rewarming was completed, all proximal and distal anastomoses were inspected for hemostasis.  Epicardial pacing wires were placed on the right ventricle and the right atrium.  When the patient had rewarmed to a core temperature of 37 degrees Celsius, he was weaned from cardiopulmonary bypass on the first attempt.  The total bypass time was 108 minutes.  The initial cardiac index was greater than 2 L/min/m2 and the patient remained hemodynamically stable throughout the postbypass period.  A test dose of protamine was administered and  was well tolerated.  The atrial and aortic cannulae were removed.  The remainder of the protamine was administered without incident.  The chest was irrigated with warm saline.  Hemostasis was achieved.  The pericardium was reapproximated with interrupted 3-0 silk sutures.  It came together easily without tension or kinking the underlying grafts.  Chest tubes were placed in the left pleural space and the mediastinum.  The sternum was closed with interrupted heavy gauge stainless steel wires.  The pectoralis fascia, subcutaneous tissue, and skin were closed in standard fashion.  All sponge, needle, and instrument counts were correct at the end of the procedure.  The patient was taken from the operating room to the Surgical Intensive Care Unit intubated and in good condition.     Revonda Standard Roxan Hockey, M.D.     SCH/MEDQ  D:  09/17/2013  T:  09/18/2013  Job:  [id number]

## 2013-09-18 NOTE — Progress Notes (Signed)
Patient ID: Dalton Mitchell, male   DOB: 1945-11-07, 67 y.o.   MRN: [id number]   SICU Evening Rounds:   BP in the 90's tonight after large diuresis today. Will give a unit of albumin  Urine output good  CT output low  CBC    Component Value Date/Time   WBC 14.5* 09/18/2013 1615   RBC 3.28* 09/18/2013 1615   HGB 13.6 09/18/2013 1619   HCT 40.0 09/18/2013 1619   PLT 106* 09/18/2013 1615   MCV 93.9 09/18/2013 1615   MCH 32.6 09/18/2013 1615   MCHC 34.7 09/18/2013 1615   RDW 13.6 09/18/2013 1615   LYMPHSABS 1.6 09/13/2013 1040   MONOABS 0.8 09/13/2013 1040   EOSABS 0.1 09/13/2013 1040   BASOSABS 0.0 09/13/2013 1040     BMET    Component Value Date/Time   NA 141 09/18/2013 1619   K 4.1 09/18/2013 1619   CL 103 09/18/2013 1619   CO2 25 09/18/2013 0400   GLUCOSE 149* 09/18/2013 1619   BUN 25* 09/18/2013 1619   CREATININE 1.90* 09/18/2013 1619   CALCIUM 8.5 09/18/2013 0400   GFRNONAA 36* 09/18/2013 1615   GFRAA 42* 09/18/2013 1615     A/P:  Stable postop course. Continue current plans

## 2013-09-19 ENCOUNTER — Inpatient Hospital Stay (HOSPITAL_COMMUNITY): Payer: Medicare Other

## 2013-09-19 LAB — GLUCOSE, CAPILLARY
Glucose-Capillary: 118 mg/dL — ABNORMAL HIGH (ref 70–99)
Glucose-Capillary: 108 mg/dL — ABNORMAL HIGH (ref 70–99)

## 2013-09-19 LAB — BASIC METABOLIC PANEL
BUN: 30 mg/dL — ABNORMAL HIGH (ref 6–23)
BUN: 33 mg/dL — ABNORMAL HIGH (ref 6–23)
CO2: 27 mEq/L (ref 19–32)
Calcium: 9 mg/dL (ref 8.4–10.5)
Calcium: 8.4 mg/dL (ref 8.4–10.5)
Chloride: 104 mEq/L (ref 96–112)
Chloride: 101 mEq/L (ref 96–112)
Creatinine, Ser: 2.09 mg/dL — ABNORMAL HIGH (ref 0.50–1.35)
GFR calc Af Amer: 35 mL/min — ABNORMAL LOW (ref >90)
GFR calc non Af Amer: 30 mL/min — ABNORMAL LOW (ref >90)
Glucose, Bld: 124 mg/dL — ABNORMAL HIGH (ref 70–99)
Glucose, Bld: 124 mg/dL — ABNORMAL HIGH (ref 70–99)
Potassium: 4.5 mEq/L (ref 3.5–5.1)
Sodium: 136 mEq/L (ref 135–145)

## 2013-09-19 LAB — CBC
MCH: 31.6 pg (ref 26.0–34.0)
MCHC: 33.1 g/dL (ref 30.0–36.0)
Platelets: 107 10*3/uL — ABNORMAL LOW (ref 150–400)
RDW: 13.8 % (ref 11.5–15.5)

## 2013-09-19 MED ORDER — INSULIN ASPART 100 UNIT/ML ~~LOC~~ SOLN
0.0000 [IU] | Freq: Three times a day (TID) | SUBCUTANEOUS | Status: DC
  Administered 2013-09-21: 1 [IU] via SUBCUTANEOUS

## 2013-09-19 MED ORDER — INSULIN DETEMIR 100 UNIT/ML ~~LOC~~ SOLN
15.0000 [IU] | Freq: Every day | SUBCUTANEOUS | Status: DC
  Administered 2013-09-19: 15 [IU] via SUBCUTANEOUS
  Filled 2013-09-19 (×2): qty 0.15

## 2013-09-19 MED ORDER — FUROSEMIDE 10 MG/ML IJ SOLN
20.0000 mg | Freq: Two times a day (BID) | INTRAMUSCULAR | Status: AC
  Administered 2013-09-19 (×2): 20 mg via INTRAVENOUS
  Filled 2013-09-19 (×2): qty 2

## 2013-09-19 MED ORDER — ENOXAPARIN SODIUM 30 MG/0.3ML ~~LOC~~ SOLN
30.0000 mg | SUBCUTANEOUS | Status: DC
  Administered 2013-09-19 – 2013-09-24 (×6): 30 mg via SUBCUTANEOUS
  Filled 2013-09-19 (×7): qty 0.3

## 2013-09-19 MED ORDER — FLUTICASONE PROPIONATE 50 MCG/ACT NA SUSP
2.0000 | Freq: Every day | NASAL | Status: DC
  Administered 2013-09-19 – 2013-09-24 (×6): 2 via NASAL
  Filled 2013-09-19 (×2): qty 16

## 2013-09-19 MED ORDER — LEVOTHYROXINE SODIUM 75 MCG PO TABS
75.0000 ug | ORAL_TABLET | Freq: Every day | ORAL | Status: DC
  Administered 2013-09-20 – 2013-09-25 (×6): 75 ug via ORAL
  Filled 2013-09-19 (×7): qty 1

## 2013-09-19 MED ORDER — ATORVASTATIN CALCIUM 10 MG PO TABS
10.0000 mg | ORAL_TABLET | Freq: Every day | ORAL | Status: DC
  Administered 2013-09-19 – 2013-09-24 (×6): 10 mg via ORAL
  Filled 2013-09-19 (×8): qty 1

## 2013-09-19 MED ORDER — ALPRAZOLAM 0.25 MG PO TABS
0.5000 mg | ORAL_TABLET | Freq: Every evening | ORAL | Status: DC | PRN
  Administered 2013-09-22 – 2013-09-24 (×4): 0.5 mg via ORAL
  Filled 2013-09-19: qty 1
  Filled 2013-09-19 (×3): qty 2

## 2013-09-19 MED ORDER — ALLOPURINOL 300 MG PO TABS
300.0000 mg | ORAL_TABLET | Freq: Every day | ORAL | Status: DC
  Administered 2013-09-19 – 2013-09-25 (×7): 300 mg via ORAL
  Filled 2013-09-19 (×7): qty 1

## 2013-09-19 MED ORDER — ALBUMIN HUMAN 25 % IV SOLN
12.5000 g | Freq: Once | INTRAVENOUS | Status: AC
  Administered 2013-09-19: 12.5 g via INTRAVENOUS
  Filled 2013-09-19: qty 50

## 2013-09-19 NOTE — Significant Event (Signed)
Patient had went into A Fib when walking with physical therapists, with HR 90-145, patient is asymptomatic. Gave 5mg  lopressor, patient converted back to sinus 70-90 afterwards. BP stable with current drips. Will continue to monitor. Dalton Mitchell, Therapist, sports.

## 2013-09-19 NOTE — Progress Notes (Signed)
2 Days Post-Op Procedure(s) (LRB): EXPLORATION POST OPERATIVE OPEN HEART (N/A) Subjective: C/o incisional pain Received morphine 4 times overnight Denies nausea  Objective: Vital signs in last 24 hours: Temp:  [98 F (36.7 C)-101.1 F (38.4 C)] 98.2 F (36.8 C) (12/17 0817) Pulse Rate:  [85-120] 94 (12/17 0815) Cardiac Rhythm:  [-] Normal sinus rhythm;Sinus tachycardia (12/17 0600) Resp:  [9-29] 18 (12/17 0630) BP: (82-125)/(40-88) 100/53 mmHg (12/17 0815) SpO2:  [86 %-98 %] 96 % (12/17 0815) Arterial Line BP: (87-134)/(32-67) 119/57 mmHg (12/17 0815) Weight:  [256 lb 6.4 oz (116.302 kg)] 256 lb 6.4 oz (116.302 kg) (12/17 0600)  Hemodynamic parameters for last 24 hours: PAP: (30-32)/(15-18) 32/15 mmHg  Intake/Output from previous day: 12/16 0701 - 12/17 0700 In: 2303.4 [I.V.:1703.4; IV Piggyback:600] Out: 3485 [Urine:3325; Chest Tube:160] Intake/Output this shift: Total I/O In: 54.7 [I.V.:54.7] Out: 60 [Urine:60]  General appearance: alert and mild distress Neurologic: intact Heart: regular rate and rhythm Lungs: diminished breath sounds bibasilar Abdomen: mildly distended, nontender, + BS  Lab Results:  Recent Labs  09/18/13 1615 09/18/13 1619 09/19/13 0415  WBC 14.5*  --  14.6*  HGB 10.7* 13.6 9.9*  HCT 30.8* 40.0 29.9*  PLT 106*  --  107*   BMET:  Recent Labs  09/18/13 0400  09/18/13 1619 09/19/13 0415  NA 139  --  141 140  K 4.6  --  4.1 4.5  CL 108  --  103 104  CO2 25  --   --  28  GLUCOSE 120*  --  149* 124*  BUN 24*  --  25* 30*  CREATININE 1.63*  < > 1.90* 2.18*  CALCIUM 8.5  --   --  9.0  < > = values in this interval not displayed.  PT/INR:  Recent Labs  09/17/13 2340  LABPROT 17.1*  INR 1.43   ABG    Component Value Date/Time   PHART 7.309* 09/18/2013 0932   HCO3 23.6 09/18/2013 0932   TCO2 25 09/18/2013 1619   ACIDBASEDEF 2.0 09/18/2013 0932   O2SAT 93.0 09/18/2013 0932   CBG (last 3)   Recent Labs  09/18/13 1954  09/18/13 2300 09/19/13 0452  GLUCAP 139* 121* 100*    Assessment/Plan: S/P Procedure(s) (LRB): EXPLORATION POST OPERATIVE OPEN HEART (N/A) POD # 2 CABG/ re-exploration for bleeding CV- BP remains relatively low  Wean phenylephrine as BP allows, dc a line when off neo  Continue dopamine another 24 hours for renal perfusion  RESP- bibasilar atelectasis L>R  IS  RENAL- creatinine up to 2.2, 1.9 is baseline  Still has good UP  Will try albumin/ lasix again this AM, but with lower dose lasix  Keep foley catheter to monitor UO  ENDO- CBG well controlled, decrease levemir and change to AC/HS  DVT prophylaxis- has had SCD since op, since he is relatively immobile add enoxaparin 30 mg daily, watch platelet count  PT consult to assist with mobility     LOS: 6 days    Arleth Mccullar C 09/19/2013

## 2013-09-19 NOTE — Progress Notes (Signed)
CT surgery p.m. Rounds  Postop day 2 CABG with reexploration for bleeding Patient out of bed and chair Hemodynamic stable Maintaining renal dose dopamine for creatinine 2.1, improving Sinus rhythm Making adequate urine greater than 100 per hour

## 2013-09-19 NOTE — Evaluation (Signed)
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  Clinical Impression  Patient presents with decreased independence with mobility due to deficits listed below.  He will benefit from skilled PT in the acute setting to maximize independence and allow return home with family assist and HHPT.    PT Assessment  Patient needs continued PT services    Follow Up Recommendations  Home health PT;Supervision/Assistance - 24 hour    Does the patient have the potential to tolerate intense rehabilitation    N/A  Barriers to Discharge  None      Equipment Recommendations  Rolling walker with 5" wheels    Recommendations for Other Services   None  Frequency Min 3X/week    Precautions / Restrictions Precautions Precautions: Fall;Sternal   Pertinent Vitals/Pain 6/10 pain in incision with breathing; HR max 144 with ambulation      Mobility  Bed Mobility Bed Mobility: Sit to Supine Sit to Supine: 1: +2 Total assist Sit to Supine: Patient Percentage: 30% Details for Bed Mobility Assistance: assist for safe lowering of trunk and to lift legs into bed Transfers Transfers: Sit to Stand;Stand to Sit Sit to Stand: From chair/3-in-1;1: +2 Total assist Sit to Stand: Patient Percentage: 70% Stand to Sit: To bed;3: Mod assist Details for Transfer Assistance: use of momentum and rocking to stand with cues Ambulation/Gait Ambulation/Gait Assistance: 1: +2 Total assist Ambulation/Gait: Patient Percentage: 80% Ambulation Distance (Feet): 120 Feet Assistive device: Other (Comment) (pushing wheelchair) Ambulation/Gait Assistance Details: c/o arm fatigue.  cues for  posture Gait Pattern: Step-through pattern;Decreased stride length;Trunk flexed        PT Diagnosis: Difficulty walking;Acute pain;Generalized weakness  PT Problem List: Decreased strength;Decreased activity tolerance;Decreased mobility;Cardiopulmonary status limiting activity;Pain;Decreased knowledge of use of DME;Decreased knowledge of precautions;Decreased safety awareness PT Treatment Interventions: DME instruction;Balance training;Gait training;Functional mobility training;Patient/family education;Therapeutic activities;Therapeutic exercise;Stair training     PT Goals(Current goals can be found in the care plan section) Acute Rehab PT Goals Patient Stated Goal: To return home PT Goal Formulation: With patient Time For Goal Achievement: 10/03/13 Potential to Achieve Goals: Good  Visit Information  Last PT Received On: 09/19/13 Assistance Needed: +1 History of Present Illness: Patient admitted with unstable angina and 3 vessel CAD, 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       Prior [place] expects to be discharged to:: Private residence Living Arrangements: Spouse/significant other Available Help at Discharge: Available 24 hours/day;Family Type of Home: House Home Access: Stairs to enter [employer] of Steps: 7 Entrance Stairs-Rails: Right;Left (not very sturdy) Home Layout: One level Home Equipment: Shower seat Additional Comments: tub shower Prior Function Level of Independence: Independent Communication Communication: No difficulties Dominant Hand: Right    Cognition  Cognition Arousal/Alertness: Awake/alert Behavior During Therapy: WFL for tasks assessed/performed Overall Cognitive Status: Within Functional Limits for tasks assessed    Extremity/Trunk Assessment Lower Extremity Assessment Lower Extremity Assessment: Generalized weakness   Balance    End of Session PT  - End of Session Equipment Utilized During Treatment: Gait belt;Oxygen Activity Tolerance: Patient tolerated treatment well Patient left: in bed;with call bell/phone within  reach Nurse Communication: Mobility status  GP     [facility] 09/19/2013, 3:10 PM [place], [place] 09/19/2013

## 2013-09-19 NOTE — Anesthesia Postprocedure Evaluation (Signed)
  Anesthesia Post-op Note  Patient: Dalton Mitchell  Procedure(s) Performed: Procedure(s): EXPLORATION POST OPERATIVE OPEN HEART (N/A)  Patient Location: PACU and Nursing Unit  Anesthesia Type:General  Level of Consciousness: awake, alert , oriented and patient cooperative  Airway and Oxygen Therapy: Patient Spontanous Breathing and Patient connected to nasal cannula oxygen  Post-op Pain: mild  Post-op Assessment: Post-op Vital signs reviewed and Patient's Cardiovascular Status Stable  Post-op Vital Signs: Reviewed and stable  Complications: No apparent anesthesia complications

## 2013-09-20 ENCOUNTER — Inpatient Hospital Stay (HOSPITAL_COMMUNITY): Payer: Medicare Other

## 2013-09-20 LAB — CBC
HCT: 26.4 % — ABNORMAL LOW (ref 39.0–52.0)
Hemoglobin: 9 g/dL — ABNORMAL LOW (ref 13.0–17.0)
MCH: 32.5 pg (ref 26.0–34.0)
MCV: 95.3 fL (ref 78.0–100.0)
Platelets: 95 10*3/uL — ABNORMAL LOW (ref 150–400)
RBC: 2.77 MIL/uL — ABNORMAL LOW (ref 4.22–5.81)
RDW: 13.5 % (ref 11.5–15.5)
WBC: 8.7 10*3/uL (ref 4.0–10.5)

## 2013-09-20 LAB — BASIC METABOLIC PANEL
BUN: 34 mg/dL — ABNORMAL HIGH (ref 6–23)
CO2: 29 mEq/L (ref 19–32)
Chloride: 100 mEq/L (ref 96–112)
Creatinine, Ser: 2.19 mg/dL — ABNORMAL HIGH (ref 0.50–1.35)
GFR calc Af Amer: 34 mL/min — ABNORMAL LOW (ref >90)
Glucose, Bld: 113 mg/dL — ABNORMAL HIGH (ref 70–99)
Potassium: 4.1 mEq/L (ref 3.5–5.1)

## 2013-09-20 LAB — GLUCOSE, CAPILLARY
Glucose-Capillary: 89 mg/dL (ref 70–99)
Glucose-Capillary: 98 mg/dL (ref 70–99)

## 2013-09-20 MED ORDER — FUROSEMIDE 10 MG/ML IJ SOLN
20.0000 mg | Freq: Two times a day (BID) | INTRAMUSCULAR | Status: DC
  Administered 2013-09-20: 20 mg via INTRAVENOUS
  Filled 2013-09-20: qty 2

## 2013-09-20 MED ORDER — INSULIN DETEMIR 100 UNIT/ML ~~LOC~~ SOLN
10.0000 [IU] | Freq: Every day | SUBCUTANEOUS | Status: DC
  Administered 2013-09-20: 10 [IU] via SUBCUTANEOUS
  Filled 2013-09-20 (×2): qty 0.1

## 2013-09-20 MED ORDER — METOPROLOL TARTRATE 12.5 MG HALF TABLET
12.5000 mg | ORAL_TABLET | Freq: Two times a day (BID) | ORAL | Status: DC
  Administered 2013-09-20: 12.5 mg via ORAL
  Filled 2013-09-20 (×3): qty 1

## 2013-09-20 MED ORDER — METOPROLOL TARTRATE 25 MG/10 ML ORAL SUSPENSION
25.0000 mg | Freq: Two times a day (BID) | ORAL | Status: DC
  Filled 2013-09-20 (×3): qty 10

## 2013-09-20 NOTE — Progress Notes (Signed)
UR Completed.  Dalton Mitchell [id number] 09/20/2013

## 2013-09-20 NOTE — Progress Notes (Signed)
Patient ID: Dalton Mitchell, male   DOB: [birth year], 67 y.o.   MRN: [id number] EVENING ROUNDS NOTE :     [address]       [place] [address]             [phone]                 3 Days Post-Op Procedure(s) (LRB): EXPLORATION POST OPERATIVE OPEN HEART (N/A)  Total Length of Stay:  LOS: 7 days  BP 123/78  Pulse 98  Temp(Src) 98.2 F (36.8 C) (Oral)  Resp 16  Ht 5\' 9"  (1.753 m)  Wt 256 lb 6.3 oz (116.3 kg)  BMI 37.85 kg/m2  SpO2 98%  .Intake/Output     12/18 0701 - 12/19 0700   P.O. 240   I.V. (mL/kg) 271.9 (2.3)   IV Piggyback    Total Intake(mL/kg) 511.9 (4.4)   Urine (mL/kg/hr) 3300 (2)   Total Output 3300   Net -2788.1         . sodium chloride Stopped (09/18/13 1000)  . sodium chloride Stopped (09/18/13 1000)  . sodium chloride 250 mL (09/18/13 0539)  . lactated ringers 20 mL/hr at 09/20/13 0800  . nitroGLYCERIN 0 mcg/min (09/17/13 2345)  . phenylephrine (NEO-SYNEPHRINE) Adult infusion Stopped (09/19/13 1800)     Lab Results  Component Value Date   WBC 8.7 09/20/2013   HGB 9.0* 09/20/2013   HCT 26.4* 09/20/2013   PLT 95* 09/20/2013   GLUCOSE 113* 09/20/2013   ALT 20 09/13/2013   AST 21 09/13/2013   NA 136 09/20/2013   K 4.1 09/20/2013   CL 100 09/20/2013   CREATININE 2.19* 09/20/2013   BUN 34* 09/20/2013   CO2 29 09/20/2013   TSH 0.707 09/18/2013   INR 1.43 09/17/2013   Walked 150 feet Cr 2.19,  Chronic Kidney Disease       Stage IIIB GFR 30-44      Lab Results  Component Value Date   CREATININE 2.19* 09/20/2013   Estimated Creatinine Clearance: 41.7 ml/min (by C-G formula based on Cr of 2.19).   Grace Isaac MD  Beeper [phone] Office [phone] 09/20/2013 9:33 PM

## 2013-09-20 NOTE — Progress Notes (Signed)
Subjective:  He c/o blurred vision on looking to the left.  Mild lethargic and mild abdominal fullness. Not SOB.  Objective:  Vital Signs in the last 24 hours: BP 111/70  Pulse 95  Temp(Src) 99.3 F (37.4 C) (Oral)  Resp 17  Ht 5\' 9"  (1.753 m)  Wt 116.3 kg (256 lb 6.3 oz)  BMI 37.85 kg/m2  SpO2 96%  Physical Exam: Pale WM in NAD Lungs:  Reduced BS Cardiac:  Regular rhythm, normal S1 and S2, no S3 Abdomen:  Soft, nontender, no masses Extremities:  1+ edema present  Intake/Output from previous day: 12/17 0701 - 12/18 0700 In: 1383.5 [P.O.:480; I.V.:803.5; IV Piggyback:100] Out: 3080 [Urine:3080] Weight Filed Weights   09/18/13 0500 09/19/13 0600 09/20/13 0314  Weight: 117.572 kg (259 lb 3.2 oz) 116.302 kg (256 lb 6.4 oz) 116.3 kg (256 lb 6.3 oz)    Lab Results: Basic Metabolic Panel:  Recent Labs  09/19/13 1700 09/20/13 0400  NA 136 136  K 4.1 4.1  CL 101 100  CO2 27 29  GLUCOSE 124* 113*  BUN 33* 34*  CREATININE 2.09* 2.19*    CBC:  Recent Labs  09/19/13 0415 09/20/13 0400  WBC 14.6* 8.7  HGB 9.9* 9.0*  HCT 29.9* 26.4*  MCV 95.5 95.3  PLT 107* 95*   Telemetry: sinus rhythm, transient a fib with ambulation  Assessment/Plan:  1. Stable post recent CABG 2. Renal function with slight worsening currently on low dose dopamine 3. Post op volume excess getting diuresed 4. Blurred vision will need to monitor, no other gross neuro defects.     Kerry Hough  MD [facility]  09/20/2013, 8:58 AM

## 2013-09-20 NOTE — Progress Notes (Signed)
Patient ID: Dalton Mitchell, male   DOB: Dec 11, 1945, 67 y.o.   MRN: [id number] S:I was asked to re-evaluate pt given recent worsening of Scr and initiation of renal dose dopamine and aggressive diuresis.  Pt feels well and is without complaints. O:BP 109/68  Pulse 94  Temp(Src) 99.3 F (37.4 C) (Oral)  Resp 16  Ht 5\' 9"  (1.753 m)  Wt 116.3 kg (256 lb 6.3 oz)  BMI 37.85 kg/m2  SpO2 96%  Intake/Output Summary (Last 24 hours) at 09/20/13 0959 Last data filed at 09/20/13 0900  Gross per 24 hour  Intake 1342.74 ml  Output   3860 ml  Net -2517.26 ml   Intake/Output: I/O last 3 completed shifts: In: 2337.7 [P.O.:480; I.V.:1707.7; IV Piggyback:150] Out: [id number] [Urine:3980]  Intake/Output this shift:  Total I/O In: 53.2 [I.V.:53.2] Out: 700 [Urine:700] Weight change: -0.002 kg (-0.1 oz) Gen:WD WN WM in NAD CVS:RRR Resp:cta with poor inspiratory effort with splinting [id number], high-pitched BS, nontender Ext:no edema   Recent Labs Lab 09/13/13 1040 09/13/13 1252  09/15/13 0825 09/16/13 0415 09/17/13 0430  09/17/13 1747 09/17/13 1750 09/17/13 2149 09/17/13 2345 09/18/13 0400 09/18/13 1615 09/18/13 1619 09/19/13 0415 09/19/13 1700 09/20/13 0400  NA 139 141  < > 140 141 141  < > 140  --  142 140 139  --  141 140 136 136  K 4.1 4.4  < > 4.2 4.4 4.0  < > 4.3  --  4.0 4.0 4.6  --  4.1 4.5 4.1 4.1  CL 105 105  < > 108 108 106  --  106  --   --   --  108  --  103 104 101 100  CO2 22 24  < > 22 23 23   --   --   --   --   --  25  --   --  28 27 29   GLUCOSE 98 92  < > 108* 91 126*  < > 134*  --  140* 125* 120*  --  149* 124* 124* 113*  BUN 30* 29*  < > 25* 31* 32*  --  28*  --   --   --  24*  --  25* 30* 33* 34*  CREATININE 1.93* 1.93*  < > 1.78* 1.94* 1.92*  --  1.70* 1.73*  --   --  1.63* 1.86* 1.90* 2.18* 2.09* 2.19*  ALBUMIN 4.1 4.2  --   --   --   --   --   --   --   --   --   --   --   --   --   --   --   CALCIUM 9.6 9.4  < > 9.4 9.5 9.6  --   --   --   --   --  8.5   --   --  9.0 8.4 8.7  AST 20 21  --   --   --   --   --   --   --   --   --   --   --   --   --   --   --   ALT 23 20  --   --   --   --   --   --   --   --   --   --   --   --   --   --   --   < > = values in  this interval not displayed. Liver Function Tests:  Recent Labs Lab 09/13/13 1040 09/13/13 1252  AST 20 21  ALT 23 20  ALKPHOS 57 57  BILITOT 0.4 0.4  PROT 7.6 7.6  ALBUMIN 4.1 4.2   No results found for this basename: LIPASE, AMYLASE,  in the last 168 hours No results found for this basename: AMMONIA,  in the last 168 hours CBC:  Recent Labs Lab 09/13/13 1040  09/17/13 2340  09/18/13 0400 09/18/13 1615 09/18/13 1619 09/19/13 0415 09/20/13 0400  WBC 6.6  < > 13.7*  --  13.5* 14.5*  --  14.6* 8.7  NEUTROABS 4.1  --   --   --   --   --   --   --   --   HGB 16.5  < > 11.2*  < > 11.2* 10.7* 13.6 9.9* 9.0*  HCT 48.4  < > 33.2*  < > 33.2* 30.8* 40.0 29.9* 26.4*  MCV 95.1  < > 94.9  --  94.9 93.9  --  95.5 95.3  PLT 178  < > 100*  --  102* 106*  --  107* 95*  < > = values in this interval not displayed. Cardiac Enzymes:  Recent Labs Lab 09/13/13 1040 09/13/13 1254 09/13/13 1800 09/14/13 0020  TROPONINI <0.30 <0.30 <0.30 <0.30   CBG:  Recent Labs Lab 09/19/13 1556 09/19/13 1707 09/19/13 1919 09/19/13 2244 09/20/13 0830  GLUCAP 117* 118* 108* 100* 99    Iron Studies: No results found for this basename: IRON, TIBC, TRANSFERRIN, FERRITIN,  in the last 72 hours Studies/Results: Dg Chest Port 1 View  09/20/2013   CLINICAL DATA:  Post cardiac surgery.  EXAM: PORTABLE CHEST - 1 VIEW  COMPARISON:  09/19/2013  FINDINGS: Mild elevation of the right hemidiaphragm is unchanged. Stable enlargement of the cardiac silhouette. Stable position of the right jugular central venous catheter. Patient continues to have low lung volumes and densities at the left costophrenic angle. Left basilar densities could represent atelectasis and a small amount of pleural fluid. No  evidence for pulmonary edema  IMPRESSION: Stable chest radiograph findings. Left basilar densities may represent a combination of atelectasis and small effusion.   Electronically Signed   By: Markus Daft M.D.   On: 09/20/2013 07:59   Dg Chest Port 1 View  09/19/2013   CLINICAL DATA:  Coronary artery disease  EXAM: PORTABLE CHEST - 1 VIEW  COMPARISON:  September 18, 2013  FINDINGS: Endotracheal tube and nasogastric tube have been removed. Mediastinal drain and left chest tube have been removed. Swan-Ganz catheter is been removed. Cordis tip is in the superior vena cava. No demonstrable pneumothorax.  There is been partial clearing of consolidation the left base. Patchy infiltrate remains in the left base. There is a small left effusion. The right lung is clear. Heart is mildly enlarged with normal pulmonary vascularity, stable. No adenopathy.  IMPRESSION: No demonstrable pneumothorax. Less consolidation left base. Small left effusion persists. Stable cardiac prominence.   Electronically Signed   By: Lowella Grip M.D.   On: 09/19/2013 08:10   . acetaminophen  1,000 mg Oral Q6H   Or  . acetaminophen (TYLENOL) oral liquid 160 mg/5 mL  1,000 mg Per Tube Q6H  . allopurinol  300 mg Oral Daily  . aspirin EC  325 mg Oral Daily   Or  . aspirin  324 mg Per Tube Daily  . atorvastatin  10 mg Oral QHS  . bisacodyl  10 mg Oral Daily  Or  . bisacodyl  10 mg Rectal Daily  . docusate sodium  200 mg Oral Daily  . enoxaparin (LOVENOX) injection  30 mg Subcutaneous Q24H  . fluticasone  2 spray Each Nare QHS  . insulin aspart  0-9 Units Subcutaneous TID WC  . insulin detemir  10 Units Subcutaneous Daily  . insulin regular  0-10 Units Intravenous TID WC  . levothyroxine  75 mcg Oral QAC breakfast  . metoprolol tartrate  12.5 mg Oral BID   Or  . metoprolol tartrate  12.5 mg Per Tube BID  . pantoprazole  40 mg Oral Daily  . sodium chloride  3 mL Intravenous Q12H    BMET    Component Value Date/Time   NA  136 09/20/2013 0400   K 4.1 09/20/2013 0400   CL 100 09/20/2013 0400   CO2 29 09/20/2013 0400   GLUCOSE 113* 09/20/2013 0400   BUN 34* 09/20/2013 0400   CREATININE 2.19* 09/20/2013 0400   CALCIUM 8.7 09/20/2013 0400   GFRNONAA 30* 09/20/2013 0400   GFRAA 34* 09/20/2013 0400   CBC    Component Value Date/Time   WBC 8.7 09/20/2013 0400   RBC 2.77* 09/20/2013 0400   HGB 9.0* 09/20/2013 0400   HCT 26.4* 09/20/2013 0400   PLT 95* 09/20/2013 0400   MCV 95.3 09/20/2013 0400   MCH 32.5 09/20/2013 0400   MCHC 34.1 09/20/2013 0400   RDW 13.5 09/20/2013 0400   LYMPHSABS 1.6 09/13/2013 1040   MONOABS 0.8 09/13/2013 1040   EOSABS 0.1 09/13/2013 1040   BASOSABS 0.0 09/13/2013 1040     Assessment/Plan:  1. AKI/CKD stage 3 (baseline Scr has been 1.8)- good UOP post-operatively. We were asked to re-evaluate pt with rising Scr over the last 5 days.  Since that time, he has been negative 2.5L over the last 24 hours and 4 over the last 48.  Also had significant drop in Hgb from 13 to 9.  Pt has been given Lasix IV for the last 3 days with marked UOP of 3-5L and started on renal dose dopamine after he dropped his SBP in the 90's after large diuresis. 1. Stop lasix given aggressive diuresis and bump in Scr and BP 2. Wean dopamine to off (keeping MAP >60)  3. Follow UOP and daily Scr and will calculate FeNa in am as he is likely pre-renal given sig diuresis. 4. Cont to hold  ARB  5. May benefit from blood transfusion given sig drop in H/H 2. CAD s/p CABG x 4. Doing well. Plan per CT surgery 3. ABLA- may benefit from blood transfusion if he continues to drop 4. Hyperglycemia- per protocol 5. HTN- stable 6. Hypothyroidism- now on home dose of levothyroxine.  TSH Was low. Will cont to follow.  7.   Heloise Gordan A

## 2013-09-20 NOTE — Progress Notes (Addendum)
TCTS DAILY ICU PROGRESS NOTE                   [address]            [place] [address]          [phone]   3 Days Post-Op Procedure(s) (LRB): EXPLORATION POST OPERATIVE OPEN HEART (N/A)  Total Length of Stay:  LOS: 7 days   Subjective: He has complaints of incisional pain this am. He is only taking liquids. He states he does not have much appetite and feels bloated, but denies abdominal pain, nausea, or emesis.  Objective: Vital signs in last 24 hours: Temp:  [98.2 F (36.8 C)-99.3 F (37.4 C)] 99.3 F (37.4 C) (12/18 0400) Pulse Rate:  [78-144] 97 (12/18 0700) Cardiac Rhythm:  [-] Normal sinus rhythm (12/18 0600) Resp:  [10-27] 17 (12/18 0700) BP: (86-131)/(53-73) 120/64 mmHg (12/18 0700) SpO2:  [85 %-100 %] 95 % (12/18 0700) Arterial Line BP: (89-198)/(34-139) 135/91 mmHg (12/18 0700) Weight:  [116.3 kg (256 lb 6.3 oz)] 116.3 kg (256 lb 6.3 oz) (12/18 0314)  Filed Weights   09/18/13 0500 09/19/13 0600 09/20/13 0314  Weight: 117.572 kg (259 lb 3.2 oz) 116.302 kg (256 lb 6.4 oz) 116.3 kg (256 lb 6.3 oz)    Weight change: -0.002 kg (-0.1 oz)   Hemodynamic parameters for last 24 hours:    Intake/Output from previous day: 12/17 0701 - 12/18 0700 In: 1383.5 [P.O.:480; I.V.:803.5; IV Piggyback:100] Out: 3080 [Urine:3080]  Intake/Output this shift:    Current Meds: Scheduled Meds: . acetaminophen  1,000 mg Oral Q6H   Or  . acetaminophen (TYLENOL) oral liquid 160 mg/5 mL  1,000 mg Per Tube Q6H  . allopurinol  300 mg Oral Daily  . aspirin EC  325 mg Oral Daily   Or  . aspirin  324 mg Per Tube Daily  . atorvastatin  10 mg Oral QHS  . bisacodyl  10 mg Oral Daily   Or  . bisacodyl  10 mg Rectal Daily  . docusate sodium  200 mg Oral Daily  . enoxaparin (LOVENOX) injection  30 mg Subcutaneous Q24H  . fluticasone  2 spray Each Nare QHS  . insulin aspart  0-9 Units Subcutaneous TID WC  . insulin detemir  15 Units Subcutaneous Daily  . insulin  regular  0-10 Units Intravenous TID WC  . levothyroxine  75 mcg Oral QAC breakfast  . metoprolol tartrate  12.5 mg Oral BID   Or  . metoprolol tartrate  12.5 mg Per Tube BID  . pantoprazole  40 mg Oral Daily  . sodium chloride  3 mL Intravenous Q12H   Continuous Infusions: . sodium chloride Stopped (09/18/13 1000)  . sodium chloride Stopped (09/18/13 1000)  . sodium chloride 250 mL (09/18/13 0539)  . dexmedetomidine Stopped (09/18/13 0730)  . DOPamine 3 mcg/kg/min (09/20/13 0700)  . lactated ringers 20 mL/hr at 09/20/13 0700  . nitroGLYCERIN 0 mcg/min (09/17/13 2345)  . phenylephrine (NEO-SYNEPHRINE) Adult infusion Stopped (09/19/13 1800)   PRN Meds:.ALPRAZolam, levalbuterol, metoprolol, morphine injection, ondansetron (ZOFRAN) IV, oxyCODONE, sodium chloride  General appearance: alert, cooperative and no distress Neurologic: intact Heart: regular rate and rhythm, S1, S2 normal, no murmur, click, rub or gallop Lungs: Diminished at bases bilaterally Abdomen: soft, non-tender; bowel sounds normal; no masses,  no organomegaly Extremities: Bilateral LE edema Wound: Dressings are clean and dry  Lab Results: CBC: Recent Labs  09/19/13 0415 09/20/13 0400  WBC 14.6* 8.7  HGB 9.9* 9.0*  HCT 29.9* 26.4*  PLT 107* 95*   BMET:  Recent Labs  09/19/13 1700 09/20/13 0400  NA 136 136  K 4.1 4.1  CL 101 100  CO2 27 29  GLUCOSE 124* 113*  BUN 33* 34*  CREATININE 2.09* 2.19*  CALCIUM 8.4 8.7    PT/INR:  Recent Labs  09/17/13 2340  LABPROT 17.1*  INR 1.43   Radiology: Dg Chest Port 1 View  09/19/2013   CLINICAL DATA:  Coronary artery disease  EXAM: PORTABLE CHEST - 1 VIEW  COMPARISON:  September 18, 2013  FINDINGS: Endotracheal tube and nasogastric tube have been removed. Mediastinal drain and left chest tube have been removed. Swan-Ganz catheter is been removed. Cordis tip is in the superior vena cava. No demonstrable pneumothorax.  There is been partial clearing of  consolidation the left base. Patchy infiltrate remains in the left base. There is a small left effusion. The right lung is clear. Heart is mildly enlarged with normal pulmonary vascularity, stable. No adenopathy.  IMPRESSION: No demonstrable pneumothorax. Less consolidation left base. Small left effusion persists. Stable cardiac prominence.   Electronically Signed   By: Lowella Grip M.D.   On: 09/19/2013 08:10     Assessment/Plan: S/P Procedure(s) (LRB): EXPLORATION POST OPERATIVE OPEN HEART (N/A)  1.CV-Had brief a fib when walking with PT yesterday. Given IV Lopressor and converted to SR. Remains in SR in the 90's this am. On Lopressor 12.5 bid. May need to increase Lopressor to 25 bid. 2.Pulmonary-CXR this am appears to show no pneumothorax, small left pleural effusion, bibasilar atelectasis, cardiomegaly. Encourage incentive spirometer and flutter valve 3.Volume overload-will give Lasix when creatinine decreases 4.ABL anemia- H and H 9 and 26.4 this am 5.Thrombocytopenia-platelets slightly decreased to 95,000 6.CKD (stage III)-Creatinine 2.19 this am (baseline is 1.9). On renal dopamine. 7.CBGs 118/108/100. On Levemir 15 units daily. Will decrease as not eating much.Will check HGA1C. 8.GI-on clear liquids. Hope to advance today. 9.Continue with PT  ZIMMERMAN,DONIELLE M PA-C 09/20/2013 7:57 AM  Excellent diuresis Creat stable- wean renal dop [employer] metoprolol25 bid

## 2013-09-21 ENCOUNTER — Inpatient Hospital Stay (HOSPITAL_COMMUNITY): Payer: Medicare Other

## 2013-09-21 LAB — RENAL FUNCTION PANEL
Albumin: 3.1 g/dL — ABNORMAL LOW (ref 3.5–5.2)
BUN: 31 mg/dL — ABNORMAL HIGH (ref 6–23)
Calcium: 8.7 mg/dL (ref 8.4–10.5)
Chloride: 101 mEq/L (ref 96–112)
Creatinine, Ser: 1.99 mg/dL — ABNORMAL HIGH (ref 0.50–1.35)
Phosphorus: 3 mg/dL (ref 2.3–4.6)
Potassium: 4.1 mEq/L (ref 3.5–5.1)

## 2013-09-21 LAB — COMPREHENSIVE METABOLIC PANEL
ALT: 15 U/L (ref 0–53)
AST: 26 U/L (ref 0–37)
Albumin: 3.2 g/dL — ABNORMAL LOW (ref 3.5–5.2)
Alkaline Phosphatase: 33 U/L — ABNORMAL LOW (ref 39–117)
BUN: 30 mg/dL — ABNORMAL HIGH (ref 6–23)
CO2: 31 mEq/L (ref 19–32)
Calcium: 8.5 mg/dL (ref 8.4–10.5)
Chloride: 101 mEq/L (ref 96–112)
Creatinine, Ser: 1.98 mg/dL — ABNORMAL HIGH (ref 0.50–1.35)
GFR calc Af Amer: 39 mL/min — ABNORMAL LOW (ref >90)
GFR calc non Af Amer: 33 mL/min — ABNORMAL LOW (ref >90)
Glucose, Bld: 90 mg/dL (ref 70–99)
Potassium: 4 mEq/L (ref 3.5–5.1)
Sodium: 138 mEq/L (ref 135–145)
Total Bilirubin: 0.3 mg/dL (ref 0.3–1.2)
Total Protein: 6 g/dL (ref 6.0–8.3)

## 2013-09-21 LAB — URINALYSIS, ROUTINE W REFLEX MICROSCOPIC
Bilirubin Urine: NEGATIVE
Glucose, UA: NEGATIVE mg/dL
Ketones, ur: NEGATIVE mg/dL
Protein, ur: 100 mg/dL — AB
pH: 6 (ref 5.0–8.0)

## 2013-09-21 LAB — URINE MICROSCOPIC-ADD ON

## 2013-09-21 LAB — SODIUM, URINE, RANDOM: Sodium, Ur: 61 mEq/L

## 2013-09-21 LAB — CBC
HCT: 27 % — ABNORMAL LOW (ref 39.0–52.0)
MCH: 31.4 pg (ref 26.0–34.0)
MCHC: 32.6 g/dL (ref 30.0–36.0)
Platelets: 127 10*3/uL — ABNORMAL LOW (ref 150–400)
RDW: 13.5 % (ref 11.5–15.5)
WBC: 5.1 10*3/uL (ref 4.0–10.5)

## 2013-09-21 LAB — GLUCOSE, CAPILLARY
Glucose-Capillary: 87 mg/dL (ref 70–99)
Glucose-Capillary: 116 mg/dL — ABNORMAL HIGH (ref 70–99)
Glucose-Capillary: 103 mg/dL — ABNORMAL HIGH (ref 70–99)

## 2013-09-21 LAB — HEMOGLOBIN A1C: Hgb A1c MFr Bld: 5.8 % — ABNORMAL HIGH (ref <5.7)

## 2013-09-21 LAB — TSH: TSH: 6.735 u[IU]/mL — ABNORMAL HIGH (ref 0.350–4.500)

## 2013-09-21 LAB — MAGNESIUM: Magnesium: 2.3 mg/dL (ref 1.5–2.5)

## 2013-09-21 MED ORDER — AMIODARONE HCL IN DEXTROSE 360-4.14 MG/200ML-% IV SOLN
INTRAVENOUS | Status: AC
  Filled 2013-09-21: qty 200

## 2013-09-21 MED ORDER — AMIODARONE HCL 200 MG PO TABS
400.0000 mg | ORAL_TABLET | Freq: Two times a day (BID) | ORAL | Status: DC
  Filled 2013-09-21: qty 2

## 2013-09-21 MED ORDER — METOPROLOL TARTRATE 25 MG/10 ML ORAL SUSPENSION
25.0000 mg | Freq: Two times a day (BID) | ORAL | Status: DC
  Filled 2013-09-21 (×4): qty 10

## 2013-09-21 MED ORDER — METOPROLOL TARTRATE 25 MG PO TABS
25.0000 mg | ORAL_TABLET | Freq: Two times a day (BID) | ORAL | Status: DC
  Administered 2013-09-21 – 2013-09-22 (×3): 25 mg via ORAL
  Filled 2013-09-21 (×4): qty 1

## 2013-09-21 MED ORDER — AMIODARONE HCL IN DEXTROSE 360-4.14 MG/200ML-% IV SOLN
60.0000 mg/h | INTRAVENOUS | Status: DC
  Administered 2013-09-21: 60 mg/h via INTRAVENOUS

## 2013-09-21 MED ORDER — AMIODARONE IV BOLUS ONLY 150 MG/100ML
150.0000 mg | Freq: Once | INTRAVENOUS | Status: AC
  Administered 2013-09-21: 150 mg via INTRAVENOUS
  Filled 2013-09-21: qty 100

## 2013-09-21 MED ORDER — AMIODARONE HCL 200 MG PO TABS
400.0000 mg | ORAL_TABLET | Freq: Three times a day (TID) | ORAL | Status: DC
  Administered 2013-09-21 – 2013-09-23 (×8): 400 mg via ORAL
  Filled 2013-09-21 (×12): qty 2

## 2013-09-21 MED ORDER — LACTULOSE 10 GM/15ML PO SOLN
30.0000 g | Freq: Every day | ORAL | Status: DC | PRN
  Administered 2013-09-21: 30 g via ORAL
  Filled 2013-09-21: qty 45

## 2013-09-21 MED ORDER — INSULIN DETEMIR 100 UNIT/ML ~~LOC~~ SOLN
4.0000 [IU] | Freq: Every day | SUBCUTANEOUS | Status: DC
  Administered 2013-09-21: 4 [IU] via SUBCUTANEOUS
  Filled 2013-09-21 (×2): qty 0.04

## 2013-09-21 MED ORDER — AMIODARONE HCL 200 MG PO TABS
400.0000 mg | ORAL_TABLET | Freq: Two times a day (BID) | ORAL | Status: DC
  Administered 2013-09-21: 400 mg via ORAL
  Filled 2013-09-21 (×2): qty 2

## 2013-09-21 MED ORDER — AMIODARONE LOAD VIA INFUSION
150.0000 mg | Freq: Once | INTRAVENOUS | Status: AC
  Administered 2013-09-21: 150 mg via INTRAVENOUS
  Filled 2013-09-21: qty 83.34

## 2013-09-21 MED ORDER — VANCOMYCIN HCL 10 G IV SOLR: 1250.0000 mg | INTRAVENOUS | Status: DC

## 2013-09-21 MED ORDER — AMIODARONE HCL 200 MG PO TABS: 400.0000 mg | ORAL_TABLET | Freq: Every day | ORAL | Status: DC

## 2013-09-21 MED ORDER — ENSURE COMPLETE PO LIQD
237.0000 mL | Freq: Three times a day (TID) | ORAL | Status: DC
  Administered 2013-09-21 – 2013-09-24 (×10): 237 mL via ORAL

## 2013-09-21 MED ORDER — AMIODARONE HCL IN DEXTROSE 360-4.14 MG/200ML-% IV SOLN
30.0000 mg/h | INTRAVENOUS | Status: DC
  Filled 2013-09-21 (×2): qty 200

## 2013-09-21 MED ORDER — DEXTROSE 5 % IV SOLN
1.5000 g | INTRAVENOUS | Status: DC
  Filled 2013-09-21: qty 1.5

## 2013-09-21 NOTE — Progress Notes (Signed)
Patient ID: Dalton Mitchell, male   DOB: [birth year], 67 y.o.   MRN: [id number]  SICU Evening Rounds:  Hemodynamically stable. He had 2 episodes of A-fib today and was started on amio with IV bolus and po.  He continues to diurese well  Ambulated around ICU.

## 2013-09-21 NOTE — Progress Notes (Signed)
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   PT Comments   Pt progressing although has been somewhat slower than typical due to medical issues that have arisen. Pt tolerated walking on RA with SaO2>95%. Remains with flat affect.   Follow Up Recommendations  Home health PT;Supervision/Assistance - 24 hour     Does the patient have the potential to tolerate intense rehabilitation     Barriers to Discharge        Equipment Recommendations  Rolling walker with 5" wheels    Recommendations for Other Services OT consult  Frequency Min 3X/week   Progress towards PT Goals Progress towards PT goals: Progressing toward goals  Plan Current plan remains appropriate    Precautions / Restrictions Precautions Precautions: Fall;Sternal Precaution Comments: pt unable to state; instructed in precautions Restrictions Weight Bearing Restrictions: No Other Position/Activity Restrictions: sternal precautions   Pertinent Vitals/Pain HR 86-106 SaO2 93-97% on RA (lower when seated and improved with walking) Pain "not much" regarding chest    Mobility  Bed Mobility Bed Mobility: Not assessed Transfers Transfers: Sit to Stand;Stand to Sit Sit to Stand: 4: Min assist Stand to Sit: 4: Min guard Details for Transfer Assistance: vc to adhere to sternal precautions as scooting to EOC; holding pillow to chest to come to stand to facilitate maintaining precautions; physical assist to initiate lift-off from elevated chair Ambulation/Gait Ambulation/Gait Assistance: 4: Min assist Ambulation Distance (Feet): 120 Feet Assistive device: Rolling  walker Ambulation/Gait Assistance Details: vc for safe use of RW (tends to get too close to RW and begin to lean posterior); assist to turn/maneuver RW; steadying assist Gait Pattern: Step-through pattern;Decreased stride length;Trunk flexed    Exercises General Exercises - Upper Extremity Shoulder Flexion: AROM;Both;Other reps (comment);Seated Digit Composite Flexion: AROM;Both;10 reps;Seated Composite Extension: AROM;Both;10 reps;Seated General Exercises - Lower Extremity Ankle Circles/Pumps: AROM;Both;20 reps;Seated Long Arc Quad: AROM;Both;10 reps;Seated Hip Flexion/Marching: AROM;Both;10 reps;Seated Other Exercises Other Exercises: performed AROM exercieses prior to attempting ambulation to monitor how pt's HR reponded to incr acitivty (was in Afib with RVR earlier this a.m.) Pt did well and progressed to standing and walking   PT Diagnosis:    PT Problem List:   PT Treatment Interventions:     PT Goals (current goals can now be found in the care plan section)    Visit Information  Last PT Received On: 09/21/13 Assistance Needed: +1 History of Present Illness: Patient admitted with unstable angina and 3 vessel CAD, 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    Subjective Data      Cognition  Cognition Arousal/Alertness: Awake/alert Behavior During Therapy: Flat affect Overall Cognitive Status: Within Functional Limits for tasks assessed    Balance     End of Session PT - End of Session Equipment Utilized During Treatment: Gait belt Activity Tolerance: Patient tolerated treatment well Patient left: in chair;with call bell/phone within reach;with nursing/sitter in room Nurse Communication: Mobility status   GP     [name] 09/21/2013, 11:10 AM Pager [phone]

## 2013-09-21 NOTE — Progress Notes (Signed)
Pt went into A fib RVR with HR in 140-160s while being transported in wheelchair to radiology for 2 view chest x-ray. Pt was still in the hallway on [place] when A fib occurred and was transported back to room. Pt placed in bed and given 5 mg of Metoprolol IV. 12 lead EKG confirmed A fib. HR after Metoprolol was 120s-140s. Dr. Servando Snare notified and order received for Amiodarone protocol. Pt received 150 mg Amiodarone bolus then gtt started at 60 mg/hr. Will continue to monitor.  Vella Raring, RN

## 2013-09-21 NOTE — Significant Event (Signed)
Patient was reported to have gone into A Fib past noon time today, with HR as high as 130s. Patient was given 5mg  lopressor. When RN assessed patient, patient still in A Fib with controlled rates 100-115. Amiodarone drip at 30mg , patient had already received amiodarone PO. MD Oran Rein at bedside. New orders received.  Will intervene and monitor closely. Dalton Mitchell, Therapist, sports.

## 2013-09-21 NOTE — Progress Notes (Addendum)
TCTS DAILY ICU PROGRESS NOTE                   [address]            [place] [address]          [phone]   4 Days Post-Op Procedure(s) (LRB): EXPLORATION POST OPERATIVE OPEN HEART (N/A)  Total Length of Stay:  LOS: 8 days   Subjective: He has complaints of abdominal fullness, but no tenderness. Had nausea yesterday but not so far this am. Passing flatus.  Objective: Vital signs in last 24 hours: Temp:  [97.8 F (36.6 C)-99.2 F (37.3 C)] 99.2 F (37.3 C) (12/19 0000) Pulse Rate:  [87-103] 87 (12/19 0700) Cardiac Rhythm:  [-] Atrial fibrillation (12/19 0600) Resp:  [11-22] 14 (12/19 0700) BP: (98-131)/(56-82) 106/63 mmHg (12/19 0700) SpO2:  [93 %-100 %] 97 % (12/19 0700) Arterial Line BP: (107-146)/(54-70) 107/54 mmHg (12/18 1500) Weight:  [112.583 kg (248 lb 3.2 oz)] 112.583 kg (248 lb 3.2 oz) (12/19 0500)  Filed Weights   09/19/13 0600 09/20/13 0314 09/21/13 0500  Weight: 116.302 kg (256 lb 6.4 oz) 116.3 kg (256 lb 6.3 oz) 112.583 kg (248 lb 3.2 oz)    Weight change: -3.717 kg (-8 lb 3.1 oz)   Hemodynamic parameters for last 24 hours:    Intake/Output from previous day: 12/18 0701 - 12/19 0700 In: 731.9 [P.O.:240; I.V.:491.9] Out: 6100 [Urine:6100]  Intake/Output this shift:    Current Meds: Scheduled Meds: . acetaminophen  1,000 mg Oral Q6H   Or  . acetaminophen (TYLENOL) oral liquid 160 mg/5 mL  1,000 mg Per Tube Q6H  . allopurinol  300 mg Oral Daily  . amiodarone  400 mg Oral Q12H   Followed by  . [START ON 09/29/2013] amiodarone  400 mg Oral Daily  . aspirin EC  325 mg Oral Daily   Or  . aspirin  324 mg Per Tube Daily  . atorvastatin  10 mg Oral QHS  . bisacodyl  10 mg Oral Daily   Or  . bisacodyl  10 mg Rectal Daily  . docusate sodium  200 mg Oral Daily  . enoxaparin (LOVENOX) injection  30 mg Subcutaneous Q24H  . fluticasone  2 spray Each Nare QHS  . insulin aspart  0-9 Units Subcutaneous TID WC  . insulin detemir  10  Units Subcutaneous Daily  . insulin regular  0-10 Units Intravenous TID WC  . levothyroxine  75 mcg Oral QAC breakfast  . metoprolol tartrate  25 mg Per Tube BID   Or  . metoprolol tartrate  12.5 mg Oral BID  . pantoprazole  40 mg Oral Daily  . sodium chloride  3 mL Intravenous Q12H   Continuous Infusions: . sodium chloride Stopped (09/18/13 1000)  . sodium chloride Stopped (09/18/13 1000)  . sodium chloride 250 mL (09/18/13 0539)  . amiodarone (NEXTERONE PREMIX) 360 mg/200 mL dextrose 60 mg/hr (09/21/13 0630)   Followed by  . amiodarone (NEXTERONE PREMIX) 360 mg/200 mL dextrose    . lactated ringers 20 mL/hr at 09/20/13 0800  . nitroGLYCERIN 0 mcg/min (09/17/13 2345)  . phenylephrine (NEO-SYNEPHRINE) Adult infusion Stopped (09/19/13 1800)   PRN Meds:.ALPRAZolam, levalbuterol, metoprolol, morphine injection, ondansetron (ZOFRAN) IV, oxyCODONE, sodium chloride  General appearance: alert, cooperative and no distress Neurologic: intact Heart: regular rate and rhythm, S1, S2 normal, no murmur, click, rub or gallop Lungs: Slightly diminished at bases bilaterally Abdomen: soft, non-tender; bowel sounds normal; no masses,  no organomegaly Extremities: Bilateral LE edema Wound: Dressings are clean and dry  Lab Results: CBC:  Recent Labs  09/20/13 0400 09/21/13 0400  WBC 8.7 5.1  HGB 9.0* 8.8*  HCT 26.4* 27.0*  PLT 95* 127*   BMET:   Recent Labs  09/20/13 0400 09/21/13 0400  NA 136 139  138  K 4.1 4.1  4.0  CL 100 101  101  CO2 29 32  31  GLUCOSE 113* 86  90  BUN 34* 31*  30*  CREATININE 2.19* 1.99*  1.98*  CALCIUM 8.7 8.7  8.5    PT/INR: No results found for this basename: LABPROT, INR,  in the last 72 hours Radiology: Dg Chest Port 1 View  09/20/2013   CLINICAL DATA:  Post cardiac surgery.  EXAM: PORTABLE CHEST - 1 VIEW  COMPARISON:  09/19/2013  FINDINGS: Mild elevation of the right hemidiaphragm is unchanged. Stable enlargement of the cardiac silhouette.  Stable position of the right jugular central venous catheter. Patient continues to have low lung volumes and densities at the left costophrenic angle. Left basilar densities could represent atelectasis and a small amount of pleural fluid. No evidence for pulmonary edema  IMPRESSION: Stable chest radiograph findings. Left basilar densities may represent a combination of atelectasis and small effusion.   Electronically Signed   By: Markus Daft M.D.   On: 09/20/2013 07:59     Assessment/Plan: S/P Procedure(s) (LRB): EXPLORATION POST OPERATIVE OPEN HEART (N/A)  1.CV-He went into a fib with RVR earlier this am.Remains in SR in the 90's this am. On Lopressor 25 bid and Amiodarone gttp. Likely stop drip and start oral Amiodarone this am. 2.Pulmonary-CXR this am appears to show improved aeration,no pneumothorax, small left pleural effusion, bibasilar atelectasis, cardiomegaly. Encourage incentive spirometer and flutter valve 3.Volume overload-had excellent diuresis yesterday 4.ABL anemia- H and H 8.8 and 27 this am 5.Thrombocytopenia-platelets slightly increased to 127,000 6.CKD (stage III)-Creatinine down to 1.98  this am (baseline is 1.9). Appreciate nephrology's assistance. 7.CBGs 89/98/88.On Levemir 10 units daily. Will decrease again as not eating much.Will check HGA1C. 8.GI- He probably has a mild post op ileus.On clear liquids. Will order Ensure. 9. Will discuss with surgeon timing of removal of foley  ZIMMERMAN,DONIELLE M PA-C 09/21/2013 8:00 AM  Convert amio to po Advance diet PT consult Renal fx baseline and will DC foley

## 2013-09-21 NOTE — Progress Notes (Signed)
Patient ID: Dalton Mitchell, male   DOB: Feb 17, 1946, 67 y.o.   MRN: [id number] S:feels good O:BP 102/68  Pulse 89  Temp(Src) 98.7 F (37.1 C) (Oral)  Resp 20  Ht 5\' 9"  (1.753 m)  Wt 112.583 kg (248 lb 3.2 oz)  BMI 36.64 kg/m2  SpO2 100%  Intake/Output Summary (Last 24 hours) at 09/21/13 1011 Last data filed at 09/21/13 0900  Gross per 24 hour  Intake  472.1 ml  Output   5200 ml  Net -4727.9 ml   Intake/Output: I/O last 3 completed shifts: In: 1251.1 [P.O.:480; I.V.:771.1] Out: 7805 [Urine:7805]  Intake/Output this shift:  Total I/O In: 60 [I.V.:60] Out: 200 [Urine:200] Weight change: -3.717 kg (-8 lb 3.1 oz) Gen:WD WN WM in NAD CVS:no rub Resp:cta [id number] Ext:no edema   Recent Labs Lab 09/16/13 0415 09/17/13 0430  09/17/13 1747  09/17/13 2345 09/18/13 0400 09/18/13 1615 09/18/13 1619 09/19/13 0415 09/19/13 1700 09/20/13 0400 09/21/13 0400  NA 141 141  < > 140  < > 140 139  --  141 140 136 136 139  138  K 4.4 4.0  < > 4.3  < > 4.0 4.6  --  4.1 4.5 4.1 4.1 4.1  4.0  CL 108 106  --  106  --   --  108  --  103 104 101 100 101  101  CO2 23 23  --   --   --   --  25  --   --  28 27 29  32  31  GLUCOSE 91 126*  < > 134*  < > 125* 120*  --  149* 124* 124* 113* 86  90  BUN 31* 32*  --  28*  --   --  24*  --  25* 30* 33* 34* 31*  30*  CREATININE 1.94* 1.92*  --  1.70*  < >  --  1.63* 1.86* 1.90* 2.18* 2.09* 2.19* 1.99*  1.98*  ALBUMIN  --   --   --   --   --   --   --   --   --   --   --   --  3.1*  3.2*  CALCIUM 9.5 9.6  --   --   --   --  8.5  --   --  9.0 8.4 8.7 8.7  8.5  PHOS  --   --   --   --   --   --   --   --   --   --   --   --  3.0  AST  --   --   --   --   --   --   --   --   --   --   --   --  26  ALT  --   --   --   --   --   --   --   --   --   --   --   --  15  < > = values in this interval not displayed. Liver Function Tests:  Recent Labs Lab 09/21/13 0400  AST 26  ALT 15  ALKPHOS 33*  BILITOT 0.3  PROT 6.0  ALBUMIN 3.1*   3.2*   No results found for this basename: LIPASE, AMYLASE,  in the last 168 hours No results found for this basename: AMMONIA,  in the last 168 hours CBC:  Recent Labs Lab 09/18/13 0400 09/18/13  1615  09/19/13 0415 09/20/13 0400 09/21/13 0400  WBC 13.5* 14.5*  --  14.6* 8.7 5.1  HGB 11.2* 10.7*  < > 9.9* 9.0* 8.8*  HCT 33.2* 30.8*  < > 29.9* 26.4* 27.0*  MCV 94.9 93.9  --  95.5 95.3 96.4  PLT 102* 106*  --  107* 95* 127*  < > = values in this interval not displayed. Cardiac Enzymes: No results found for this basename: CKTOTAL, CKMB, CKMBINDEX, TROPONINI,  in the last 168 hours CBG:  Recent Labs Lab 09/20/13 0830 09/20/13 1214 09/20/13 1643 09/20/13 2203 09/21/13 0812  GLUCAP 99 89 98 88 87    Iron Studies: No results found for this basename: IRON, TIBC, TRANSFERRIN, FERRITIN,  in the last 72 hours Studies/Results: Dg Chest Port 1 View  09/21/2013   CLINICAL DATA:  Status post CABG.  EXAM: PORTABLE CHEST - 1 VIEW  COMPARISON:  09/20/2013  FINDINGS: Heart size and mediastinal contours are stable. Lung expansion improved bilaterally with some residual bibasilar atelectasis remaining. No edema or significant pleural fluid is identified. No pneumothorax is seen.  IMPRESSION: Improved pulmonary aeration.  No pneumothorax.   Electronically Signed   By: Aletta Edouard M.D.   On: 09/21/2013 07:58   Dg Chest Port 1 View  09/20/2013   CLINICAL DATA:  Post cardiac surgery.  EXAM: PORTABLE CHEST - 1 VIEW  COMPARISON:  09/19/2013  FINDINGS: Mild elevation of the right hemidiaphragm is unchanged. Stable enlargement of the cardiac silhouette. Stable position of the right jugular central venous catheter. Patient continues to have low lung volumes and densities at the left costophrenic angle. Left basilar densities could represent atelectasis and a small amount of pleural fluid. No evidence for pulmonary edema  IMPRESSION: Stable chest radiograph findings. Left basilar densities may  represent a combination of atelectasis and small effusion.   Electronically Signed   By: Markus Daft M.D.   On: 09/20/2013 07:59   . acetaminophen  1,000 mg Oral Q6H   Or  . acetaminophen (TYLENOL) oral liquid 160 mg/5 mL  1,000 mg Per Tube Q6H  . allopurinol  300 mg Oral Daily  . amiodarone  400 mg Oral Q12H   Followed by  . [START ON 09/29/2013] amiodarone  400 mg Oral Daily  . aspirin EC  325 mg Oral Daily   Or  . aspirin  324 mg Per Tube Daily  . atorvastatin  10 mg Oral QHS  . bisacodyl  10 mg Oral Daily   Or  . bisacodyl  10 mg Rectal Daily  . docusate sodium  200 mg Oral Daily  . enoxaparin (LOVENOX) injection  30 mg Subcutaneous Q24H  . feeding supplement (ENSURE COMPLETE)  237 mL Oral TID WC  . fluticasone  2 spray Each Nare QHS  . insulin aspart  0-9 Units Subcutaneous TID WC  . insulin detemir  4 Units Subcutaneous Daily  . insulin regular  0-10 Units Intravenous TID WC  . levothyroxine  75 mcg Oral QAC breakfast  . metoprolol tartrate  25 mg Oral BID   Or  . metoprolol tartrate  25 mg Per Tube BID  . pantoprazole  40 mg Oral Daily  . sodium chloride  3 mL Intravenous Q12H    BMET    Component Value Date/Time   NA 139 09/21/2013 0400   NA 138 09/21/2013 0400   K 4.1 09/21/2013 0400   K 4.0 09/21/2013 0400   CL 101 09/21/2013 0400   CL 101 09/21/2013 0400  CO2 32 09/21/2013 0400   CO2 31 09/21/2013 0400   GLUCOSE 86 09/21/2013 0400   GLUCOSE 90 09/21/2013 0400   BUN 31* 09/21/2013 0400   BUN 30* 09/21/2013 0400   CREATININE 1.99* 09/21/2013 0400   CREATININE 1.98* 09/21/2013 0400   CALCIUM 8.7 09/21/2013 0400   CALCIUM 8.5 09/21/2013 0400   GFRNONAA 33* 09/21/2013 0400   GFRNONAA 33* 09/21/2013 0400   GFRAA 39* 09/21/2013 0400   GFRAA 39* 09/21/2013 0400   CBC    Component Value Date/Time   WBC 5.1 09/21/2013 0400   RBC 2.80* 09/21/2013 0400   HGB 8.8* 09/21/2013 0400   HCT 27.0* 09/21/2013 0400   PLT 127* 09/21/2013 0400   MCV 96.4 09/21/2013  0400   MCH 31.4 09/21/2013 0400   MCHC 32.6 09/21/2013 0400   RDW 13.5 09/21/2013 0400   LYMPHSABS 1.6 09/13/2013 1040   MONOABS 0.8 09/13/2013 1040   EOSABS 0.1 09/13/2013 1040   BASOSABS 0.0 09/13/2013 1040     Assessment/Plan:  1. AKI/CKD stage 3 (baseline Scr has been 1.8)- good UOP post-operatively. Currently off of pressors (including renal dose dopamine) and did well with holding diuretic.  Continues to auto-diurese c/w post ATN recovery..  1. Continue to hold lasix given auto-diuresis   2. Follow UOP and daily Scr (FeNa >1% c/w ATN) and will need to prevent volume depletion 3. Cont to hold ARB  4. May benefit from blood transfusion given sig drop in H/H 2. CAD s/p CABG x 4. Doing well. Plan per CT surgery 3. ABLA- may benefit from blood transfusion if he continues to drop 4. Hyperglycemia- per protocol 5. HTN- stable 6. Hypothyroidism- now on home dose of levothyroxine. TSH Was low. Will cont to follow.  7. Dispo- recommend removing foley and follow UOP and prn bladder scan  Insiya Oshea A

## 2013-09-21 NOTE — Progress Notes (Signed)
Subjective:  He c/o blurred vision on looking to the left.  Mild lethargic and mild abdominal fullness. C/o worsening dyspnea and having hallucinations.  Family at bedside and anxious.  Objective:  Vital Signs in the last 24 hours: BP 100/65  Pulse 111  Temp(Src) 97.8 F (36.6 C) (Oral)  Resp 20  Ht 5\' 9"  (1.753 m)  Wt 112.583 kg (248 lb 3.2 oz)  BMI 36.64 kg/m2  SpO2 96%  Physical Exam: Pale WM in NAD Lungs:  Reduced BS Cardiac:  Regular rhythm, normal S1 and S2, no S3 Abdomen:  Soft, nontender, no masses Extremities:  1+ edema present  Intake/Output from previous day: 12/18 0701 - 12/19 0700 In: 731.9 [P.O.:240; I.V.:491.9] Out: 6100 [Urine:6100] Weight Filed Weights   09/19/13 0600 09/20/13 0314 09/21/13 0500  Weight: 116.302 kg (256 lb 6.4 oz) 116.3 kg (256 lb 6.3 oz) 112.583 kg (248 lb 3.2 oz)    Lab Results: Basic Metabolic Panel:  Recent Labs  09/20/13 0400 09/21/13 0400  NA 136 139  138  K 4.1 4.1  4.0  CL 100 101  101  CO2 29 32  31  GLUCOSE 113* 86  90  BUN 34* 31*  30*  CREATININE 2.19* 1.99*  1.98*    CBC:  Recent Labs  09/20/13 0400 09/21/13 0400  WBC 8.7 5.1  HGB 9.0* 8.8*  HCT 26.4* 27.0*  MCV 95.3 96.4  PLT 95* 127*   Telemetry: Currently atrial fib with RVR  Assessment/Plan:  1. Stable post recent CABG 2. Renal function improved post d/c renal dose dopamine 3. Post op volume excess  4. Blurred vision will need to monitor 5. Recurrent atrial fibrillation 6. Hallucinations  Rec:  Restart amiodarone drip and will need to load orally once stable.     Kerry Hough  MD [facility] Card[facility]  09/21/2013, 1:55 PM

## 2013-09-21 NOTE — Progress Notes (Signed)
  Amiodarone Drug - Drug Interaction Consult Note  Recommendations: No immediate changes required.  Amiodarone is metabolized by the cytochrome P450 system and therefore has the potential to cause many drug interactions. Amiodarone has an average plasma half-life of 50 days (range 20 to 100 days).   There is potential for drug interactions to occur several weeks or months after stopping treatment and the onset of drug interactions may be slow after initiating amiodarone.   [x]  Statins: Increased risk of myopathy. Simvastatin- restrict dose to 20mg  daily. Other statins: counsel patients to report any muscle pain or weakness immediately.  []  Anticoagulants: Amiodarone can increase anticoagulant effect. Consider warfarin dose reduction. Patients should be monitored closely and the dose of anticoagulant altered accordingly, remembering that amiodarone levels take several weeks to stabilize.  []  Antiepileptics: Amiodarone can increase plasma concentration of phenytoin, the dose should be reduced. Note that small changes in phenytoin dose can result in large changes in levels. Monitor patient and counsel on signs of toxicity.  [x]  Beta blockers: increased risk of bradycardia, AV block and myocardial depression. Sotalol - avoid concomitant use.  []   Calcium channel blockers (diltiazem and verapamil): increased risk of bradycardia, AV block and myocardial depression.  []   Cyclosporine: Amiodarone increases levels of cyclosporine. Reduced dose of cyclosporine is recommended.  []  Digoxin dose should be halved when amiodarone is started.  []  Diuretics: increased risk of cardiotoxicity if hypokalemia occurs.  []  Oral hypoglycemic agents (glyburide, glipizide, glimepiride): increased risk of hypoglycemia. Patient's glucose levels should be monitored closely when initiating amiodarone therapy.   []  Drugs that prolong the QT interval:  Torsades de pointes risk may be increased with concurrent use -  avoid if possible.  Monitor QTc, also keep magnesium/potassium WNL if concurrent therapy can't be avoided. [name] Antibiotics: e.g. fluoroquinolones, erythromycin. . Antiarrhythmics: e.g. quinidine, procainamide, disopyramide, sotalol. . Antipsychotics: e.g. phenothiazines, haloperidol.  . Lithium, tricyclic antidepressants, and methadone.   Thank You,  Wynona Neat, PharmD, BCPS  09/21/2013 6:22 AM

## 2013-09-22 ENCOUNTER — Inpatient Hospital Stay (HOSPITAL_COMMUNITY): Payer: Medicare Other

## 2013-09-22 LAB — RENAL FUNCTION PANEL
CO2: 31 mEq/L (ref 19–32)
Chloride: 102 mEq/L (ref 96–112)
GFR calc Af Amer: 44 mL/min — ABNORMAL LOW (ref >90)
GFR calc non Af Amer: 38 mL/min — ABNORMAL LOW (ref >90)
Glucose, Bld: 99 mg/dL (ref 70–99)
Potassium: 3.5 mEq/L (ref 3.5–5.1)
Sodium: 141 mEq/L (ref 135–145)

## 2013-09-22 LAB — CBC
HCT: 27.2 % — ABNORMAL LOW (ref 39.0–52.0)
Hemoglobin: 9 g/dL — ABNORMAL LOW (ref 13.0–17.0)
MCH: 31.6 pg (ref 26.0–34.0)
MCHC: 33.1 g/dL (ref 30.0–36.0)
MCV: 95.4 fL (ref 78.0–100.0)
Platelets: 147 10*3/uL — ABNORMAL LOW (ref 150–400)
RBC: 2.85 MIL/uL — ABNORMAL LOW (ref 4.22–5.81)
RDW: 13.3 % (ref 11.5–15.5)
WBC: 4.1 10*3/uL (ref 4.0–10.5)

## 2013-09-22 LAB — TSH: TSH: 6.929 u[IU]/mL — ABNORMAL HIGH (ref 0.350–4.500)

## 2013-09-22 LAB — GLUCOSE, CAPILLARY
Glucose-Capillary: 98 mg/dL (ref 70–99)
Glucose-Capillary: 115 mg/dL — ABNORMAL HIGH (ref 70–99)

## 2013-09-22 LAB — MAGNESIUM: Magnesium: 2.4 mg/dL (ref 1.5–2.5)

## 2013-09-22 MED ORDER — ONDANSETRON HCL 4 MG/2ML IJ SOLN: 4.0000 mg | Freq: Four times a day (QID) | INTRAMUSCULAR | Status: DC | PRN

## 2013-09-22 MED ORDER — ACETAMINOPHEN 325 MG PO TABS: 650.0000 mg | ORAL_TABLET | Freq: Four times a day (QID) | ORAL | Status: DC | PRN

## 2013-09-22 MED ORDER — MOVING RIGHT ALONG BOOK
Freq: Once | Status: AC
  Administered 2013-09-22: 18:00:00
  Filled 2013-09-22: qty 1

## 2013-09-22 MED ORDER — ASPIRIN EC 325 MG PO TBEC
325.0000 mg | DELAYED_RELEASE_TABLET | Freq: Every day | ORAL | Status: DC
  Administered 2013-09-23 – 2013-09-25 (×3): 325 mg via ORAL
  Filled 2013-09-22 (×4): qty 1

## 2013-09-22 MED ORDER — METOPROLOL TARTRATE 25 MG PO TABS
25.0000 mg | ORAL_TABLET | Freq: Two times a day (BID) | ORAL | Status: DC
  Administered 2013-09-22 – 2013-09-25 (×6): 25 mg via ORAL
  Filled 2013-09-22 (×8): qty 1

## 2013-09-22 MED ORDER — POTASSIUM CHLORIDE 10 MEQ/50ML IV SOLN
10.0000 meq | INTRAVENOUS | Status: AC | PRN
  Administered 2013-09-22 (×3): 10 meq via INTRAVENOUS

## 2013-09-22 MED ORDER — FAMOTIDINE 20 MG PO TABS
20.0000 mg | ORAL_TABLET | Freq: Two times a day (BID) | ORAL | Status: DC
  Administered 2013-09-22 – 2013-09-25 (×6): 20 mg via ORAL
  Filled 2013-09-22 (×8): qty 1

## 2013-09-22 MED ORDER — ONDANSETRON HCL 4 MG PO TABS: 4.0000 mg | ORAL_TABLET | Freq: Four times a day (QID) | ORAL | Status: DC | PRN

## 2013-09-22 MED ORDER — SODIUM CHLORIDE 0.9 % IJ SOLN
3.0000 mL | Freq: Two times a day (BID) | INTRAMUSCULAR | Status: DC
  Administered 2013-09-23 – 2013-09-24 (×3): 3 mL via INTRAVENOUS

## 2013-09-22 MED ORDER — SODIUM CHLORIDE 0.9 % IV SOLN: 250.0000 mL | INTRAVENOUS | Status: DC | PRN

## 2013-09-22 MED ORDER — TRAMADOL HCL 50 MG PO TABS
50.0000 mg | ORAL_TABLET | ORAL | Status: DC | PRN
  Administered 2013-09-22 – 2013-09-23 (×2): 50 mg via ORAL
  Administered 2013-09-23 – 2013-09-24 (×4): 100 mg via ORAL
  Filled 2013-09-22: qty 1
  Filled 2013-09-22 (×2): qty 2
  Filled 2013-09-22: qty 1
  Filled 2013-09-22 (×3): qty 2

## 2013-09-22 MED ORDER — SODIUM CHLORIDE 0.9 % IJ SOLN: 3.0000 mL | INTRAMUSCULAR | Status: DC | PRN

## 2013-09-22 NOTE — Plan of Care (Signed)
Problem: Phase III Progression Outcomes Goal: Time patient transferred to PCTU/Telemetry POD Outcome: Completed/Met Date Met:  09/22/13 1346

## 2013-09-22 NOTE — Progress Notes (Signed)
09/22/2013 6:31 PM Nursing note Moving right along book given to patient and contents reviewed. Questions encouraged and addressed.  Mekiah Wahler, Arville Lime

## 2013-09-22 NOTE — Progress Notes (Signed)
Patient ID: Dalton Mitchell, male   DOB: [birth year], 67 y.o.   MRN: [id number] S:feels better but still reports visual hallucinations O:BP 117/81  Pulse 73  Temp(Src) 97.6 F (36.4 C) (Oral)  Resp 14  Ht 5\' 9"  (1.753 m)  Wt 112.5 kg (248 lb 0.3 oz)  BMI 36.61 kg/m2  SpO2 95%  Intake/Output Summary (Last 24 hours) at 09/22/13 0816 Last data filed at 09/22/13 0645  Gross per 24 hour  Intake   1431 ml  Output   2880 ml  Net  -1449 ml   Intake/Output: I/O last 3 completed shifts: In: [id number] [P.O.:1131; I.V.:460; IV Piggyback:100] Out: 5880 [Urine:5880]  Intake/Output this shift:    Weight change: -0.083 kg (-2.9 oz) Gen:WD WN WM in NAD CVS:IRR IRR Resp:cta [id number] edema   Recent Labs Lab 09/17/13 0430  09/18/13 0400 09/18/13 1615 09/18/13 1619 09/19/13 0415 09/19/13 1700 09/20/13 0400 09/21/13 0400 09/22/13 0500  NA 141  < > 139  --  141 140 136 136 139  138 141  K 4.0  < > 4.6  --  4.1 4.5 4.1 4.1 4.1  4.0 3.5  CL 106  < > 108  --  103 104 101 100 101  101 102  CO2 23  --  25  --   --  28 27 29  32  31 31  GLUCOSE 126*  < > 120*  --  149* 124* 124* 113* 86  90 99  BUN 32*  < > 24*  --  25* 30* 33* 34* 31*  30* 27*  CREATININE 1.92*  < > 1.63* 1.86* 1.90* 2.18* 2.09* 2.19* 1.99*  1.98* 1.80*  ALBUMIN  --   --   --   --   --   --   --   --  3.1*  3.2* 3.1*  CALCIUM 9.6  --  8.5  --   --  9.0 8.4 8.7 8.7  8.5 8.7  PHOS  --   --   --   --   --   --   --   --  3.0 2.8  AST  --   --   --   --   --   --   --   --  26  --   ALT  --   --   --   --   --   --   --   --  15  --   < > = values in this interval not displayed. Liver Function Tests:  Recent Labs Lab 09/21/13 0400 09/22/13 0500  AST 26  --   ALT 15  --   ALKPHOS 33*  --   BILITOT 0.3  --   PROT 6.0  --   ALBUMIN 3.1*  3.2* 3.1*   No results found for this basename: LIPASE, AMYLASE,  in the last 168 hours No results found for this basename: AMMONIA,  in the last 168  hours CBC:  Recent Labs Lab 09/18/13 1615  09/19/13 0415 09/20/13 0400 09/21/13 0400 09/22/13 0500  WBC 14.5*  --  14.6* 8.7 5.1 4.1  HGB 10.7*  < > 9.9* 9.0* 8.8* 9.0*  HCT 30.8*  < > 29.9* 26.4* 27.0* 27.2*  MCV 93.9  --  95.5 95.3 96.4 95.4  PLT 106*  --  107* 95* 127* 147*  < > = values in this interval not displayed. Cardiac Enzymes: No results found for this basename: CKTOTAL, CKMB, CKMBINDEX, TROPONINI,  in the last 168 hours CBG:  Recent Labs Lab 09/21/13 0812 09/21/13 1212 09/21/13 1606 09/21/13 2224 09/22/13 0115  GLUCAP 87 124* 116* 103* 98    Iron Studies: No results found for this basename: IRON, TIBC, TRANSFERRIN, FERRITIN,  in the last 72 hours Studies/Results: Dg Chest 2 View  09/22/2013   CLINICAL DATA:  Coronary artery bypass  EXAM: CHEST  2 VIEW  COMPARISON:  Yesterday  FINDINGS: Right internal jugular vein introducer is stable. Pacemaker wires project over the upper abdomen and cardiac silhouette. Low volumes. Mild bibasilar linear atelectasis. No pneumothorax. No sign of pulmonary edema. Tiny pleural effusions.  IMPRESSION: No evidence of CHF for pneumothorax. Tiny pleural effusions persist.   Electronically Signed   By: Maryclare Bean M.D.   On: 09/22/2013 07:55   Dg Chest Port 1 View  09/21/2013   CLINICAL DATA:  Status post CABG.  EXAM: PORTABLE CHEST - 1 VIEW  COMPARISON:  09/20/2013  FINDINGS: Heart size and mediastinal contours are stable. Lung expansion improved bilaterally with some residual bibasilar atelectasis remaining. No edema or significant pleural fluid is identified. No pneumothorax is seen.  IMPRESSION: Improved pulmonary aeration.  No pneumothorax.   Electronically Signed   By: Aletta Edouard M.D.   On: 09/21/2013 07:58   . acetaminophen  1,000 mg Oral Q6H   Or  . acetaminophen (TYLENOL) oral liquid 160 mg/5 mL  1,000 mg Per Tube Q6H  . allopurinol  300 mg Oral Daily  . amiodarone  400 mg Oral TID  . aspirin EC  325 mg Oral Daily   Or   . aspirin  324 mg Per Tube Daily  . atorvastatin  10 mg Oral QHS  . bisacodyl  10 mg Oral Daily   Or  . bisacodyl  10 mg Rectal Daily  . cefUROXime (ZINACEF)  IV  1.5 g Intravenous To OR  . docusate sodium  200 mg Oral Daily  . enoxaparin (LOVENOX) injection  30 mg Subcutaneous Q24H  . feeding supplement (ENSURE COMPLETE)  237 mL Oral TID WC  . fluticasone  2 spray Each Nare QHS  . insulin aspart  0-9 Units Subcutaneous TID WC  . insulin detemir  4 Units Subcutaneous Daily  . levothyroxine  75 mcg Oral QAC breakfast  . metoprolol tartrate  25 mg Oral BID   Or  . metoprolol tartrate  25 mg Per Tube BID  . pantoprazole  40 mg Oral Daily  . sodium chloride  3 mL Intravenous Q12H    BMET    Component Value Date/Time   NA 141 09/22/2013 0500   K 3.5 09/22/2013 0500   CL 102 09/22/2013 0500   CO2 31 09/22/2013 0500   GLUCOSE 99 09/22/2013 0500   BUN 27* 09/22/2013 0500   CREATININE 1.80* 09/22/2013 0500   CALCIUM 8.7 09/22/2013 0500   GFRNONAA 38* 09/22/2013 0500   GFRAA 44* 09/22/2013 0500   CBC    Component Value Date/Time   WBC 4.1 09/22/2013 0500   RBC 2.85* 09/22/2013 0500   HGB 9.0* 09/22/2013 0500   HCT 27.2* 09/22/2013 0500   PLT 147* 09/22/2013 0500   MCV 95.4 09/22/2013 0500   MCH 31.6 09/22/2013 0500   MCHC 33.1 09/22/2013 0500   RDW 13.3 09/22/2013 0500   LYMPHSABS 1.6 09/13/2013 1040   MONOABS 0.8 09/13/2013 1040   EOSABS 0.1 09/13/2013 1040   BASOSABS 0.0 09/13/2013 1040     Assessment/Plan:  1. AKI/CKD stage 3 (baseline Scr has  been 1.8)- good UOP post-operatively. Currently off of pressors (including renal dose dopamine) and did well with holding diuretic. Continues to auto-diurese c/w post ATN recovery.  Scr now back at baseline.  1. Continue to hold lasix given auto-diuresis  2. Follow UOP and daily Scr (FeNa >1% c/w ATN) and will need to prevent volume depletion 3. Cont to hold ARB  4. May benefit from blood transfusion given sig drop in H/H  per primary svc. 2. CAD s/p CABG x 4. Doing well. Plan per CT surgery 3. ABLA- may benefit from blood transfusion if he continues to drop 4. A Fib- on amio per Cardiology 5. Hyperglycemia- per protocol 6. Hallucinations- visual and likely related to meds, but unclear which.    7. HTN- stable 8. Hypothyroidism- now on home dose of levothyroxine. TSH Was low. Will cont to follow.  9. Dispo- Foley out and doing well.  If decrease in UOP check bladder scan.  Will need outpt follow up with Dr. Justin Mend when stable for d/c 10. Will see again on Monday.  Call with questions or concerns   Macaila Tahir A

## 2013-09-22 NOTE — Progress Notes (Signed)
5 Days Post-Op Procedure(s) (LRB): EXPLORATION POST OPERATIVE OPEN HEART (N/A) Subjective:  No complaints Bowels working Objective: Vital signs in last 24 hours: Temp:  [97.6 F (36.4 C)-98.5 F (36.9 C)] 97.6 F (36.4 C) (12/20 0738) Pulse Rate:  [72-133] 73 (12/20 0600) Cardiac Rhythm:  [-] Normal sinus rhythm (12/19 2000) Resp:  [14-27] 14 (12/20 0600) BP: (75-133)/(23-81) 117/81 mmHg (12/20 0600) SpO2:  [90 %-97 %] 95 % (12/20 0600) Weight:  [112.5 kg (248 lb 0.3 oz)] 112.5 kg (248 lb 0.3 oz) (12/20 0400)  No further A-fib since yesterday  Hemodynamic parameters for last 24 hours:    Intake/Output from previous day: 12/19 0701 - 12/20 0700 In: [id number] [P.O.:1131; I.V.:240; IV Piggyback:100] Out: 3080 [Urine:3080] Intake/Output this shift:    General appearance: alert and cooperative Neurologic: intact Heart: regular rate and rhythm, S1, S2 normal, no murmur, click, rub or gallop Lungs: clear to auscultation bilaterally Abdomen: soft, non-tender; bowel sounds normal; no masses,  no organomegaly Extremities: edema mild Wound: incisions ok  Lab Results:  Recent Labs  09/21/13 0400 09/22/13 0500  WBC 5.1 4.1  HGB 8.8* 9.0*  HCT 27.0* 27.2*  PLT 127* 147*   BMET:  Recent Labs  09/21/13 0400 09/22/13 0500  NA 139  138 141  K 4.1  4.0 3.5  CL 101  101 102  CO2 32  31 31  GLUCOSE 86  90 99  BUN 31*  30* 27*  CREATININE 1.99*  1.98* 1.80*  CALCIUM 8.7  8.5 8.7    PT/INR: No results found for this basename: LABPROT, INR,  in the last 72 hours ABG    Component Value Date/Time   PHART 7.309* 09/18/2013 0932   HCO3 23.6 09/18/2013 0932   TCO2 25 09/18/2013 1619   ACIDBASEDEF 2.0 09/18/2013 0932   O2SAT 93.0 09/18/2013 0932   CBG (last 3)   Recent Labs  09/21/13 2224 09/22/13 0115 09/22/13 0736  GLUCAP 103* 98 99   CLINICAL DATA: Coronary artery bypass  EXAM:  CHEST 2 VIEW  COMPARISON: Yesterday  FINDINGS:  Right internal jugular vein  introducer is stable. Pacemaker wires  project over the upper abdomen and cardiac silhouette. Low volumes.  Mild bibasilar linear atelectasis. No pneumothorax. No sign of  pulmonary edema. Tiny pleural effusions.  IMPRESSION:  No evidence of CHF for pneumothorax. Tiny pleural effusions persist.  Electronically Signed  By: Maryclare Bean M.D.  On: 09/22/2013 07:55  Assessment/Plan:  S/P CABG  Hemodynamically stable  Postop A-fib: maintaining sinus on PO amio. Received 2 IV boluses yesterday.  Chronic kidney disease with baseline creat about 1.8. He has diuresed well and is at baseline creat.  Transfer to 2S and continue mobilization, diuresis, IS.   Gaye Pollack 09/22/2013

## 2013-09-23 DIAGNOSIS — Z951 Presence of aortocoronary bypass graft: Secondary | ICD-10-CM

## 2013-09-23 LAB — BASIC METABOLIC PANEL
BUN: 24 mg/dL — ABNORMAL HIGH (ref 6–23)
Calcium: 8.6 mg/dL (ref 8.4–10.5)
Chloride: 99 mEq/L (ref 96–112)
Creatinine, Ser: 1.77 mg/dL — ABNORMAL HIGH (ref 0.50–1.35)
GFR calc Af Amer: 44 mL/min — ABNORMAL LOW (ref >90)
Potassium: 3.9 mEq/L (ref 3.5–5.1)

## 2013-09-23 LAB — GLUCOSE, CAPILLARY: Glucose-Capillary: 98 mg/dL (ref 70–99)

## 2013-09-23 MED ORDER — TAMSULOSIN HCL 0.4 MG PO CAPS
0.4000 mg | ORAL_CAPSULE | Freq: Every day | ORAL | Status: DC
  Administered 2013-09-23 – 2013-09-24 (×2): 0.4 mg via ORAL
  Filled 2013-09-23 (×3): qty 1

## 2013-09-23 NOTE — Discharge Summary (Signed)
Physician Discharge Summary  Patient ID: Dalton Mitchell MRN: [id number] DOB/AGE: Nov 10, 1945 67 y.o.  Admit date: 09/13/2013 Discharge date: 09/23/2013  Admission Diagnoses:  Patient Active Problem List   Diagnosis Date Noted  . Unstable angina 09/13/2013  . Obesity (BMI 30-39.9)   . Old inferior wall myocardial infarction   . Sleep apnea   . GERD 02/24/2008  . Hypothyroidism   . Hyperlipidemia   . Gout   . Hypertensive heart disease   . CAD   . Chronic kidney disease (CKD), stage IV (severe)   . History of kidney stones    Discharge Diagnoses:   Patient Active Problem List   Diagnosis Date Noted  . 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  . Unstable angina 09/13/2013  . Obesity (BMI 30-39.9)   . Old inferior wall myocardial infarction   . Sleep apnea   . GERD 02/24/2008  . Hypothyroidism   . Hyperlipidemia   . Gout   . Hypertensive heart disease   . CAD   . Chronic kidney disease (CKD), stage IV (severe)   . History of kidney stones    Discharged Condition: good  History of Present Illness:   Dalton Mitchell is a 67 yo morbidly obese white male with known history of CAD.  He is S/P MI with PTCA and stent placement to his RCA in 1995.  Since that time the patient was doing well.  However, about a month ago the patient developed chest pain while moving his trash can to the street.  He was unable to describe the pain, but states it was as severe and consistent with the chest pain he experienced during his previous MI.  The pain initially resolved, but then later recurred several times throughout the night.  The next morning the patient developed another episode which was more severe than his prior ones.  He took and Aspirin and presented to the Emergency Department for evaluation.  EKG was unremarkable and cardiac enzymes were negative.  However, patient was admitted for further observation.     Hospital Course:   During hospital admission patient underwent cardiac catheterization  which showed 3 vessel CAD and a preserved EF of 50-55%.  It was felt the patient would benefit from Coronary bypass procedure and TCTS was consulted.  The patient was evaluated by Dr. Roxan Hockey who was in agreement that surgery would be a good treatment option for this patient.  The risks and benefits of the Coronary Bypass procedure were explained to the patient and he was agreeable to proceed.  The patient remained chest pain free during the admission.  He was taken to the operating room on 09/17/2013.  He underwent CABG x 4 utilizing LIMA to LAD, SVG to OM2, and a sequential SVG to PDA and distal RCA.  He also underwent Endoscopic saphenous vein harvesting from his right leg.  He tolerated the procedure well and was taken to the SICU in stable condition.  The patient had high chest tube output and was taken back to the operating room that evening for exploration for post operative bleeding.  He again tolerated the procedure without difficulty and was taken to the SICU in stable condition.  During his stay in the ICU the patient was extubated the morning after surgery.  Patient has CKD and was evaluated by Nephrology post operatively.  He had good urinary output post operatively and his creatinine has returned to baseline level.  He was weaned of phenylephrine and Dopamine as tolerated.  He was  volume overloaded and diuresed with Lasix.  The patient developed rapid Atrial Fibrillation.  He was subsequently treated with Amiodarone with successful conversion to NSR.  His chest tubes and arterial lines were removed without difficulty.  Once medically stable he was transferred to the step down unit in stable condition.  The patient is making progress.  He continues to maintain NSR and his pacing wires were removed without difficulty.  He continues to have good urinary output and creatinine remains at baseline level.  The patient did develop some difficulty post foley catheter removal with getting his urine started.   He also would have some intermittent stops during the urination process.  He was placed on Flomax which provided some relief.  Urinalysis was obtained and we will contact patient if results are positive for UTI.  He is ambulating with assistance.  He is tolerating a heart healthy diet.  He is medically stable at this time.  Should no further issues arise we anticipate discharge home in the next 24-48 hours.  He will follow up with Dr. Roxan Hockey in 3 weeks with a CXR prior to his appointment.  He will also need to follow up with Dr. Wynonia Lawman in 2-4 weeks.       Significant Diagnostic Studies: angiography:   HEMODYNAMICS: Aorta post contrast 138/80, left ventricle post contrast.138/2-16. There was no gradient between the left ventricle and aorta.   ANGIOGRAPHIC DATA:   CORONARY ARTERIES: Arise and distribute normally. Right dominant. Moderate coronary calcification is noted.  Left main coronary artery: Mild narrowing  Left anterior descending: Moderately calcified. There is a tubular 60-80% stenosis in the proximal portion at the septal perforator with some eccentric narrowing noted. Mild distal disease is noted.  Circumflex coronary artery: Previous stent is seen and is widely patent. There is a eccentric 95% stenosis in a large marginal branch  Right coronary artery: Subtotal stenosis proximally and then is totally occluded and fills by collaterals from the left coronary system. Appears to be a dominant vessel.   LEFT VENTRICULOGRAM: Not performed due to renal insufficiency  Treatments: surgery:   Median sternotomy, extracorporeal circulation, coronary artery bypass grafting x4 (left internal mammary artery to left anterior descending, saphenous vein graft to obtuse marginal 2, sequential saphenous vein graft to posterior descending and distal right coronary).  Disposition: 01-Home or Self Care  Discharge Medications:     Medication List    STOP taking these medications       amLODipine  5 MG tablet  Commonly known as:  NORVASC     losartan 50 MG tablet  Commonly known as:  COZAAR      TAKE these medications       allopurinol 300 MG tablet  Commonly known as:  ZYLOPRIM  Take 300 mg by mouth daily.     ALPRAZolam 0.5 MG tablet  Commonly known as:  XANAX  Take 0.5 mg by mouth at bedtime as needed for sleep.     amiodarone 200 MG tablet  Commonly known as:  PACERONE  Take 2 tablets (400 mg total) by mouth 2 (two) times daily. For 7 Days, Then decrease to 200 mg twice daily for 7 days, then take 200 mg daily     aspirin 325 MG EC tablet  Take 1 tablet (325 mg total) by mouth daily.     atorvastatin 10 MG tablet  Commonly known as:  LIPITOR  Take 10 mg by mouth at bedtime.     famotidine 10 MG chewable tablet  Commonly known as:  PEPCID AC  Chew 10 mg by mouth daily.     fluticasone 50 MCG/ACT nasal spray  Commonly known as:  FLONASE  Place 2 sprays into both nostrils at bedtime.     glucosamine-chondroitin 500-400 MG tablet  Take 1 tablet by mouth 2 (two) times daily.     HYDROcodone-acetaminophen 5-325 MG per tablet  Commonly known as:  NORCO/VICODIN  Take 1 tablet by mouth every 6 (six) hours as needed for moderate pain.     levothyroxine 75 MCG tablet  Commonly known as:  SYNTHROID, LEVOTHROID  Take 75 mcg by mouth daily.     metoprolol tartrate 25 MG tablet  Commonly known as:  LOPRESSOR  Take 1 tablet (25 mg total) by mouth 2 (two) times daily.     multivitamin with minerals tablet  Take 1 tablet by mouth daily.     tamsulosin 0.4 MG Caps capsule  Commonly known as:  FLOMAX  Take 1 capsule (0.4 mg total) by mouth daily after supper.     traMADol 50 MG tablet  Commonly known as:  ULTRAM  Take 1-2 tablets (50-100 mg total) by mouth every 4 (four) hours as needed for moderate pain.     zolpidem 10 MG tablet  Commonly known as:  AMBIEN  Take 10 mg by mouth at bedtime.       The patient has been discharged on:   1.Beta Blocker:  Yes  [  x ]                              No   [   ]                              If No, reason:  2.Ace Inhibitor/ARB: Yes [   ]                                     No  [   x ]                                     If No, reason: Elevated creatinine, intolerance due to cough 3.Statin:   Yes [ x  ]                  No  [   ]                  If No, reason:  4.Ecasa:  Yes  [ x  ]                  No   [   ]                  If No, reason:         Follow-up Information   Follow up with HENDRICKSON,STEVEN C, MD In 3 weeks. (office will contact you with appointment)    Specialty:  Cardiothoracic Surgery   Contact information:   [address] [place] [address] [phone]       Follow up with [facility] In 3 weeks. (Please get CXR 1 hour prior to appointment with Dr. Roxan Hockey)  Contact information:   [place]       Follow up with TILLEY JR,W SPENCER, MD. (Please contact office to set up appointment)    Specialty:  Cardiology   Contact information:   [address] [place] [address] [phone]       Signed: Ellwood Handler 09/23/2013, 9:51 AM

## 2013-09-23 NOTE — Progress Notes (Signed)
09/23/2013 1630 Nursing note Pt. Voiced concern to RN about difficulty with maintaining good urine flow while voiding. Pt. Denies discomfort while voiding and states he feels he is emptying his bladder completely but that the flow starts and stops suddenly. Junie Panning Barrett PAC paged and made aware. Orders received to send UA and start Flomax 0.4mg  QPM. Orders enacted and patient updated on plan. Will continue to monitor patient.  Jasdeep Dejarnett, Arville Lime

## 2013-09-23 NOTE — Progress Notes (Signed)
Pt is at his baseline Scr.  Doing well and nothing further to add.  Will sign off.  Pt to follow up with Dr. Justin Mend from our office once stable for discharge. Cont to hold ACE until seen as an outpt.

## 2013-09-23 NOTE — Progress Notes (Addendum)
      [address]       ,[place] [address]             [phone]      6 Days Post-Op Procedure(s) (LRB): EXPLORATION POST OPERATIVE OPEN HEART (N/A)  Subjective:  Dalton Mitchell complains of feeling weak. + Ambulation with assistance + BM  Objective: Vital signs in last 24 hours: Temp:  [97.8 F (36.6 C)-98.5 F (36.9 C)] 98 F (36.7 C) (12/21 0448) Pulse Rate:  [72-86] 73 (12/21 0448) Cardiac Rhythm:  [-] Normal sinus rhythm (12/21 0751) Resp:  [12-18] 18 (12/21 0448) BP: (114-136)/(66-85) 115/70 mmHg (12/21 0448) SpO2:  [95 %-98 %] 96 % (12/21 0448) Weight:  [242 lb 4.6 oz (109.9 kg)] 242 lb 4.6 oz (109.9 kg) (12/21 0448) Intake/Output from previous day: 12/20 0701 - 12/21 0700 In: 1010 [P.O.:960; IV Piggyback:50] Out: 3625 [Urine:3625] Intake/Output this shift: Total I/O In: -  Out: 500 [Urine:500]  General appearance: alert, cooperative and no distress Heart: regular rate and rhythm Lungs: clear to auscultation bilaterally Abdomen: soft, non-tender; bowel sounds normal; no masses,  no organomegaly Extremities: edema trace Wound: clean and ry  Lab Results:  Recent Labs  09/21/13 0400 09/22/13 0500  WBC 5.1 4.1  HGB 8.8* 9.0*  HCT 27.0* 27.2*  PLT 127* 147*   BMET:  Recent Labs  09/22/13 0500 09/23/13 0457  NA 141 138  K 3.5 3.9  CL 102 99  CO2 31 31  GLUCOSE 99 101*  BUN 27* 24*  CREATININE 1.80* 1.77*  CALCIUM 8.7 8.6    PT/INR: No results found for this basename: LABPROT, INR,  in the last 72 hours ABG    Component Value Date/Time   PHART 7.309* 09/18/2013 0932   HCO3 23.6 09/18/2013 0932   TCO2 25 09/18/2013 1619   ACIDBASEDEF 2.0 09/18/2013 0932   O2SAT 93.0 09/18/2013 0932   CBG (last 3)   Recent Labs  09/22/13 0115 09/22/13 0736 09/22/13 1135  GLUCAP 98 99 115*    Assessment/Plan: S/P Procedure(s) (LRB): EXPLORATION POST OPERATIVE OPEN HEART (N/A)  1. CV- Previous A. Fib, currently NSR- continue  Amiodarone, Lopressor 2. Pulm- no acute issues, encouraged IS 3. Renal- CKD creatinine at baseline, weight is stable 4. Deconditioning- ambulate 5. Dispo- patient stable, maintaining NSR will d/c EPW, possibly d/c in next 24-48 hrs   LOS: 10 days    Dalton Mitchell 09/23/2013   Chart reviewed, patient examined, agree with above. He looks good overall and is maintaining sinus on amio tid for loading. Wt. Is below preop and creat stable. He should be ready to go home tomorrow if he has a good day today.

## 2013-09-23 NOTE — Progress Notes (Signed)
09/23/2013 1350 Nursing note EPW d/c per orders and per protocol. Ends intact. Pt. Tolerated well. Vital signs collected per protocol. Pt. Advised of bedrest for one hour post removal. Call bell within reach. Will continue to monitor patient.  Dejon Lukas, Arville Lime

## 2013-09-23 NOTE — Progress Notes (Signed)
09/23/2013 5:18 PM Nursing note Pt. Ambulated 150 ft with RW, RN and on RA. Pt. Tolerated well. Oxygen saturation spot checked during walk at 98%. Encouraged one more walk this evening.  Mattheu Brodersen, Arville Lime

## 2013-09-23 NOTE — Progress Notes (Signed)
09/23/2013 10:53 AM Offered pt. Walk this am. Pt. Politely declined. Encouraged ambulation x 3 today. Will attempt to walk patient later in shift.  Andriy Sherk, Arville Lime

## 2013-09-24 LAB — URINALYSIS, ROUTINE W REFLEX MICROSCOPIC
Bilirubin Urine: NEGATIVE
Leukocytes, UA: NEGATIVE
Nitrite: NEGATIVE
Protein, ur: 30 mg/dL — AB
Specific Gravity, Urine: 1.007 (ref 1.005–1.030)
Urobilinogen, UA: 1 mg/dL (ref 0.0–1.0)

## 2013-09-24 LAB — URINE MICROSCOPIC-ADD ON

## 2013-09-24 MED ORDER — ASPIRIN 325 MG PO TBEC: 325.0000 mg | DELAYED_RELEASE_TABLET | Freq: Every day | ORAL | Status: DC

## 2013-09-24 MED ORDER — METOPROLOL TARTRATE 25 MG PO TABS: 25.0000 mg | ORAL_TABLET | Freq: Two times a day (BID) | ORAL | Status: DC

## 2013-09-24 MED ORDER — POLYETHYLENE GLYCOL 3350 17 G PO PACK
17.0000 g | PACK | Freq: Once | ORAL | Status: AC
  Administered 2013-09-24: 17 g via ORAL
  Filled 2013-09-24: qty 1

## 2013-09-24 MED ORDER — TAMSULOSIN HCL 0.4 MG PO CAPS: 0.4000 mg | ORAL_CAPSULE | Freq: Every day | ORAL | Status: DC

## 2013-09-24 MED ORDER — AMIODARONE HCL 200 MG PO TABS
400.0000 mg | ORAL_TABLET | Freq: Two times a day (BID) | ORAL | Status: DC
  Administered 2013-09-24 – 2013-09-25 (×3): 400 mg via ORAL
  Filled 2013-09-24 (×4): qty 2

## 2013-09-24 MED ORDER — TRAMADOL HCL 50 MG PO TABS: 50.0000 mg | ORAL_TABLET | ORAL | Status: DC | PRN

## 2013-09-24 MED ORDER — AMIODARONE HCL 200 MG PO TABS: 400.0000 mg | ORAL_TABLET | Freq: Two times a day (BID) | ORAL | Status: DC

## 2013-09-24 NOTE — Progress Notes (Addendum)
      [address]       [place], [address]             [phone]      7 Days Post-Op Procedure(s) (LRB): EXPLORATION POST OPERATIVE OPEN HEART (N/A)  Subjective:  Mr. Langenbach is feeling much better this morning.  He is ready to be discharged.  + BM  Objective: Vital signs in last 24 hours: Temp:  [98.5 F (36.9 C)] 98.5 F (36.9 C) (12/22 0401) Pulse Rate:  [70-83] 73 (12/22 0401) Cardiac Rhythm:  [-] Normal sinus rhythm (12/22 0810) Resp:  [18] 18 (12/22 0401) BP: (109-123)/(68-76) 116/72 mmHg (12/22 0401) SpO2:  [94 %-95 %] 94 % (12/22 0401) Weight:  [241 lb 6.4 oz (109.498 kg)] 241 lb 6.4 oz (109.498 kg) (12/22 0401)  Intake/Output from previous day: 12/21 0701 - 12/22 0700 In: 480 [P.O.:480] Out: 1650 [Urine:1650] Intake/Output this shift: Total I/O In: -  Out: 800 [Urine:800]  General appearance: alert, cooperative and no distress Heart: regular rate and rhythm Lungs: clear to auscultation bilaterally Abdomen: soft, non-tender; bowel sounds normal; no masses,  no organomegaly Extremities: edema trace Wound: clean and dry  Lab Results:  Recent Labs  09/22/13 0500  WBC 4.1  HGB 9.0*  HCT 27.2*  PLT 147*   BMET:  Recent Labs  09/22/13 0500 09/23/13 0457  NA 141 138  K 3.5 3.9  CL 102 99  CO2 31 31  GLUCOSE 99 101*  BUN 27* 24*  CREATININE 1.80* 1.77*  CALCIUM 8.7 8.6    PT/INR: No results found for this basename: LABPROT, INR,  in the last 72 hours ABG    Component Value Date/Time   PHART 7.309* 09/18/2013 0932   HCO3 23.6 09/18/2013 0932   TCO2 25 09/18/2013 1619   ACIDBASEDEF 2.0 09/18/2013 0932   O2SAT 93.0 09/18/2013 0932   CBG (last 3)   Recent Labs  09/22/13 0736 09/22/13 1135 09/23/13 1119  GLUCAP 99 115* 98    Assessment/Plan: S/P Procedure(s) (LRB): EXPLORATION POST OPERATIVE OPEN HEART (N/A)  1. CV- NSR continue Amiodarone, Lopressor 2. Pulm- no acute issues, encouraged IS 3. Renal-  creatinine remains at baseline, weight is below baseline 4. GU- patient with difficulty in starting urinary flow, added Flomax yesterday, patient feels helps, UA pending 5. Dispo- patient doing well, will d/c home today   LOS: 11 days    BARRETT, ERIN 09/24/2013  He has multiple complaints currently.   He was having hallucinations early on but they have resolved.  He still complains of "blurry vision," but says it is improving. He describes this as difficulty focusing and mild diplopia.  His pupils are equal and reactive and EOM intact.   He has not had a BM in 3 days- will give miralax this AM  Also c/o food tasting funny and his tongue being numb. That also is improving, but not back to normal yet.  Will keep one more day to make sure these issues continue to resolve

## 2013-09-24 NOTE — Progress Notes (Signed)
Subjective:  He is feeling a lot better. On floor.  Still no BM.  Still c/o blurred vision when watching TV.  Dyspnea better.   Objective:  Vital Signs in the last 24 hours: BP 116/72  Pulse 73  Temp(Src) 98.5 F (36.9 C) (Oral)  Resp 18  Ht 5\' 9"  (1.753 m)  Wt 109.498 kg (241 lb 6.4 oz)  BMI 35.63 kg/m2  SpO2 94%  Physical Exam: Pale WM in NAD Lungs:  Reduced BS Cardiac:  Regular rhythm, normal S1 and S2, no S3 Abdomen:  Soft, nontender, no masses Extremities:  1+ edema present  Intake/Output from previous day: 12/21 0701 - 12/22 0700 In: 480 [P.O.:480] Out: 1650 [Urine:1650] Weight Filed Weights   09/22/13 0400 09/23/13 0448 09/24/13 0401  Weight: 112.5 kg (248 lb 0.3 oz) 109.9 kg (242 lb 4.6 oz) 109.498 kg (241 lb 6.4 oz)    Lab Results: Basic Metabolic Panel:  Recent Labs  09/22/13 0500 09/23/13 0457  NA 141 138  K 3.5 3.9  CL 102 99  CO2 31 31  GLUCOSE 99 101*  BUN 27* 24*  CREATININE 1.80* 1.77*    CBC:  Recent Labs  09/22/13 0500  WBC 4.1  HGB 9.0*  HCT 27.2*  MCV 95.4  PLT 147*   Telemetry: sinus  Assessment/Plan:  1. Stable post recent CABG 2. Renal function improved\ 3. Post op volume excess  improving 4. Blurred vision will need to monitor 5. Recurrent atrial fibrillation, maintaining NSR  Rec:  He is better.  Hopefully d/c in am.  I will need to see in f/u in 2 weeks.    Kerry Hough  MD [facility]  09/24/2013, 9:50 AM

## 2013-09-24 NOTE — Progress Notes (Signed)
CARDIAC REHAB PHASE I   PRE:  Rate/Rhythm: 81SR  BP:  Supine: 128/80  Sitting:   Standing:    SaO2: 95%RA  MODE:  Ambulation: 500 ft   POST:  Rate/Rhythm: 87SR  BP:  Supine:   Sitting: 110/80  Standing:    SaO2: 97%RA 0919-1000 Pt walked 500 ft on RA with rolling walker with slow pace. Stopped a couple of times to rest. Pt to check with wife to see if walker has wheels at home.  To sitting on side of bed after walk. Tolerated increase in distance well.    Graylon Good, RN BSN  09/24/2013 9:55 AM

## 2013-09-24 NOTE — Progress Notes (Signed)
Patient ambulated 300 ft in hallway using rolling walker with NT. Returned to room w/out incidence. Call bell near.Dalton Mitchell

## 2013-09-25 NOTE — Progress Notes (Signed)
Subjective:  Feeling better.  Not SOB , no chest pain.    Objective:  Vital Signs in the last 24 hours: BP 112/81  Pulse 75  Temp(Src) 98.8 F (37.1 C) (Oral)  Resp 18  Ht 5\' 9"  (1.753 m)  Wt 107.7 kg (237 lb 7 oz)  BMI 35.05 kg/m2  SpO2 95%  Physical Exam: Pale WM in NAD Lungs:  Reduced BS Cardiac:  Regular rhythm, normal S1 and S2, no S3 Abdomen:  Soft, nontender, no masses Extremities:  1+ edema present  Intake/Output from previous day: 12/22 0701 - 12/23 0700 In: 480 [P.O.:480] Out: 1650 [Urine:1650] Weight Filed Weights   09/23/13 0448 09/24/13 0401 09/25/13 0457  Weight: 109.9 kg (242 lb 4.6 oz) 109.498 kg (241 lb 6.4 oz) 107.7 kg (237 lb 7 oz)    Lab Results: Basic Metabolic Panel:  Recent Labs  09/23/13 0457  NA 138  K 3.9  CL 99  CO2 31  GLUCOSE 101*  BUN 24*  CREATININE 1.77*    Telemetry: Sinus, no more a fib  Assessment/Plan:  1. Stable post recent CABG 2. Renal function stable 3. Post op volume excess  improving 4. Blurred vision will need to monitor 5. Recurrent atrial fibrillation, maintaining NSR  Rec:  D/c today, followup with me in 2 weeks.    Kerry Hough  MD [facility]  09/25/2013, 8:53 AM

## 2013-09-25 NOTE — Progress Notes (Addendum)
      [address]       [place] [address]             [phone]      8 Days Post-Op Procedure(s) (LRB): EXPLORATION POST OPERATIVE OPEN HEART (N/A)  Subjective:  Mr. Jeck has no new complaints this morning.  He is no longer experiencing hallucinations.  He feels his "blurry" vision has also improved.  He continues to have some numbness in his tongue, which is also slowly improving. + BM  Objective: Vital signs in last 24 hours: Temp:  [97.7 F (36.5 C)-98.8 F (37.1 C)] 98.8 F (37.1 C) (12/23 0457) Pulse Rate:  [72-83] 75 (12/23 0457) Cardiac Rhythm:  [-] Normal sinus rhythm (12/22 2030) Resp:  [18] 18 (12/23 0457) BP: (111-114)/(60-81) 112/81 mmHg (12/23 0457) SpO2:  [95 %-97 %] 95 % (12/23 0457) Weight:  [237 lb 7 oz (107.7 kg)] 237 lb 7 oz (107.7 kg) (12/23 0457)  Intake/Output from previous day: 12/22 0701 - 12/23 0700 In: 480 [P.O.:480] Out: 1650 [Urine:1650]  General appearance: alert, cooperative and no distress Heart: regular rate and rhythm Lungs: clear to auscultation bilaterally Abdomen: soft, non-tender; bowel sounds normal; no masses,  no organomegaly Extremities: edema trace Wound: clean and dry  Lab Results: No results found for this basename: WBC, HGB, HCT, PLT,  in the last 72 hours BMET:  Recent Labs  09/23/13 0457  NA 138  K 3.9  CL 99  CO2 31  GLUCOSE 101*  BUN 24*  CREATININE 1.77*  CALCIUM 8.6    PT/INR: No results found for this basename: LABPROT, INR,  in the last 72 hours ABG    Component Value Date/Time   PHART 7.309* 09/18/2013 0932   HCO3 23.6 09/18/2013 0932   TCO2 25 09/18/2013 1619   ACIDBASEDEF 2.0 09/18/2013 0932   O2SAT 93.0 09/18/2013 0932   CBG (last 3)   Recent Labs  09/22/13 1135 09/23/13 1119  GLUCAP 115* 98    Assessment/Plan: S/P Procedure(s) (LRB): EXPLORATION POST OPERATIVE OPEN HEART (N/A)  1. CV- NSR will taper Amiodarone, continue Lopressor 2. Pulm- no acute issues,  encouraged IS 3. Renal- weight is stable 4. Neuro- no further hallucinations after discontinuation of Narcotic 5. GI- LOC constipation resolved 6. Dispo- patient is stable, will d/c home today   LOS: 12 days    BARRETT, ERIN 09/25/2013  Patient seen and examined, agree with above Home today

## 2013-09-25 NOTE — Progress Notes (Signed)
[id number] Education completed with pt and wife. Understanding voiced. Pt declined CRP 2 due to high copay. Left brochure in case anything changes. Encouraged IS. Graylon Good RN BSN 09/25/2013 10:49 AM

## 2013-10-02 NOTE — Op Note (Signed)
NAMEMarland Kitchen  Dalton Mitchell, Dalton Mitchell NO.:  [id number]  MEDICAL RECORD NO.:  [id number]  LOCATION:  2W10C                        FACILITY:  [facility]  PHYSICIAN:  Revonda Standard. Roxan Hockey, M.D.DATE OF BIRTH:  November 16, 1945  DATE OF PROCEDURE:  09/17/2013 DATE OF DISCHARGE:  09/25/2013                              OPERATIVE REPORT   PREOPERATIVE DIAGNOSIS:  Bleeding following coronary artery bypass grafting.  POSTOPERATIVE DIAGNOSIS:  Bleeding following coronary artery bypass grafting.  PROCEDURE:  Mediastinal reexploration for bleeding.  SURGEON:  Revonda Standard. Roxan Hockey, M.D.  ASSISTANT:  Ara Kussmaul, RNFA.  ANESTHESIA:  General.  FINDINGS:  Hematoma in the anterior mediastinum, minimal blood and clot within the pericardium, likely bleeding source was from sternal wires on the right side.  Multiple other small bleeding vessels were noted.  CLINICAL NOTE:  Dalton Mitchell is a 67 year old gentleman who earlier on September 17, 2013, had undergone coronary artery bypass grafting x4. Initially in the ICU, he was stable with no signs of bleeding. However, after being back approximately 6 hours, he began to have increased output from his chest tubes. This was primarily from the anterior mediastinal tube.  Despite attempts to correct coagulopathy, the bleeding persisted. His family was advised that he should be taken back to the operating room for re-exploration to control the bleeding.  The indications, risks, benefits, and alternatives were discussed with the family, they gave consent.  The OR was notified and the patient was transported back to the operating room.  Dalton Mitchell arrived in the operating room on September 17, 2013, already intubated.  General anesthesia was again induced.  The dressings were removed and the chest, abdomen, and groins were prepped and draped in usual sterile fashion.  The incision was reopened, suture material was removed.  The wires were cut and  removed.  A retractor was placed and inspection of the mediastinum revealed moderate amount of clot within the anterior mediastinum and blood in the anterior mediastinal chest tube.  There were moderate pleural effusions as well.  There was only a small amount of blood and clot within the pericardium. The pericardium had been closed during the initial operation.  The hematoma was evacuated from the anterior mediastinum and the chest tube.  The pericardium was reopened. A systematic inspection was undertaken.  There were multiple small bleeding vessels that were cauterized or clipped as necessary.  There was no bleeding from the proximal anastomoses.  There was no bleeding from the aortic or atrial cannulation sites.  There was minimal blood within the pericardium and no bleeding from behind the heart.  The retractor was removed.  Each side of the sternum was elevated, there was some bleeding from the third intercostal space on the right side, this was controlled with a heavy Vicryl suture.  There was no bleeding from the internal mammary bed on the left.  The chest was copiously irrigated with warm saline.  The pericardium was reapproximated over the ascending aorta.  A final inspection was made for hemostasis and no other bleeding sites could be identified.  The sternum was once again closed with interrupted heavy gauge stainless steel wires. The pectoralis fascia, subcutaneous tissue and skin were closed in standard fashion.  The patient was transported from the operating room back to the surgical intensive care unit in good condition.     Revonda Standard Roxan Hockey, M.D.     SCH/MEDQ  D:  10/01/2013  T:  10/02/2013  Job:  [id number]

## 2013-10-11 ENCOUNTER — Other Ambulatory Visit: Payer: Self-pay | Admitting: *Deleted

## 2013-10-11 DIAGNOSIS — I2510 Atherosclerotic heart disease of native coronary artery without angina pectoris: Secondary | ICD-10-CM

## 2013-10-16 ENCOUNTER — Ambulatory Visit
Admission: RE | Admit: 2013-10-16 | Discharge: 2013-10-16 | Disposition: A | Discharge status: Home / Self Care | Payer: Medicare Other | Source: Ambulatory Visit | Attending: Thoracic Surgery (Cardiothoracic Vascular Surgery) | Admitting: Thoracic Surgery (Cardiothoracic Vascular Surgery)

## 2013-10-16 ENCOUNTER — Ambulatory Visit (INDEPENDENT_AMBULATORY_CARE_PROVIDER_SITE_OTHER): Payer: Self-pay | Admitting: Thoracic Surgery (Cardiothoracic Vascular Surgery)

## 2013-10-16 ENCOUNTER — Encounter: Payer: Self-pay | Admitting: Thoracic Surgery (Cardiothoracic Vascular Surgery)

## 2013-10-16 VITALS — BP 110/79 | HR 73 | Resp 16 | Ht 69.0 in | Wt 233.0 lb

## 2013-10-16 DIAGNOSIS — I2510 Atherosclerotic heart disease of native coronary artery without angina pectoris: Secondary | ICD-10-CM

## 2013-10-16 DIAGNOSIS — Z951 Presence of aortocoronary bypass graft: Secondary | ICD-10-CM

## 2013-10-16 NOTE — Progress Notes (Signed)
HPI:  Dalton Mitchell is a 68 year old gentleman who underwent coronary bypass grafting x4 on December 15. After initially being back in the ICU for about 6 hours he had suddenly developed mediastinal bleeding which required reexploration. We will bleeding site was around the sternal wires on the right side. Following that he had  atrial fibrillation and was started on amiodarone. He converted to sinus rhythm. He did not require anticoagulation. His course was otherwise unremarkable.  He says that he has been having frequent coughing. This is typically a dry cough. It has been getting better but is not completely resolved. He has not tried to take anything for that. His wife noted that his lips which had a purplish color for about 8-10 years were pink her initially after surgery, but now he regained a more purple color. He has some incisional discomfort but says that it is not bad. He has no angina, leg swelling, or shortness of breath.  Past Medical History  Diagnosis Date  . Adenomatous colon polyp   . Old inferior wall myocardial infarction   . Sleep apnea   . Hypothyroidism   . Hyperlipidemia   . Gout   . Hypertensive heart disease   . CAD     Prior inferior MI treated with stent 1995    . GERD 02/24/2008  . 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 History of kidney stones   . Obesity (BMI 30-39.9)       Current Outpatient Prescriptions  Medication Sig Dispense Refill  . allopurinol (ZYLOPRIM) 300 MG tablet Take 300 mg by mouth daily.        [name] ALPRAZolam (XANAX) 0.5 MG tablet Take 0.5 mg by mouth at bedtime as needed for sleep.      [name] amiodarone (PACERONE) 200 MG tablet Take 200 mg by mouth daily.      [name] aspirin EC 325 MG EC tablet Take 1 tablet (325 mg total) by mouth daily.  30 tablet  0  . atorvastatin (LIPITOR) 10 MG tablet Take 10 mg by mouth at bedtime.      . famotidine (PEPCID AC) 10 MG chewable tablet Chew 10 mg by mouth daily.      . fluticasone (FLONASE)  50 MCG/ACT nasal spray Place 2 sprays into both nostrils at bedtime.      [name] glucosamine-chondroitin 500-400 MG tablet Take 1 tablet by mouth 2 (two) times daily.      [name] HYDROcodone-acetaminophen (NORCO/VICODIN) 5-325 MG per tablet Take 1 tablet by mouth every 6 (six) hours as needed for moderate pain.      [name] levothyroxine (SYNTHROID, LEVOTHROID) 75 MCG tablet Take 75 mcg by mouth daily.        . metoprolol tartrate (LOPRESSOR) 25 MG tablet Take 1 tablet (25 mg total) by mouth 2 (two) times daily.  60 tablet  3  . Multiple Vitamins-Minerals (MULTIVITAMIN WITH MINERALS) tablet Take 1 tablet by mouth daily.        . tamsulosin (FLOMAX) 0.4 MG CAPS capsule Take 1 capsule (0.4 mg total) by mouth daily after supper.  30 capsule  3  . zolpidem (AMBIEN) 10 MG tablet Take 10 mg by mouth at bedtime.       [name] losartan (COZAAR) 50 MG tablet        No current facility-administered medications for this visit.    Physical Exam BP 110/79  Pulse 73  Resp 16  Ht 5\' 9"  (1.753 m)  Wt 233 lb (105.688  kg)  BMI 34.39 kg/m2  SpO38 50% 68 year old male in no acute distress Alert and oriented x3 with no neurologic deficits Cardiac regular rate and rhythm normal S1 and S2 no rubs Lungs clear with equal breath sounds bilaterally Sternum stable, incision healing well Leg incisions healing well, no peripheral edema  Diagnostic Tests: Chest x-ray 10/16/2013 CHEST 2 VIEW  COMPARISON: 09/22/2013  FINDINGS:  Heart size upper normal. Negative for heart failure. Pleural fluid  has resolved since the prior study. No effusion today. No infiltrate  or atelectasis.  IMPRESSION:  Resolution of left pleural effusion. The lungs are clear.  Electronically Signed  By: Franchot Gallo M.D.  On: 10/16/2013 10:02  Impression: 68 year old gentleman who is now a month post coronary bypass grafting complicated by postoperative bleeding and atrial fibrillation. He is doing extremely well at this point in time. His exercise  tolerance is good and continues to improve. He has been having a lot of coughing. Her certainly nothing on chest x-ray to explain that. I think he can use any over-the-counter cough medication for that, he should just clear it with his pharmacist to make sure there is no medication interactions.  He did have atrial fibrillation postoperatively and was discharged home on amiodarone. I think he should probably stay on that for another 2-4 weeks. I will defer to Dr. Wynonia Lawman to make the final decision on the length of treatment.  He should not lift any over 10 pounds for another 2 weeks and should avoid any heavy upper body exertion for another 4 weeks. He may begin driving. Appropriate precautions were discussed.  Plan: Dr. Wynonia Lawman will follow him for his cardiology needs.  I will be happy to see him back any time if I can be of any further assistance with his care.

## 2013-11-28 DIAGNOSIS — M5412 Radiculopathy, cervical region: Secondary | ICD-10-CM | POA: Diagnosis not present

## 2013-11-28 DIAGNOSIS — M47812 Spondylosis without myelopathy or radiculopathy, cervical region: Secondary | ICD-10-CM | POA: Diagnosis not present

## 2013-11-28 DIAGNOSIS — M542 Cervicalgia: Secondary | ICD-10-CM | POA: Diagnosis not present

## 2013-11-28 DIAGNOSIS — M5030 Other cervical disc degeneration, unspecified cervical region: Secondary | ICD-10-CM | POA: Diagnosis not present

## 2013-11-30 DIAGNOSIS — M47812 Spondylosis without myelopathy or radiculopathy, cervical region: Secondary | ICD-10-CM | POA: Diagnosis not present

## 2013-11-30 DIAGNOSIS — M5030 Other cervical disc degeneration, unspecified cervical region: Secondary | ICD-10-CM | POA: Diagnosis not present

## 2013-11-30 DIAGNOSIS — M5412 Radiculopathy, cervical region: Secondary | ICD-10-CM | POA: Diagnosis not present

## 2013-11-30 DIAGNOSIS — M542 Cervicalgia: Secondary | ICD-10-CM | POA: Diagnosis not present

## 2013-12-03 DIAGNOSIS — M47812 Spondylosis without myelopathy or radiculopathy, cervical region: Secondary | ICD-10-CM | POA: Diagnosis not present

## 2013-12-03 DIAGNOSIS — M542 Cervicalgia: Secondary | ICD-10-CM | POA: Diagnosis not present

## 2013-12-03 DIAGNOSIS — M5412 Radiculopathy, cervical region: Secondary | ICD-10-CM | POA: Diagnosis not present

## 2013-12-03 DIAGNOSIS — M5030 Other cervical disc degeneration, unspecified cervical region: Secondary | ICD-10-CM | POA: Diagnosis not present

## 2013-12-05 DIAGNOSIS — E785 Hyperlipidemia, unspecified: Secondary | ICD-10-CM | POA: Diagnosis not present

## 2013-12-05 DIAGNOSIS — M542 Cervicalgia: Secondary | ICD-10-CM | POA: Diagnosis not present

## 2013-12-05 DIAGNOSIS — M5030 Other cervical disc degeneration, unspecified cervical region: Secondary | ICD-10-CM | POA: Diagnosis not present

## 2013-12-05 DIAGNOSIS — I2510 Atherosclerotic heart disease of native coronary artery without angina pectoris: Secondary | ICD-10-CM | POA: Diagnosis not present

## 2013-12-05 DIAGNOSIS — I10 Essential (primary) hypertension: Secondary | ICD-10-CM | POA: Diagnosis not present

## 2013-12-05 DIAGNOSIS — M5412 Radiculopathy, cervical region: Secondary | ICD-10-CM | POA: Diagnosis not present

## 2013-12-05 DIAGNOSIS — Z23 Encounter for immunization: Secondary | ICD-10-CM | POA: Diagnosis not present

## 2013-12-05 DIAGNOSIS — M47812 Spondylosis without myelopathy or radiculopathy, cervical region: Secondary | ICD-10-CM | POA: Diagnosis not present

## 2013-12-05 DIAGNOSIS — Z1331 Encounter for screening for depression: Secondary | ICD-10-CM | POA: Diagnosis not present

## 2013-12-05 DIAGNOSIS — R7301 Impaired fasting glucose: Secondary | ICD-10-CM | POA: Diagnosis not present

## 2013-12-05 DIAGNOSIS — N183 Chronic kidney disease, stage 3 unspecified: Secondary | ICD-10-CM | POA: Diagnosis not present

## 2013-12-07 DIAGNOSIS — M5030 Other cervical disc degeneration, unspecified cervical region: Secondary | ICD-10-CM | POA: Diagnosis not present

## 2013-12-07 DIAGNOSIS — M47812 Spondylosis without myelopathy or radiculopathy, cervical region: Secondary | ICD-10-CM | POA: Diagnosis not present

## 2013-12-07 DIAGNOSIS — M5412 Radiculopathy, cervical region: Secondary | ICD-10-CM | POA: Diagnosis not present

## 2013-12-07 DIAGNOSIS — M542 Cervicalgia: Secondary | ICD-10-CM | POA: Diagnosis not present

## 2013-12-11 DIAGNOSIS — M47812 Spondylosis without myelopathy or radiculopathy, cervical region: Secondary | ICD-10-CM | POA: Diagnosis not present

## 2013-12-11 DIAGNOSIS — M5412 Radiculopathy, cervical region: Secondary | ICD-10-CM | POA: Diagnosis not present

## 2013-12-11 DIAGNOSIS — M5030 Other cervical disc degeneration, unspecified cervical region: Secondary | ICD-10-CM | POA: Diagnosis not present

## 2013-12-11 DIAGNOSIS — M542 Cervicalgia: Secondary | ICD-10-CM | POA: Diagnosis not present

## 2013-12-13 DIAGNOSIS — M5030 Other cervical disc degeneration, unspecified cervical region: Secondary | ICD-10-CM | POA: Diagnosis not present

## 2013-12-13 DIAGNOSIS — M47812 Spondylosis without myelopathy or radiculopathy, cervical region: Secondary | ICD-10-CM | POA: Diagnosis not present

## 2013-12-13 DIAGNOSIS — M5412 Radiculopathy, cervical region: Secondary | ICD-10-CM | POA: Diagnosis not present

## 2013-12-13 DIAGNOSIS — M542 Cervicalgia: Secondary | ICD-10-CM | POA: Diagnosis not present

## 2013-12-18 DIAGNOSIS — M5412 Radiculopathy, cervical region: Secondary | ICD-10-CM | POA: Diagnosis not present

## 2013-12-18 DIAGNOSIS — M5030 Other cervical disc degeneration, unspecified cervical region: Secondary | ICD-10-CM | POA: Diagnosis not present

## 2013-12-18 DIAGNOSIS — M47812 Spondylosis without myelopathy or radiculopathy, cervical region: Secondary | ICD-10-CM | POA: Diagnosis not present

## 2013-12-18 DIAGNOSIS — M542 Cervicalgia: Secondary | ICD-10-CM | POA: Diagnosis not present

## 2013-12-20 DIAGNOSIS — M5412 Radiculopathy, cervical region: Secondary | ICD-10-CM | POA: Diagnosis not present

## 2013-12-20 DIAGNOSIS — M47812 Spondylosis without myelopathy or radiculopathy, cervical region: Secondary | ICD-10-CM | POA: Diagnosis not present

## 2013-12-20 DIAGNOSIS — M5030 Other cervical disc degeneration, unspecified cervical region: Secondary | ICD-10-CM | POA: Diagnosis not present

## 2013-12-20 DIAGNOSIS — M542 Cervicalgia: Secondary | ICD-10-CM | POA: Diagnosis not present

## 2013-12-27 DIAGNOSIS — M542 Cervicalgia: Secondary | ICD-10-CM | POA: Diagnosis not present

## 2013-12-27 DIAGNOSIS — M5030 Other cervical disc degeneration, unspecified cervical region: Secondary | ICD-10-CM | POA: Diagnosis not present

## 2013-12-27 DIAGNOSIS — M5412 Radiculopathy, cervical region: Secondary | ICD-10-CM | POA: Diagnosis not present

## 2013-12-27 DIAGNOSIS — M47812 Spondylosis without myelopathy or radiculopathy, cervical region: Secondary | ICD-10-CM | POA: Diagnosis not present

## 2014-01-02 DIAGNOSIS — M542 Cervicalgia: Secondary | ICD-10-CM | POA: Diagnosis not present

## 2014-01-02 DIAGNOSIS — M5030 Other cervical disc degeneration, unspecified cervical region: Secondary | ICD-10-CM | POA: Diagnosis not present

## 2014-01-02 DIAGNOSIS — M5412 Radiculopathy, cervical region: Secondary | ICD-10-CM | POA: Diagnosis not present

## 2014-01-02 DIAGNOSIS — M47812 Spondylosis without myelopathy or radiculopathy, cervical region: Secondary | ICD-10-CM | POA: Diagnosis not present

## 2014-01-10 ENCOUNTER — Emergency Department (HOSPITAL_COMMUNITY)
Admission: EM | Admit: 2014-01-10 | Discharge: 2014-01-10 | Disposition: A | Discharge status: Home / Self Care | Payer: Medicare Other | Attending: Emergency Medicine | Admitting: Emergency Medicine

## 2014-01-10 ENCOUNTER — Encounter (HOSPITAL_COMMUNITY): Payer: Self-pay | Admitting: Emergency Medicine

## 2014-01-10 ENCOUNTER — Emergency Department (HOSPITAL_COMMUNITY): Payer: Medicare Other

## 2014-01-10 DIAGNOSIS — N289 Disorder of kidney and ureter, unspecified: Secondary | ICD-10-CM

## 2014-01-10 DIAGNOSIS — Z6839 Body mass index (BMI) 39.0-39.9, adult: Secondary | ICD-10-CM | POA: Diagnosis not present

## 2014-01-10 DIAGNOSIS — I1310 Hypertensive heart and chronic kidney disease without heart failure, with stage 1 through stage 4 chronic kidney disease, or unspecified chronic kidney disease: Secondary | ICD-10-CM | POA: Insufficient documentation

## 2014-01-10 DIAGNOSIS — I252 Old myocardial infarction: Secondary | ICD-10-CM | POA: Insufficient documentation

## 2014-01-10 DIAGNOSIS — N1330 Unspecified hydronephrosis: Secondary | ICD-10-CM | POA: Diagnosis not present

## 2014-01-10 DIAGNOSIS — E785 Hyperlipidemia, unspecified: Secondary | ICD-10-CM | POA: Insufficient documentation

## 2014-01-10 DIAGNOSIS — Z860100 Personal history of colon polyps, unspecified: Secondary | ICD-10-CM | POA: Insufficient documentation

## 2014-01-10 DIAGNOSIS — Z87891 Personal history of nicotine dependence: Secondary | ICD-10-CM | POA: Diagnosis not present

## 2014-01-10 DIAGNOSIS — E669 Obesity, unspecified: Secondary | ICD-10-CM | POA: Diagnosis not present

## 2014-01-10 DIAGNOSIS — Z79899 Other long term (current) drug therapy: Secondary | ICD-10-CM | POA: Diagnosis not present

## 2014-01-10 DIAGNOSIS — Z951 Presence of aortocoronary bypass graft: Secondary | ICD-10-CM | POA: Insufficient documentation

## 2014-01-10 DIAGNOSIS — K219 Gastro-esophageal reflux disease without esophagitis: Secondary | ICD-10-CM | POA: Diagnosis not present

## 2014-01-10 DIAGNOSIS — N200 Calculus of kidney: Secondary | ICD-10-CM | POA: Diagnosis not present

## 2014-01-10 DIAGNOSIS — G4730 Sleep apnea, unspecified: Secondary | ICD-10-CM | POA: Diagnosis not present

## 2014-01-10 DIAGNOSIS — I10 Essential (primary) hypertension: Secondary | ICD-10-CM | POA: Diagnosis not present

## 2014-01-10 DIAGNOSIS — K8020 Calculus of gallbladder without cholecystitis without obstruction: Secondary | ICD-10-CM | POA: Diagnosis not present

## 2014-01-10 DIAGNOSIS — N183 Chronic kidney disease, stage 3 unspecified: Secondary | ICD-10-CM | POA: Insufficient documentation

## 2014-01-10 DIAGNOSIS — E039 Hypothyroidism, unspecified: Secondary | ICD-10-CM | POA: Diagnosis not present

## 2014-01-10 DIAGNOSIS — I2510 Atherosclerotic heart disease of native coronary artery without angina pectoris: Secondary | ICD-10-CM | POA: Insufficient documentation

## 2014-01-10 DIAGNOSIS — N201 Calculus of ureter: Secondary | ICD-10-CM | POA: Insufficient documentation

## 2014-01-10 DIAGNOSIS — Z9861 Coronary angioplasty status: Secondary | ICD-10-CM | POA: Insufficient documentation

## 2014-01-10 DIAGNOSIS — Z7982 Long term (current) use of aspirin: Secondary | ICD-10-CM | POA: Diagnosis not present

## 2014-01-10 DIAGNOSIS — M109 Gout, unspecified: Secondary | ICD-10-CM | POA: Insufficient documentation

## 2014-01-10 LAB — COMPREHENSIVE METABOLIC PANEL
ALK PHOS: 66 U/L (ref 39–117)
ALT: 12 U/L (ref 0–53)
AST: 17 U/L (ref 0–37)
Albumin: 3.9 g/dL (ref 3.5–5.2)
BUN: 35 mg/dL — AB (ref 6–23)
CHLORIDE: 102 meq/L (ref 96–112)
CO2: 21 mEq/L (ref 19–32)
CREATININE: 4.26 mg/dL — AB (ref 0.50–1.35)
Calcium: 9.5 mg/dL (ref 8.4–10.5)
GFR, EST AFRICAN AMERICAN: 15 mL/min — AB (ref >90)
GFR, EST NON AFRICAN AMERICAN: 13 mL/min — AB (ref >90)
GLUCOSE: 106 mg/dL — AB (ref 70–99)
POTASSIUM: 5.3 meq/L (ref 3.7–5.3)
Sodium: 140 mEq/L (ref 137–147)
Total Bilirubin: 0.4 mg/dL (ref 0.3–1.2)
Total Protein: 7.8 g/dL (ref 6.0–8.3)

## 2014-01-10 LAB — URINALYSIS, ROUTINE W REFLEX MICROSCOPIC
Bilirubin Urine: NEGATIVE
Glucose, UA: NEGATIVE mg/dL
KETONES UR: NEGATIVE mg/dL
LEUKOCYTES UA: NEGATIVE
Nitrite: NEGATIVE
PH: 6 (ref 5.0–8.0)
Protein, ur: 100 mg/dL — AB
Specific Gravity, Urine: 1.015 (ref 1.005–1.030)
Urobilinogen, UA: 0.2 mg/dL (ref 0.0–1.0)

## 2014-01-10 LAB — CBC WITH DIFFERENTIAL/PLATELET
Basophils Absolute: 0 10*3/uL (ref 0.0–0.1)
Basophils Relative: 0 % (ref 0–1)
Eosinophils Absolute: 0.1 10*3/uL (ref 0.0–0.7)
Eosinophils Relative: 2 % (ref 0–5)
HCT: 42.2 % (ref 39.0–52.0)
Hemoglobin: 13.6 g/dL (ref 13.0–17.0)
LYMPHS PCT: 15 % (ref 12–46)
Lymphs Abs: 1.3 10*3/uL (ref 0.7–4.0)
MCH: 28.2 pg (ref 26.0–34.0)
MCHC: 32.2 g/dL (ref 30.0–36.0)
MCV: 87.4 fL (ref 78.0–100.0)
Monocytes Absolute: 1.1 10*3/uL — ABNORMAL HIGH (ref 0.1–1.0)
Monocytes Relative: 13 % — ABNORMAL HIGH (ref 3–12)
NEUTROS PCT: 70 % (ref 43–77)
Neutro Abs: 6.2 10*3/uL (ref 1.7–7.7)
PLATELETS: 223 10*3/uL (ref 150–400)
RBC: 4.83 MIL/uL (ref 4.22–5.81)
RDW: 15.3 % (ref 11.5–15.5)
WBC: 8.7 10*3/uL (ref 4.0–10.5)

## 2014-01-10 LAB — URINE MICROSCOPIC-ADD ON

## 2014-01-10 MED ORDER — OXYCODONE-ACETAMINOPHEN 5-325 MG PO TABS: 1.0000 | ORAL_TABLET | ORAL | Status: DC | PRN

## 2014-01-10 MED ORDER — TAMSULOSIN HCL 0.4 MG PO CAPS: ORAL_CAPSULE | ORAL | Status: DC

## 2014-01-10 NOTE — Discharge Instructions (Signed)
Hydronephrosis Hydronephrosis is an abnormal enlargement of your kidney. It can affect one or both the kidneys. It results from the backward pressure of urine on the kidneys, when the flow of urine is blocked. Normally, the urine drains from the kidney through the urine tube (ureter), into a sac which holds the urine until urination (bladder). When the urinary flow is blocked, the urine collects above the block. This causes an increase in the pressure inside the kidney, which in turn leads to its enlargement. The block can occur at the point where the kidney joins the ureter. Treatment depends on the cause and location of the block.  CAUSES  The causes of this condition include:  Birth defect of the kidney or ureter.  Kink at the point where the kidney joins the ureter.  Stones and blood clots in the kidney or ureter.  Cancer, injury, or infection of the ureter.  Scar tissue formation.  Backflow of urine (reflux).  Cancer of bladder or prostate gland.  Abnormality of the nerves or muscles of the kidney or ureter.  Lower part of the ureter protruding into the bladder (ureterocele).  Abnormal contractions of the bladder.  Both the kidneys can be affected during pregnancy. This is because the enlarging uterus presses on the ureters and blocks the flow of urine. SYMPTOMS  The symptoms depend on the location of the block. They also depend on how long the block has been present. You may feel pain on the affected side. Sometimes, you may not have any symptoms. There may be a dull ache or discomfort in the flank. The common symptoms are:  Flank pain.  Swelling of the abdomen.  Pain in the abdomen.  Nausea and vomiting.  Fever.  Pain while passing urine.  Urgency for urination.  Frequent or urgent urination.  Infection of the urinary tract. DIAGNOSIS  Your caregiver will examine you after asking about your symptoms. You may be asked to do blood and urine tests. Your caregiver  may order a special X-ray, ultrasound, or CT scan. Sometimes a rigid or flexible telescope (cystoscope) is used to view the site of the blockage.  TREATMENT  Treatment depends on the site, cause, and duration of the block. The goal of treatment is to remove the blockage. Your caregiver will plan the treatment based on your condition. The different types of treatment are:   Putting in a soft plastic tube (ureteral stent) to connect the bladder with the kidney. This will help in draining the urine.  Putting in a soft tube (nephrostomy tube). This is placed through skin into the kidney. The trapped urine is drained out through the back. A plastic bag is attached to your skin to hold the urine that has drained out.  Antibiotics to treat or prevent infection.  Breaking down of the stone (lithotripsy). HOME CARE INSTRUCTIONS   It may take some time for the hydronephrosis to go away (resolve). Drink fluids as directed by your caregiver , and get a lot of rest.  If you have a drain in, your caregiver will give you directions about how to care for it. Be sure you understand these directions completely before you go home.  Take any antibiotics, pain medications, or other prescriptions exactly as prescribed.  Follow-up with your caregivers as directed. SEEK MEDICAL CARE IF:   You continue to have flank pain, nausea, or difficulty with urination.  You have any problem with any type of drainage device.  Your urine becomes cloudy or bloody. SEEK  IMMEDIATE MEDICAL CARE IF:   You have severe flank and/or abdominal pain.  You develop vomiting and are unable to hold down fluids.  You develop a fever above 100.5 F (38.1 C), or as per your caregiver. MAKE SURE YOU:   Understand these instructions.  Will watch your condition.  Will get help right away if you are not doing well or get worse. Document Released: 07/18/2007 Document Revised: 12/13/2011 Document Reviewed: 09/03/2010 [facility]  Patient Information 2014 [place], [place].  Kidney Stones Kidney stones (urolithiasis) are deposits that form inside your kidneys. The intense pain is caused by the stone moving through the urinary tract. When the stone moves, the ureter goes into spasm around the stone. The stone is usually passed in the urine.  CAUSES   A disorder that makes certain neck glands produce too much parathyroid hormone (primary hyperparathyroidism).  A buildup of uric acid crystals, similar to gout in your joints.  Narrowing (stricture) of the ureter.  A kidney obstruction present at birth (congenital obstruction).  Previous surgery on the kidney or ureters.  Numerous kidney infections. SYMPTOMS   Feeling sick to your stomach (nauseous).  Throwing up (vomiting).  Blood in the urine (hematuria).  Pain that usually spreads (radiates) to the groin.  Frequency or urgency of urination. DIAGNOSIS   Taking a history and physical exam.  Blood or urine tests.  CT scan.  Occasionally, an examination of the inside of the urinary bladder (cystoscopy) is performed. TREATMENT   Observation.  Increasing your fluid intake.  Extracorporeal shock wave lithotripsy This is a noninvasive procedure that uses shock waves to break up kidney stones.  Surgery may be needed if you have severe pain or persistent obstruction. There are various surgical procedures. Most of the procedures are performed with the use of small instruments. Only small incisions are needed to accommodate these instruments, so recovery time is minimized. The size, location, and chemical composition are all important variables that will determine the proper choice of action for you. Talk to your health care provider to better understand your situation so that you will minimize the risk of injury to yourself and your kidney.  HOME CARE INSTRUCTIONS   Drink enough water and fluids to keep your urine clear or pale yellow. This will help you to  pass the stone or stone fragments.  Strain all urine through the provided strainer. Keep all particulate matter and stones for your health care provider to see. The stone causing the pain may be as small as a grain of salt. It is very important to use the strainer each and every time you pass your urine. The collection of your stone will allow your health care provider to analyze it and verify that a stone has actually passed. The stone analysis will often identify what you can do to reduce the incidence of recurrences.  Only take over-the-counter or prescription medicines for pain, discomfort, or fever as directed by your health care provider.  Make a follow-up appointment with your health care provider as directed.  Get follow-up X-rays if required. The absence of pain does not always mean that the stone has passed. It may have only stopped moving. If the urine remains completely obstructed, it can cause loss of kidney function or even complete destruction of the kidney. It is your responsibility to make sure X-rays and follow-ups are completed. Ultrasounds of the kidney can show blockages and the status of the kidney. Ultrasounds are not associated with any radiation and can be  performed easily in a matter of minutes. SEEK MEDICAL CARE IF:  You experience pain that is progressive and unresponsive to any pain medicine you have been prescribed. SEEK IMMEDIATE MEDICAL CARE IF:   Pain cannot be controlled with the prescribed medicine.  You have a fever or shaking chills.  The severity or intensity of pain increases over 18 hours and is not relieved by pain medicine.  You develop a new onset of abdominal pain.  You feel faint or pass out.  You are unable to urinate. MAKE SURE YOU:   Understand these instructions.  Will watch your condition.  Will get help right away if you are not doing well or get worse. Document Released: 09/20/2005 Document Revised: 05/23/2013 Document Reviewed:  02/21/2013 [facility] Patient Information 2014 [place].

## 2014-01-10 NOTE — ED Notes (Signed)
hes had RLQ abd pain x 1 week, intermittent since onset. hes had kidney stones but this feels different. Denies n/v/urinary symptoms. He said hes not had a bowel movement in 2 days

## 2014-01-10 NOTE — ED Provider Notes (Addendum)
CSN: [id number]     Arrival date & time 01/10/14  1234 History   First MD Initiated Contact with Patient 01/10/14 1314     Chief Complaint  Patient presents with  . Abdominal Pain     (Consider location/radiation/quality/duration/timing/severity/associated sxs/prior Treatment) Patient is a 68 y.o. male presenting with abdominal pain. The history is provided by the patient.  Abdominal Pain  He reports onset of intermittent right lower quadrant abdominal pain, one week ago. It has remained essentially in the same place. He denies urinary symptoms, back, pain, weakness, dizziness, nausea, vomiting, fever, or chills. He has not had recent problems, with kidney stones. He is taking all his medications, as directed. There are no other known modifying factors.  Past Medical History  Diagnosis Date  . Adenomatous colon polyp   . Old inferior wall myocardial infarction   . Sleep apnea   . Hypothyroidism   . Hyperlipidemia   . Gout   . Hypertensive heart disease   . CAD     Prior inferior MI treated with stent 1995    . GERD 02/24/2008  . 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 History of kidney stones   . Obesity (BMI 30-39.9)    Past Surgical History  Procedure Laterality Date  . Coronary angioplasty with stent placement  1996  . Coronary angioplasty  1995  . Colonoscopy    . Exploration post operative open heart N/A 09/17/2013    Procedure: EXPLORATION POST OPERATIVE OPEN HEART;  Surgeon: Melrose Nakayama, MD;  Location: [place];  Service: Open Heart Surgery;  Laterality: N/A;  . Coronary artery bypass graft N/A 09/17/2013    Procedure: CORONARY ARTERY BYPASS GRAFTING (CABG);  Surgeon: Melrose Nakayama, MD;  Location: [place];  Service: Open Heart Surgery;  Laterality: N/A;   History reviewed. No pertinent family history. History  Substance Use Topics  . Smoking status: Former Research scientist (life sciences)  . Smokeless tobacco: Never Used  . Alcohol Use: No    Review of Systems   Gastrointestinal: Positive for abdominal pain.      Allergies  Ace inhibitors  Home Medications   Current Outpatient Rx  Name  Route  Sig  Dispense  Refill  . allopurinol (ZYLOPRIM) 300 MG tablet   Oral   Take 300 mg by mouth daily.           [name] ALPRAZolam (XANAX) 0.5 MG tablet   Oral   Take 0.5 mg by mouth at bedtime as needed for sleep.         [name] aspirin EC 325 MG EC tablet   Oral   Take 1 tablet (325 mg total) by mouth daily.   30 tablet   0   . atorvastatin (LIPITOR) 10 MG tablet   Oral   Take 10 mg by mouth at bedtime.         . famotidine (PEPCID AC) 10 MG chewable tablet   Oral   Chew 10 mg by mouth daily.         . fluticasone (FLONASE) 50 MCG/ACT nasal spray   Each Nare   Place 2 sprays into both nostrils daily as needed for allergies.          [name] glucosamine-chondroitin 500-400 MG tablet   Oral   Take 1 tablet by mouth 2 (two) times daily.         [name] HYDROcodone-acetaminophen (NORCO/VICODIN) 5-325 MG per tablet   Oral   Take 1 tablet by  mouth every 6 (six) hours as needed for moderate pain.         [name] levothyroxine (SYNTHROID, LEVOTHROID) 75 MCG tablet   Oral   Take 75 mcg by mouth daily.           [name] losartan (COZAAR) 50 MG tablet   Oral   Take 50 mg by mouth daily.          . metoprolol tartrate (LOPRESSOR) 25 MG tablet   Oral   Take 1 tablet (25 mg total) by mouth 2 (two) times daily.   60 tablet   3   . Multiple Vitamins-Minerals (MULTIVITAMIN WITH MINERALS) tablet   Oral   Take 1 tablet by mouth daily.           [name] zolpidem (AMBIEN) 10 MG tablet   Oral   Take 10 mg by mouth at bedtime.          [name] oxyCODONE-acetaminophen (PERCOCET) 5-325 MG per tablet   Oral   Take 1 tablet by mouth every 4 (four) hours as needed for severe pain.   20 tablet   0   . tamsulosin (FLOMAX) 0.4 MG CAPS capsule      1 q HS to aid stone passage   7 capsule   0    BP 108/86  Pulse 76  Temp(Src) 98.3 F (36.8 C) (Oral)  Resp  14  SpO2 97% Physical Exam  Nursing note and vitals reviewed. Constitutional: He is oriented to person, place, and time. He appears well-developed and well-nourished. He appears distressed.  HENT:  Head: Normocephalic and atraumatic.  Right Ear: External ear normal.  Left Ear: External ear normal.  Eyes: Conjunctivae and EOM are normal. Pupils are equal, round, and reactive to light.  Neck: Normal range of motion and phonation normal. Neck supple.  Cardiovascular: Normal rate, regular rhythm, normal heart sounds and intact distal pulses.   Pulmonary/Chest: Effort normal and breath sounds normal. He exhibits no bony tenderness.  Abdominal: Soft. There is tenderness (RUQ, moderate).  Genitourinary:  Right CVAT with percussion  Musculoskeletal: Normal range of motion.  Neurological: He is alert and oriented to person, place, and time. No cranial nerve deficit or sensory deficit. He exhibits normal muscle tone. Coordination normal.  Skin: Skin is warm, dry and intact.  Psychiatric: He has a normal mood and affect. His behavior is normal. Judgment and thought content normal.    ED Course  Procedures (including critical care time)  Medications - No data to display  Patient Vitals for the past 24 hrs:  BP Temp Temp src Pulse Resp SpO2  01/10/14 1702 108/86 mmHg - - 76 14 97 %  01/10/14 1600 119/86 mmHg - - 66 - 92 %  01/10/14 1545 114/74 mmHg - - 67 - 95 %  01/10/14 1530 107/82 mmHg - - 68 - 93 %  01/10/14 1523 129/85 mmHg - - 67 - 93 %  01/10/14 1326 130/76 mmHg - - 70 18 96 %  01/10/14 1238 140/74 mmHg 98.3 F (36.8 C) Oral 75 20 96 %   16:30- is discussed with urology (Dr. Karsten Ro). They state that they can see him as an outpatient to manage his proximal ureter stone with hydronephrosis. They feel that he needs evaluation by his nephrologist, for the renal insufficiency   16:40--discuss with nephrology (Dr. Justin Mend). They will see him next week to evaluate the renal  insufficiency   5:07 PM Reevaluation with update and discussion. After initial assessment and treatment,  an updated evaluation reveals he remains comfortable. No additional complaints. Findings discussed with patient and wife. All questions answered.Richarda Blade     Labs Review Labs Reviewed  COMPREHENSIVE METABOLIC PANEL - Abnormal; Notable for the following:    Glucose, Bld 106 (*)    BUN 35 (*)    Creatinine, Ser 4.26 (*)    GFR calc non Af Amer 13 (*)    GFR calc Af Amer 15 (*)    All other components within normal limits  CBC WITH DIFFERENTIAL - Abnormal; Notable for the following:    Monocytes Relative 13 (*)    Monocytes Absolute 1.1 (*)    All other components within normal limits  URINALYSIS, ROUTINE W REFLEX MICROSCOPIC - Abnormal; Notable for the following:    Hgb urine dipstick SMALL (*)    Protein, ur 100 (*)    All other components within normal limits  URINE MICROSCOPIC-ADD ON   Imaging Review Ct Abdomen Pelvis Wo Contrast  01/10/2014   CLINICAL DATA:  Right lower quadrant abdominal pain  EXAM: CT ABDOMEN AND PELVIS WITHOUT CONTRAST  TECHNIQUE: Multidetector CT imaging of the abdomen and pelvis was performed following the standard protocol without intravenous contrast.  COMPARISON:  None.  FINDINGS: Atherosclerotic calcifications appreciated thin visualized coronary vessels. The lung bases otherwise unremarkable.  Noncontrast evaluation of the liver is unremarkable. Calcified gallstones appreciated within the gallbladder. The spleen, adrenals, pancreas are unremarkable.  Mild to moderate hydronephrosis within the right kidney primarily involving the renal pelvis. This is secondary to a 10 mm calculus within the proximal right ureter. Nonobstructing medullary calculi are appreciated within the right kidney largest measuring 9 mm. 2 Exophytic solid-appearing masses are appreciated within the anterior and posterior lower pole right kidney. The largest measures 3.7 cm .   Nonobstructing medullary calculi are appreciated within the left kidney as well as a vague low attenuating nodule posterior lower pole which may representing a cyst. The left ureter is unremarkable.  The bowel is negative.  There is no evidence of abdominal aortic aneurysm.  Within the limitations of a noncontrast CT no abdominal or pelvic free fluid, loculated fluid collections, adenopathy pole further masses.  There is no evidence of abdominal wall nor inguinal hernia.  No aggressive appearing osseous lesions identified. Degenerative changes are appreciated within the thoracolumbar spine.  IMPRESSION: 1. Mild to moderate obstructive uropathy within the right kidney secondary to a 1 cm proximal ureteral calculus. 2. Indeterminate solid appearing masses within the lower pole of the right kidney. Further evaluation with renal protocol to MRI, nonemergent is recommended. 3. Nonobstructing bilateral renal calculi 4. Multilevel spondylosis 5. Calcified gallstones   Electronically Signed   By: Margaree Mackintosh M.D.   On: 01/10/2014 15:12     EKG Interpretation None      MDM   Final diagnoses:  Ureteral stone  Hydronephrosis  Renal insufficiency    Urolithiasis with hydronephrosis causing pain, likely partially obstructed. Pain controlled in the emergency department. Worsening renal insufficiency, with concern for a nonspecific process in the right kidney. That will need further evaluation, as an outpatient.  Nursing Notes Reviewed/ Care Coordinated Applicable Imaging Reviewed Interpretation of Laboratory Data incorporated into ED treatment  The patient appears reasonably screened and/or stabilized for discharge and I doubt any other medical condition or other [facility] requiring further screening, evaluation, or treatment in the ED at this time prior to discharge.  Plan: Home Medications- Percocet, Flomax, Colace; Home Treatments- rest, strain urine; return here  if the recommended treatment, does not  improve the symptoms; Recommended follow up- Urology and Nephrology 1-5 days   Richarda Blade, MD 01/10/14 [place], MD 01/21/14 [id number]

## 2014-01-10 NOTE — ED Notes (Signed)
MD at bedside. 

## 2014-01-11 ENCOUNTER — Ambulatory Visit (HOSPITAL_COMMUNITY): Payer: Medicare Other | Admitting: Anesthesiology

## 2014-01-11 ENCOUNTER — Other Ambulatory Visit: Payer: Self-pay | Admitting: Urology

## 2014-01-11 ENCOUNTER — Encounter (HOSPITAL_COMMUNITY)
Admission: AD | Disposition: A | Discharge status: Home / Self Care | Payer: Self-pay | Source: Ambulatory Visit | Attending: Urology

## 2014-01-11 ENCOUNTER — Encounter (HOSPITAL_COMMUNITY): Payer: Medicare Other | Admitting: Anesthesiology

## 2014-01-11 ENCOUNTER — Other Ambulatory Visit: Payer: Self-pay

## 2014-01-11 ENCOUNTER — Ambulatory Visit (HOSPITAL_COMMUNITY)
Admission: AD | Admit: 2014-01-11 | Discharge: 2014-01-11 | Disposition: A | Discharge status: Home / Self Care | Payer: Medicare Other | Source: Ambulatory Visit | Attending: Urology | Admitting: Urology

## 2014-01-11 ENCOUNTER — Encounter (HOSPITAL_COMMUNITY): Payer: Self-pay | Admitting: *Deleted

## 2014-01-11 DIAGNOSIS — I129 Hypertensive chronic kidney disease with stage 1 through stage 4 chronic kidney disease, or unspecified chronic kidney disease: Secondary | ICD-10-CM | POA: Insufficient documentation

## 2014-01-11 DIAGNOSIS — Z951 Presence of aortocoronary bypass graft: Secondary | ICD-10-CM | POA: Diagnosis not present

## 2014-01-11 DIAGNOSIS — N201 Calculus of ureter: Secondary | ICD-10-CM | POA: Diagnosis not present

## 2014-01-11 DIAGNOSIS — N200 Calculus of kidney: Secondary | ICD-10-CM | POA: Insufficient documentation

## 2014-01-11 DIAGNOSIS — M109 Gout, unspecified: Secondary | ICD-10-CM | POA: Diagnosis not present

## 2014-01-11 DIAGNOSIS — N183 Chronic kidney disease, stage 3 unspecified: Secondary | ICD-10-CM | POA: Insufficient documentation

## 2014-01-11 DIAGNOSIS — G4730 Sleep apnea, unspecified: Secondary | ICD-10-CM | POA: Insufficient documentation

## 2014-01-11 DIAGNOSIS — I252 Old myocardial infarction: Secondary | ICD-10-CM | POA: Insufficient documentation

## 2014-01-11 DIAGNOSIS — E039 Hypothyroidism, unspecified: Secondary | ICD-10-CM | POA: Diagnosis not present

## 2014-01-11 DIAGNOSIS — I4892 Unspecified atrial flutter: Secondary | ICD-10-CM | POA: Insufficient documentation

## 2014-01-11 DIAGNOSIS — Z87891 Personal history of nicotine dependence: Secondary | ICD-10-CM | POA: Insufficient documentation

## 2014-01-11 DIAGNOSIS — N289 Disorder of kidney and ureter, unspecified: Secondary | ICD-10-CM | POA: Diagnosis not present

## 2014-01-11 DIAGNOSIS — E785 Hyperlipidemia, unspecified: Secondary | ICD-10-CM | POA: Insufficient documentation

## 2014-01-11 DIAGNOSIS — K219 Gastro-esophageal reflux disease without esophagitis: Secondary | ICD-10-CM | POA: Insufficient documentation

## 2014-01-11 DIAGNOSIS — E669 Obesity, unspecified: Secondary | ICD-10-CM | POA: Insufficient documentation

## 2014-01-11 DIAGNOSIS — I2510 Atherosclerotic heart disease of native coronary artery without angina pectoris: Secondary | ICD-10-CM | POA: Insufficient documentation

## 2014-01-11 HISTORY — PX: CYSTOSCOPY WITH RETROGRADE PYELOGRAM, URETEROSCOPY AND STENT PLACEMENT: SHX5789

## 2014-01-11 SURGERY — CYSTOURETEROSCOPY, WITH RETROGRADE PYELOGRAM AND STENT INSERTION
Anesthesia: General | Laterality: Right

## 2014-01-11 MED ORDER — LIDOCAINE HCL 2 % EX GEL
CUTANEOUS | Status: DC | PRN
  Administered 2014-01-11: 1

## 2014-01-11 MED ORDER — PROPOFOL 10 MG/ML IV BOLUS
INTRAVENOUS | Status: DC | PRN
  Administered 2014-01-11: 200 mg via INTRAVENOUS
  Administered 2014-01-11: 100 mg via INTRAVENOUS

## 2014-01-11 MED ORDER — EPHEDRINE SULFATE 50 MG/ML IJ SOLN
INTRAMUSCULAR | Status: DC | PRN
  Administered 2014-01-11: 15 mg via INTRAVENOUS
  Administered 2014-01-11: 10 mg via INTRAVENOUS

## 2014-01-11 MED ORDER — BELLADONNA ALKALOIDS-OPIUM 16.2-60 MG RE SUPP
RECTAL | Status: AC
  Filled 2014-01-11: qty 1

## 2014-01-11 MED ORDER — OXYBUTYNIN CHLORIDE 5 MG PO TABS: 5.0000 mg | ORAL_TABLET | Freq: Four times a day (QID) | ORAL | Status: DC | PRN

## 2014-01-11 MED ORDER — PHENYLEPHRINE HCL 10 MG/ML IJ SOLN
INTRAMUSCULAR | Status: DC | PRN
  Administered 2014-01-11 (×3): 60 ug via INTRAVENOUS
  Administered 2014-01-11: 40 ug via INTRAVENOUS

## 2014-01-11 MED ORDER — SUCCINYLCHOLINE CHLORIDE 20 MG/ML IJ SOLN
INTRAMUSCULAR | Status: DC | PRN
  Administered 2014-01-11: 150 mg via INTRAVENOUS

## 2014-01-11 MED ORDER — FENTANYL CITRATE 0.05 MG/ML IJ SOLN
INTRAMUSCULAR | Status: DC | PRN
  Administered 2014-01-11: 50 ug via INTRAVENOUS

## 2014-01-11 MED ORDER — FENTANYL CITRATE 0.05 MG/ML IJ SOLN: 25.0000 ug | INTRAMUSCULAR | Status: DC | PRN

## 2014-01-11 MED ORDER — CEFAZOLIN SODIUM-DEXTROSE 2-3 GM-% IV SOLR
INTRAVENOUS | Status: AC
  Filled 2014-01-11: qty 50

## 2014-01-11 MED ORDER — LIDOCAINE HCL 2 % EX GEL
CUTANEOUS | Status: AC
  Filled 2014-01-11: qty 10

## 2014-01-11 MED ORDER — LACTATED RINGERS IV SOLN
INTRAVENOUS | Status: DC | PRN
  Administered 2014-01-11: 19:00:00 via INTRAVENOUS

## 2014-01-11 MED ORDER — SODIUM CHLORIDE 0.9 % IR SOLN
Status: DC | PRN
  Administered 2014-01-11: 1000 mL

## 2014-01-11 MED ORDER — TAMSULOSIN HCL 0.4 MG PO CAPS: ORAL_CAPSULE | ORAL | Status: DC

## 2014-01-11 MED ORDER — CEFAZOLIN SODIUM-DEXTROSE 2-3 GM-% IV SOLR
2.0000 g | INTRAVENOUS | Status: AC
  Administered 2014-01-11: 2 g via INTRAVENOUS

## 2014-01-11 MED ORDER — LACTATED RINGERS IV SOLN: INTRAVENOUS | Status: DC

## 2014-01-11 MED ORDER — CEPHALEXIN 500 MG PO CAPS: 500.0000 mg | ORAL_CAPSULE | Freq: Three times a day (TID) | ORAL | Status: DC

## 2014-01-11 MED ORDER — SENNOSIDES-DOCUSATE SODIUM 8.6-50 MG PO TABS: 1.0000 | ORAL_TABLET | Freq: Two times a day (BID) | ORAL | Status: DC

## 2014-01-11 MED ORDER — BELLADONNA ALKALOIDS-OPIUM 16.2-60 MG RE SUPP
RECTAL | Status: DC | PRN
  Administered 2014-01-11: 1 via RECTAL

## 2014-01-11 MED ORDER — OXYCODONE-ACETAMINOPHEN 5-325 MG PO TABS
1.0000 | ORAL_TABLET | ORAL | Status: DC | PRN
Start: 2014-01-11 — End: 2014-02-01

## 2014-01-11 MED ORDER — ONDANSETRON HCL 4 MG/2ML IJ SOLN
INTRAMUSCULAR | Status: DC | PRN
  Administered 2014-01-11: 2 mg via INTRAVENOUS

## 2014-01-11 MED ORDER — KETAMINE HCL 10 MG/ML IJ SOLN
INTRAMUSCULAR | Status: AC
  Filled 2014-01-11: qty 1

## 2014-01-11 MED ORDER — KETAMINE HCL 10 MG/ML IJ SOLN
INTRAMUSCULAR | Status: DC | PRN
  Administered 2014-01-11: 25 mg via INTRAVENOUS

## 2014-01-11 MED ORDER — IOHEXOL 350 MG/ML SOLN
INTRAVENOUS | Status: DC | PRN
  Administered 2014-01-11: 20 mL via INTRAVENOUS

## 2014-01-11 SURGICAL SUPPLY — 26 items
BAG URO CATCHER STRL LF (DRAPE) ×2 IMPLANT
BASKET LASER NITINOL 1.9FR (BASKET) IMPLANT
BASKET STNLS GEMINI 4WIRE 3FR (BASKET) IMPLANT
BASKET ZERO TIP NITINOL 2.4FR (BASKET) IMPLANT
CATH CLEAR GEL 3F BACKSTOP (CATHETERS) IMPLANT
CATH URET 5FR 28IN CONE TIP (BALLOONS)
CATH URET 5FR 28IN OPEN ENDED (CATHETERS) ×2 IMPLANT
CATH URET 5FR 70CM CONE TIP (BALLOONS) IMPLANT
CATH URET DUAL LUMEN 6-10FR 50 (CATHETERS) IMPLANT
CLOTH BEACON ORANGE TIMEOUT ST (SAFETY) ×2 IMPLANT
DRAPE CAMERA CLOSED 9X96 (DRAPES) ×2 IMPLANT
FIBER LASER FLEXIVA 200 (UROLOGICAL SUPPLIES) IMPLANT
FIBER LASER FLEXIVA 365 (UROLOGICAL SUPPLIES) IMPLANT
GLOVE BIOGEL M 7.0 STRL (GLOVE) ×2 IMPLANT
GOWN STRL REUS W/TWL LRG LVL3 (GOWN DISPOSABLE) ×2 IMPLANT
GUIDEWIRE ANG ZIPWIRE 038X150 (WIRE) IMPLANT
GUIDEWIRE STR DUAL SENSOR (WIRE) ×2 IMPLANT
IV NS IRRIG 3000ML ARTHROMATIC (IV SOLUTION) ×2 IMPLANT
PACK CYSTO (CUSTOM PROCEDURE TRAY) ×2 IMPLANT
SCRUB PCMX 4 OZ (MISCELLANEOUS) ×2 IMPLANT
SHEATH ACCESS URETERAL 38CM (SHEATH) IMPLANT
SHEATH URET ACCESS 12FR/35CM (UROLOGICAL SUPPLIES) IMPLANT
SHEATH URET ACCESS 12FR/55CM (UROLOGICAL SUPPLIES) IMPLANT
STENT CONTOUR 6FRX24X.038 (STENTS) ×2 IMPLANT
SYRINGE IRR TOOMEY STRL 70CC (SYRINGE) IMPLANT
TUBING CONNECTING 10 (TUBING) ×2 IMPLANT

## 2014-01-11 NOTE — Anesthesia Preprocedure Evaluation (Addendum)
Anesthesia Evaluation  Patient identified by MRN, date of birth, ID band Patient awake    Reviewed: Allergy & Precautions, H&P , NPO status , Patient's Chart, lab work & pertinent test results, reviewed documented beta blocker date and time   Airway Mallampati: III TM Distance: >3 FB Neck ROM: full    Dental  (+) Missing, Dental Advisory Given Some missing teeth on the sides:   Pulmonary sleep apnea , former smoker,  breath sounds clear to auscultation  Pulmonary exam normal       Cardiovascular hypertension, Pt. on home beta blockers + CAD, + Past MI, + Cardiac Stents and + CABG + dysrhythmias Atrial Fibrillation Rhythm:regular Rate:Normal  MI 1995 treated with stent   Neuro/Psych negative neurological ROS  negative psych ROS   GI/Hepatic negative GI ROS, Neg liver ROS, GERD-  Medicated and Controlled,  Endo/Other  negative endocrine ROSHypothyroidism   Renal/GU CRFRenal diseaseStage 4 chronic kidney disease ( severe ). CR 4.26  negative genitourinary   Musculoskeletal   Abdominal   Peds  Hematology negative hematology ROS (+)   Anesthesia Other Findings   Reproductive/Obstetrics negative OB ROS                          Anesthesia Physical Anesthesia Plan  ASA: III  Anesthesia Plan: General   Post-op Pain Management:    Induction: Intravenous  Airway Management Planned: Oral ETT  Additional Equipment:   Intra-op Plan:   Post-operative Plan: Extubation in OR  Informed Consent: I have reviewed the patients History and Physical, chart, labs and discussed the procedure including the risks, benefits and alternatives for the proposed anesthesia with the patient or authorized representative who has indicated his/her understanding and acceptance.   Dental Advisory Given  Plan Discussed with: CRNA and Surgeon  Anesthesia Plan Comments:         Anesthesia Quick Evaluation

## 2014-01-11 NOTE — Progress Notes (Signed)
Called Dr. Landry Dyke and reported last EKG in [facility] was on 09/21/13 stating atrial flutter with rapid ventricular response. Order obtained for EKG today preop.

## 2014-01-11 NOTE — Addendum Note (Signed)
Addendum created 01/11/14 2037 by Sherry Ruffing, CRNA   Modules edited: Anesthesia LDA

## 2014-01-11 NOTE — Transfer of Care (Signed)
Immediate Anesthesia Transfer of Care Note  Patient: Dalton Mitchell  Procedure(s) Performed: Procedure(s): CYSTOSCOPY WITH RETROGRADE PYELOGRAM, URETEROSCOPY AND STENT PLACEMENT (Right)  Patient Location: PACU  Anesthesia Type:General  Level of Consciousness: awake, alert , sedated and patient cooperative  Airway & Oxygen Therapy: Patient Spontanous Breathing and Patient connected to face mask oxygen  Post-op Assessment: Report given to PACU RN and Post -op Vital signs reviewed and stable  Post vital signs: stable  Complications: No apparent anesthesia complications

## 2014-01-11 NOTE — Op Note (Signed)
Urology Operative Report  Date of Procedure: 01/11/14  Surgeon: Rolan Bucco, MD Assistant:  None  Preoperative Diagnosis: Right ureter stone. Postoperative Diagnosis:  Same  Procedure(s): Cystoscopy Right ureter stent placement (6 x 24, JJ, no tether) Right retrograde pyelogram with interpretation  Estimated blood loss: None  Specimen: None  Drains: None  Complications: None  Findings: Right mid ureter stone. Right hydronephrosis. No bladder tumors.   History of present illness: 68 year old presented to the ER with right flank pain. He was found to have an obstructing right ureter stone. He has chronic renal insufficiency and was found to have acute renal insufficiency on top of this. He presented to my clinic the following day were I reviewed his films and recommended right ureter stent placement. He presents today for right ureter stent placement.   Procedure in detail: After informed consent was obtained, the patient was taken to the operating room. They were placed in the supine position. SCDs were turned on and in place. IV antibiotics were infused, and general anesthesia was induced. A timeout was performed in which the correct patient, surgical site, and procedure were identified and agreed upon by the team.  The patient was placed in a dorsolithotomy position, making sure to pad all pertinent neurovascular pressure points. The genitals were prepped and draped in the usual sterile fashion.  A rigid cystoscope was advanced through the urethra and into the bladder. The bladder was drained. It was then fully distended and evaluated in a systematic fashion to visualize the entire surface of the bladder. This was negative for bladder tumors.  Obtained a right retrograde pyelogram by cannulating the right ureter orifice with a 5 French ureter catheter. I injected 20 cc of Omnipaque. There was a filling defect consistent with the stones in the mid ureter. There was proximal  hydroureter and hydronephrosis. I had to use 20 cc of contrast do to the difficulty with getting contrast past the stone. This kidney did not drain well.  A placed a sensor tip wire through the cystoscope, up the right ureter, and beyond the right ureter stone. There was some difficulty navigating around this, but I was eventually able to pass the wire into the right renal pelvis.  I placed a 6 x 24 double-J stent without tether over the wire under fluoroscopy through the cystoscope. This was deployed with a good curl in the right renal pelvis and a curl in the bladder. There was return of amber colored urine.  The bladder was drained, I placed 10 cc of lidocaine jelly into the urethra, and a belladonna and opium suppository into the rectum. Rectal examination today was benign. Prostate was 35 g in size without nodules.  This completed the procedure. He was placed back in supine position, anesthesia was reversed, and he was taken to the PACU in stable condition.  All counts were correct at the end of the case.  He will be scheduled for interval ureteroscopy after he is cardiac clearance from his cardiologist. He'll be given 3 additional days of Keflex to cover for his surgery today.

## 2014-01-11 NOTE — Discharge Instructions (Signed)
DISCHARGE INSTRUCTIONS FOR KIDNEY STONES OR URETERAL STENT  MEDICATIONS:   1. DO NOT RESUME YOUR ASPIRIN, or any other medicines like ibuprofen, motrin, excedrin, advil, aleve, vitamin E, fish oil as these can all cause bleeding x 7 days.  2. Resume all your other meds from home - except do not take any other pain meds that you may have at home.  ACTIVITY 1. No strenuous activity x 1week 2. No driving while on narcotic pain medications 3. Drink plenty of water 4. Continue to walk at home - you can still get blood clots when you are at home, so keep active, but don't over do it. 5. May return to work in 3 days.  BATHING 1. You can shower or a bath.    SIGNS/SYMPTOMS TO CALL: 1. Please call us if you have a fever greater than 101.5, uncontrolled  nausea/vomiting, uncontrolled pain, dizziness, unable to urinate, chest pain, shortness of breath, leg swelling, leg pain, redness around wound, drainage from wound, or any other concerns or questions.  You can reach [place] at [phone].  Post Anesthesia Home Care Instructions  Activity: Get plenty of rest for the remainder of the day. A responsible adult should stay with you for 24 hours following the procedure.  For the next 24 hours, DO NOT: -Drive a car -Paediatric nurse -Drink alcoholic beverages -Take any medication unless instructed by your physician -Make any legal decisions or sign important papers.  Meals: Start with liquid foods such as gelatin or soup. Progress to regular foods as tolerated. Avoid greasy, spicy, heavy foods. If nausea and/or vomiting occur, drink only clear liquids until the nausea and/or vomiting subsides. Call your physician if vomiting continues.  Special Instructions/Symptoms: Your throat may feel dry or sore from the anesthesia or the breathing tube placed in your throat during surgery. If this causes discomfort, gargle with warm salt water. The discomfort should disappear within 24 hours.

## 2014-01-11 NOTE — Anesthesia Postprocedure Evaluation (Signed)
  Anesthesia Post-op Note  Patient: Dalton Mitchell  Procedure(s) Performed: Procedure(s) (LRB): CYSTOSCOPY WITH RETROGRADE PYELOGRAM, URETEROSCOPY AND STENT PLACEMENT (Right)  Patient Location: PACU  Anesthesia Type: General  Level of Consciousness: awake and alert   Airway and Oxygen Therapy: Patient Spontanous Breathing  Post-op Pain: mild  Post-op Assessment: Post-op Vital signs reviewed, Patient's Cardiovascular Status Stable, Respiratory Function Stable, Patent Airway and No signs of Nausea or vomiting  Last Vitals:  Filed Vitals:   01/11/14 1957  BP:   Pulse:   Temp: 36.8 C  Resp:     Post-op Vital Signs: stable   Complications: No apparent anesthesia complications

## 2014-01-11 NOTE — H&P (Signed)
Urology History and Physical Exam  CC: Right ureter stone. Nephrolithiasis.  HPI:   68 year old male presents today with several pounds.  #1 right ureter stone.  This is a new problem.  His [place] in size.  It is obstructing the right kidney.  It is associated with right flank pain.  Not associated with fever.  This was seen on CT scan 01/06/14.  He has chronic renal failure.  Renal function yesterday in the ER showed a GFR of 13.  His last solid food was at 11:30 this morning and his last Irene Pap was at 9:00 this morning.  Because of his chronic renal failure I recommend cystoscopy, right retropyelogram, and right ureter stent placement urgently today.  We discussed the risks, benefits, side effects which include but are not limited to heart attack, stroke, death, pulmonary embolus, stent discomfort, inability to place ureter stent, need for right [place] nephrostomy tube placement.  #2 nephrolithiasis.  Bilateral nature.  He has a stone in the left kidney which appears to be 2 mm in size.  This is nonobstructing.  It is not certain stones in the right kidney the largest is once meter in the mid upper pole.  He has several stones that are 2-3 millimeters in size throughout the upper, middle, and lower poles with a total of at least 5 stones.  This is a chronic problem.  Nothing makes it better or worse.  We discussed management options.  I do not recommend intervention on the left kidney stone.  I do recommend that we definitively treat his right ureter stone after he has cardiac clearance.  He has a history of coronary artery disease and had coronary artery bypass graft surgery in December 2014.  He denies any current active chest pain.  We discussed management options along with risks, benefits, and side effects.  We discussed shockwave lithotripsy, and ureteroscopy.  I recommend ureteroscopy as he will be able to continue his aspirin with this procedure.  We discussed the risk and benefits which include  were not limited to bleeding, infection, allergic reaction, heart attack, stroke, death, ureteral stricture, ureteral avulsion, and need for repeat procedure.    PMH: Past Medical History  Diagnosis Date  . Adenomatous colon polyp   . Old inferior wall myocardial infarction   . Sleep apnea   . Hypothyroidism   . Hyperlipidemia   . Gout   . Hypertensive heart disease   . CAD     Prior inferior MI treated with stent 1995    . GERD 02/24/2008  . 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 History of kidney stones   . Obesity (BMI 30-39.9)     PSH: Past Surgical History  Procedure Laterality Date  . Coronary angioplasty with stent placement  1996  . Coronary angioplasty  1995  . Colonoscopy    . Exploration post operative open heart N/A 09/17/2013    Procedure: EXPLORATION POST OPERATIVE OPEN HEART;  Surgeon: Melrose Nakayama, MD;  Location: [facility];  Service: Open Heart Surgery;  Laterality: N/A;  . Coronary artery bypass graft N/A 09/17/2013    Procedure: CORONARY ARTERY BYPASS GRAFTING (CABG);  Surgeon: Melrose Nakayama, MD;  Location: [facility];  Service: Open Heart Surgery;  Laterality: N/A;    Allergies: Allergies  Allergen Reactions  . Ace Inhibitors Cough    Medications: No prescriptions prior to admission     Social History: History   Social History  . Marital Status: Married  Spouse Name: N/A    Number of Children: N/A  . Years of Education: N/A   Occupational History  . Not on file.   Social History Main Topics  . Smoking status: Former Research scientist (life sciences)  . Smokeless tobacco: Never Used  . Alcohol Use: No  . Drug Use: No  . Sexual Activity: Not on file   Other Topics Concern  . Not on file   Social History Narrative  . No narrative on file    Family History: No family history on file.  Review of Systems: Positive: Right flank pain. Negative: Fever, SOB, or chest pain.  A further 10 point review of systems was negative except what  is listed in the HPI.  Physical Exam: Filed Vitals:   01/11/14 1558  BP: 107/62  Pulse: 74  Temp: 97.9 F (36.6 C)  Resp: 18    General: No acute distress.  Awake. Head:  Normocephalic.  Atraumatic. ENT:  EOMI.  Mucous membranes moist Neck:  Supple.  No lymphadenopathy. CV:  S1 present. S2 present. Regular rate. Pulmonary: Equal effort bilaterally.  Clear to auscultation bilaterally. Abdomen: Soft.  Non- tender to palpation. Skin:  Normal turgor.  No visible rash. Extremity: No gross deformity of bilateral upper extremities.  No gross deformity of    bilateral lower extremities. Neurologic: Alert. Appropriate mood.    Studies:  Recent Labs     01/10/14  1256  HGB  13.6  WBC  8.7  PLT  223    Recent Labs     01/10/14  1256  NA  140  K  5.3  CL  102  CO2  21  BUN  35*  CREATININE  4.26*  CALCIUM  9.5  GFRNONAA  13*  GFRAA  15*     No results found for this basename: PT, INR, APTT,  in the last 72 hours   No components found with this basename: ABG,     Assessment:  Right ureter stone. Nephrolithiasis.  Plan: To OR for cystoscopy, right retropyelogram, and right ureter stent placement.

## 2014-01-14 ENCOUNTER — Encounter (HOSPITAL_COMMUNITY): Payer: Self-pay | Admitting: Urology

## 2014-01-16 DIAGNOSIS — N183 Chronic kidney disease, stage 3 unspecified: Secondary | ICD-10-CM | POA: Diagnosis not present

## 2014-01-17 DIAGNOSIS — R0989 Other specified symptoms and signs involving the circulatory and respiratory systems: Secondary | ICD-10-CM | POA: Diagnosis not present

## 2014-01-17 DIAGNOSIS — R0609 Other forms of dyspnea: Secondary | ICD-10-CM | POA: Diagnosis not present

## 2014-01-17 DIAGNOSIS — I4891 Unspecified atrial fibrillation: Secondary | ICD-10-CM | POA: Diagnosis not present

## 2014-01-17 DIAGNOSIS — I252 Old myocardial infarction: Secondary | ICD-10-CM | POA: Diagnosis not present

## 2014-01-17 DIAGNOSIS — I119 Hypertensive heart disease without heart failure: Secondary | ICD-10-CM | POA: Diagnosis not present

## 2014-01-17 DIAGNOSIS — E669 Obesity, unspecified: Secondary | ICD-10-CM | POA: Diagnosis not present

## 2014-01-17 DIAGNOSIS — E785 Hyperlipidemia, unspecified: Secondary | ICD-10-CM | POA: Diagnosis not present

## 2014-01-17 DIAGNOSIS — N183 Chronic kidney disease, stage 3 unspecified: Secondary | ICD-10-CM | POA: Diagnosis not present

## 2014-01-17 DIAGNOSIS — I2510 Atherosclerotic heart disease of native coronary artery without angina pectoris: Secondary | ICD-10-CM | POA: Diagnosis not present

## 2014-01-18 ENCOUNTER — Other Ambulatory Visit: Payer: Self-pay | Admitting: Urology

## 2014-01-18 DIAGNOSIS — N183 Chronic kidney disease, stage 3 unspecified: Secondary | ICD-10-CM | POA: Diagnosis not present

## 2014-01-18 DIAGNOSIS — E785 Hyperlipidemia, unspecified: Secondary | ICD-10-CM | POA: Diagnosis not present

## 2014-01-18 DIAGNOSIS — D631 Anemia in chronic kidney disease: Secondary | ICD-10-CM | POA: Diagnosis not present

## 2014-01-21 ENCOUNTER — Encounter (HOSPITAL_COMMUNITY): Payer: Self-pay | Admitting: Pharmacy Technician

## 2014-01-22 DIAGNOSIS — N201 Calculus of ureter: Secondary | ICD-10-CM | POA: Diagnosis not present

## 2014-01-25 ENCOUNTER — Encounter (HOSPITAL_COMMUNITY)
Admission: RE | Admit: 2014-01-25 | Discharge: 2014-01-25 | Disposition: A | Discharge status: Home / Self Care | Payer: Medicare Other | Source: Ambulatory Visit | Attending: Urology | Admitting: Urology

## 2014-01-25 ENCOUNTER — Encounter (HOSPITAL_COMMUNITY): Payer: Self-pay

## 2014-01-25 DIAGNOSIS — Z01812 Encounter for preprocedural laboratory examination: Secondary | ICD-10-CM | POA: Diagnosis not present

## 2014-01-25 LAB — BASIC METABOLIC PANEL
BUN: 38 mg/dL — AB (ref 6–23)
CALCIUM: 10.5 mg/dL (ref 8.4–10.5)
CO2: 25 mEq/L (ref 19–32)
Chloride: 103 mEq/L (ref 96–112)
Creatinine, Ser: 2.51 mg/dL — ABNORMAL HIGH (ref 0.50–1.35)
GFR calc Af Amer: 29 mL/min — ABNORMAL LOW (ref >90)
GFR calc non Af Amer: 25 mL/min — ABNORMAL LOW (ref >90)
GLUCOSE: 98 mg/dL (ref 70–99)
Potassium: 5.4 mEq/L — ABNORMAL HIGH (ref 3.7–5.3)
SODIUM: 140 meq/L (ref 137–147)

## 2014-01-25 LAB — CBC
HCT: 42 % (ref 39.0–52.0)
Hemoglobin: 13.1 g/dL (ref 13.0–17.0)
MCH: 27.1 pg (ref 26.0–34.0)
MCHC: 31.2 g/dL (ref 30.0–36.0)
MCV: 86.8 fL (ref 78.0–100.0)
PLATELETS: 235 10*3/uL (ref 150–400)
RBC: 4.84 MIL/uL (ref 4.22–5.81)
RDW: 15.9 % — ABNORMAL HIGH (ref 11.5–15.5)
WBC: 6.1 10*3/uL (ref 4.0–10.5)

## 2014-01-25 NOTE — Progress Notes (Signed)
Faxed bmet to Dr Jasmine December via epic

## 2014-01-25 NOTE — Progress Notes (Signed)
Chest x ray 1/15, ekg 4/15.  Patient to arrive 3 hours early day of surgery for IV placement- difficult access

## 2014-01-25 NOTE — Patient Instructions (Addendum)
Your procedure is scheduled on:  02/01/14  FRIDAY  Report to [facility] at    [place]   AM.  Call this number if you have problems the morning of surgery: [facility]   Do not eat food  :After Midnight.THURSDAY NIGHT-- MAY HAVE CLEAR LIQUIDS Friday MORNING UNTIL 0645am- THEN NOTHING BY MOUTH   Take these medicines the morning of surgery with A SIP OF WATER:METOPROLOL, Levothyroxine, Tamulosin, Famotidine May take Alprazolam/ Oxycodone if needed   .  Contacts, dentures or partial plates, or metal hairpins  can not be worn to surgery. Your family will be responsible for glasses, dentures, hearing aides while you are in surgery  Leave suitcase in the car. After surgery it may be brought to your room.  For patients admitted to the hospital, checkout time is 11:00 AM day of  discharge.                DO NOT WEAR JEWELRY, LOTIONS, POWDERS, OR PERFUMES.  WOMEN-- DO NOT SHAVE LEGS OR UNDERARMS FOR 48 HOURS BEFORE SHOWERS. MEN MAY SHAVE FACE.  Patients discharged the day of surgery will not be allowed to drive home. IF going home the day of surgery, you must have a driver and someone to stay with you for the first 24 hours  Name and phone number of your driver:       Wife  [facility] - Preparing for Surgery Before surgery, you can play an important role.  Because skin is not sterile, your skin needs to be as free of germs as possible.  You can reduce the number of germs on your skin by washing with CHG (chlorahexidine gluconate) soap before surgery.  CHG is an antiseptic cleaner which kills germs and bonds with the skin to continue killing germs even after washing. Please DO NOT use if you have an allergy to CHG or antibacterial soaps.  If your skin becomes reddened/irritated stop using the CHG  and inform your nurse when you arrive at Short Stay. Do not shave (including legs and underarms) for at least 48 hours prior to the first CHG shower.  You may shave your face. Please follow these instructions carefully:  1.  Shower with CHG Soap the night before surgery and the  morning of Surgery.  2.  If you choose to wash your hair, wash your hair first as usual with your  normal  shampoo.  3.  After you shampoo, rinse your hair and body thoroughly to remove the  shampoo.  4.  Use CHG as you would any other liquid soap.  You can apply chg directly  to the skin and wash                       Gently with a scrungie or clean washcloth.  5.  Apply the CHG Soap to your body ONLY FROM THE NECK DOWN.   Do not use on open                           Wound or open sores. Avoid contact with eyes, ears mouth and genitals (private parts).                        Genitals (private parts) with your normal soap.             6.  Wash thoroughly, paying special attention to the area where your surgery  will be performed.  7.  Thoroughly rinse your body with warm water from the neck down.  8.  DO NOT shower/wash with your normal soap after using and rinsing off  the CHG Soap.                9.  Pat yourself dry with a clean towel.            10.  Wear clean pajamas.            11.  Place clean sheets on your bed the night of your first shower and do not  sleep with pets. Day of Surgery : Do not apply any lotions/deodorants the morning of surgery.  Please wear clean clothes to the hospital/surgery center.  FAILURE TO FOLLOW THESE INSTRUCTIONS MAY RESULT IN THE CANCELLATION OF YOUR SURGERY PATIENT SIGNATURE_________________________________  NURSE SIGNATURE__________________________________    CLEAR LIQUID DIET   Foods Allowed                                                                     Foods Excluded  Coffee and tea, regular and decaf                             liquids  that you cannot  Plain Jell-O in any flavor                                             see through such as: Fruit ices (not with fruit pulp)                                     milk, soups, orange juice  Iced Popsicles                                    All solid food Carbonated beverages, regular and diet  Cranberry, grape and apple juices Sports drinks like Gatorade Lightly seasoned clear broth or consume(fat free) Sugar, honey syrup  Sample Menu Breakfast                                Lunch                                     Supper Cranberry juice                    Beef broth                            Chicken broth Jell-O                                     Grape juice                           Apple juice Coffee or tea                        Jell-O                                      Popsicle                                                Coffee or tea                        Coffee or tea

## 2014-02-01 ENCOUNTER — Encounter (HOSPITAL_COMMUNITY): Payer: Self-pay | Admitting: *Deleted

## 2014-02-01 ENCOUNTER — Ambulatory Visit (HOSPITAL_COMMUNITY)
Admission: RE | Admit: 2014-02-01 | Discharge: 2014-02-01 | Disposition: A | Discharge status: Home / Self Care | Payer: Medicare Other | Source: Ambulatory Visit | Attending: Urology | Admitting: Urology

## 2014-02-01 ENCOUNTER — Encounter (HOSPITAL_COMMUNITY): Payer: Medicare Other | Admitting: Anesthesiology

## 2014-02-01 ENCOUNTER — Encounter (HOSPITAL_COMMUNITY)
Admission: RE | Disposition: A | Discharge status: Home / Self Care | Payer: Self-pay | Source: Ambulatory Visit | Attending: Urology

## 2014-02-01 ENCOUNTER — Ambulatory Visit (HOSPITAL_COMMUNITY): Payer: Medicare Other | Admitting: Anesthesiology

## 2014-02-01 DIAGNOSIS — I2510 Atherosclerotic heart disease of native coronary artery without angina pectoris: Secondary | ICD-10-CM | POA: Insufficient documentation

## 2014-02-01 DIAGNOSIS — N201 Calculus of ureter: Secondary | ICD-10-CM | POA: Diagnosis not present

## 2014-02-01 DIAGNOSIS — E039 Hypothyroidism, unspecified: Secondary | ICD-10-CM | POA: Diagnosis not present

## 2014-02-01 DIAGNOSIS — G4730 Sleep apnea, unspecified: Secondary | ICD-10-CM | POA: Insufficient documentation

## 2014-02-01 DIAGNOSIS — N200 Calculus of kidney: Secondary | ICD-10-CM | POA: Diagnosis not present

## 2014-02-01 DIAGNOSIS — Z87891 Personal history of nicotine dependence: Secondary | ICD-10-CM | POA: Insufficient documentation

## 2014-02-01 DIAGNOSIS — Z9861 Coronary angioplasty status: Secondary | ICD-10-CM | POA: Diagnosis not present

## 2014-02-01 DIAGNOSIS — E785 Hyperlipidemia, unspecified: Secondary | ICD-10-CM | POA: Diagnosis not present

## 2014-02-01 DIAGNOSIS — N183 Chronic kidney disease, stage 3 unspecified: Secondary | ICD-10-CM | POA: Insufficient documentation

## 2014-02-01 DIAGNOSIS — E669 Obesity, unspecified: Secondary | ICD-10-CM | POA: Diagnosis not present

## 2014-02-01 DIAGNOSIS — K219 Gastro-esophageal reflux disease without esophagitis: Secondary | ICD-10-CM | POA: Diagnosis not present

## 2014-02-01 DIAGNOSIS — I1310 Hypertensive heart and chronic kidney disease without heart failure, with stage 1 through stage 4 chronic kidney disease, or unspecified chronic kidney disease: Secondary | ICD-10-CM | POA: Diagnosis not present

## 2014-02-01 DIAGNOSIS — M109 Gout, unspecified: Secondary | ICD-10-CM | POA: Insufficient documentation

## 2014-02-01 DIAGNOSIS — Z888 Allergy status to other drugs, medicaments and biological substances status: Secondary | ICD-10-CM | POA: Insufficient documentation

## 2014-02-01 HISTORY — PX: HOLMIUM LASER APPLICATION: SHX5852

## 2014-02-01 HISTORY — PX: CYSTOSCOPY WITH RETROGRADE PYELOGRAM, URETEROSCOPY AND STENT PLACEMENT: SHX5789

## 2014-02-01 LAB — POCT I-STAT 4, (NA,K, GLUC, HGB,HCT)
GLUCOSE: 91 mg/dL (ref 70–99)
HCT: 43 % (ref 39.0–52.0)
Hemoglobin: 14.6 g/dL (ref 13.0–17.0)
Potassium: 4.3 mEq/L (ref 3.7–5.3)
Sodium: 142 mEq/L (ref 137–147)

## 2014-02-01 SURGERY — CYSTOURETEROSCOPY, WITH RETROGRADE PYELOGRAM AND STENT INSERTION
Anesthesia: General | Site: Ureter | Laterality: Right

## 2014-02-01 MED ORDER — PROPOFOL 10 MG/ML IV BOLUS
INTRAVENOUS | Status: DC | PRN
  Administered 2014-02-01: 200 mg via INTRAVENOUS

## 2014-02-01 MED ORDER — CISATRACURIUM BESYLATE 20 MG/10ML IV SOLN
INTRAVENOUS | Status: AC
  Filled 2014-02-01: qty 10

## 2014-02-01 MED ORDER — SUCCINYLCHOLINE CHLORIDE 20 MG/ML IJ SOLN
INTRAMUSCULAR | Status: DC | PRN
  Administered 2014-02-01: 100 mg via INTRAVENOUS

## 2014-02-01 MED ORDER — LACTATED RINGERS IV SOLN: INTRAVENOUS | Status: DC

## 2014-02-01 MED ORDER — OXYCODONE-ACETAMINOPHEN 5-325 MG PO TABS: 1.0000 | ORAL_TABLET | ORAL | Status: DC | PRN

## 2014-02-01 MED ORDER — PHENYLEPHRINE 40 MCG/ML (10ML) SYRINGE FOR IV PUSH (FOR BLOOD PRESSURE SUPPORT)
PREFILLED_SYRINGE | INTRAVENOUS | Status: AC
  Filled 2014-02-01: qty 10

## 2014-02-01 MED ORDER — CISATRACURIUM BESYLATE (PF) 10 MG/5ML IV SOLN
INTRAVENOUS | Status: DC | PRN
  Administered 2014-02-01 (×2): 2 mg via INTRAVENOUS

## 2014-02-01 MED ORDER — SODIUM CHLORIDE 0.9 % IV SOLN
INTRAVENOUS | Status: DC
  Administered 2014-02-01: 1000 mL via INTRAVENOUS
  Administered 2014-02-01: 15:00:00 via INTRAVENOUS

## 2014-02-01 MED ORDER — HYDROMORPHONE HCL PF 1 MG/ML IJ SOLN: 0.2500 mg | INTRAMUSCULAR | Status: DC | PRN

## 2014-02-01 MED ORDER — CEPHALEXIN 500 MG PO CAPS: 500.0000 mg | ORAL_CAPSULE | Freq: Three times a day (TID) | ORAL | Status: DC

## 2014-02-01 MED ORDER — PHENYLEPHRINE HCL 10 MG/ML IJ SOLN
INTRAMUSCULAR | Status: AC
  Filled 2014-02-01: qty 1

## 2014-02-01 MED ORDER — MIDAZOLAM HCL 2 MG/2ML IJ SOLN
INTRAMUSCULAR | Status: AC
  Filled 2014-02-01: qty 2

## 2014-02-01 MED ORDER — DEXTROSE 5 % IV SOLN
INTRAVENOUS | Status: AC
  Filled 2014-02-01: qty 2

## 2014-02-01 MED ORDER — BELLADONNA ALKALOIDS-OPIUM 16.2-60 MG RE SUPP
RECTAL | Status: DC | PRN
  Administered 2014-02-01: 1 via RECTAL

## 2014-02-01 MED ORDER — FENTANYL CITRATE 0.05 MG/ML IJ SOLN
INTRAMUSCULAR | Status: DC | PRN
  Administered 2014-02-01 (×2): 50 ug via INTRAVENOUS

## 2014-02-01 MED ORDER — SODIUM CHLORIDE 0.9 % IJ SOLN
INTRAMUSCULAR | Status: AC
  Filled 2014-02-01: qty 10

## 2014-02-01 MED ORDER — SODIUM CHLORIDE 0.9 % IR SOLN
Status: DC | PRN
  Administered 2014-02-01: 1000 mL

## 2014-02-01 MED ORDER — BELLADONNA ALKALOIDS-OPIUM 16.2-60 MG RE SUPP
RECTAL | Status: AC
  Filled 2014-02-01: qty 1

## 2014-02-01 MED ORDER — METOCLOPRAMIDE HCL 5 MG/ML IJ SOLN
INTRAMUSCULAR | Status: DC | PRN
  Administered 2014-02-01: 10 mg via INTRAVENOUS

## 2014-02-01 MED ORDER — GLYCOPYRROLATE 0.2 MG/ML IJ SOLN
INTRAMUSCULAR | Status: DC | PRN
  Administered 2014-02-01: 0.4 mg via INTRAVENOUS

## 2014-02-01 MED ORDER — LIDOCAINE HCL 2 % EX GEL
CUTANEOUS | Status: DC | PRN
  Administered 2014-02-01: 1 via TOPICAL

## 2014-02-01 MED ORDER — ONDANSETRON HCL 4 MG/2ML IJ SOLN
INTRAMUSCULAR | Status: DC | PRN
  Administered 2014-02-01: 4 mg via INTRAVENOUS

## 2014-02-01 MED ORDER — PHENYLEPHRINE HCL 10 MG/ML IJ SOLN
INTRAMUSCULAR | Status: DC | PRN
  Administered 2014-02-01: 80 ug via INTRAVENOUS
  Administered 2014-02-01: 120 ug via INTRAVENOUS
  Administered 2014-02-01: 160 ug via INTRAVENOUS
  Administered 2014-02-01: 80 ug via INTRAVENOUS
  Administered 2014-02-01: 40 ug via INTRAVENOUS
  Administered 2014-02-01: 120 ug via INTRAVENOUS
  Administered 2014-02-01: 200 ug via INTRAVENOUS
  Administered 2014-02-01 (×2): 80 ug via INTRAVENOUS
  Administered 2014-02-01: 40 ug via INTRAVENOUS
  Administered 2014-02-01: 160 ug via INTRAVENOUS
  Administered 2014-02-01: 120 ug via INTRAVENOUS
  Administered 2014-02-01 (×2): 80 ug via INTRAVENOUS
  Administered 2014-02-01 (×2): 120 ug via INTRAVENOUS
  Administered 2014-02-01: 160 ug via INTRAVENOUS

## 2014-02-01 MED ORDER — PHENYLEPHRINE 40 MCG/ML (10ML) SYRINGE FOR IV PUSH (FOR BLOOD PRESSURE SUPPORT)
PREFILLED_SYRINGE | INTRAVENOUS | Status: AC
  Filled 2014-02-01: qty 20

## 2014-02-01 MED ORDER — PHENYLEPHRINE HCL 10 MG/ML IJ SOLN
10.0000 mg | INTRAVENOUS | Status: DC | PRN
  Administered 2014-02-01: 50 ug/min via INTRAVENOUS

## 2014-02-01 MED ORDER — SODIUM CHLORIDE 0.9 % IR SOLN
Status: DC | PRN
  Administered 2014-02-01: 3000 mL

## 2014-02-01 MED ORDER — MIDAZOLAM HCL 5 MG/5ML IJ SOLN
INTRAMUSCULAR | Status: DC | PRN
  Administered 2014-02-01 (×2): 1 mg via INTRAVENOUS

## 2014-02-01 MED ORDER — DEXAMETHASONE SODIUM PHOSPHATE 4 MG/ML IJ SOLN
INTRAMUSCULAR | Status: DC | PRN
  Administered 2014-02-01: 10 mg via INTRAVENOUS

## 2014-02-01 MED ORDER — PROPOFOL 10 MG/ML IV BOLUS
INTRAVENOUS | Status: AC
  Filled 2014-02-01: qty 20

## 2014-02-01 MED ORDER — CEFOXITIN SODIUM 2 G IV SOLR
2.0000 g | INTRAVENOUS | Status: AC
  Administered 2014-02-01: 2 g via INTRAVENOUS

## 2014-02-01 MED ORDER — IOHEXOL 350 MG/ML SOLN
INTRAVENOUS | Status: DC | PRN
  Administered 2014-02-01: 10 mL

## 2014-02-01 MED ORDER — GLYCOPYRROLATE 0.2 MG/ML IJ SOLN
INTRAMUSCULAR | Status: AC
  Filled 2014-02-01: qty 1

## 2014-02-01 MED ORDER — FENTANYL CITRATE 0.05 MG/ML IJ SOLN
INTRAMUSCULAR | Status: AC
  Filled 2014-02-01: qty 5

## 2014-02-01 MED ORDER — EPHEDRINE SULFATE 50 MG/ML IJ SOLN
INTRAMUSCULAR | Status: DC | PRN
  Administered 2014-02-01 (×4): 10 mg via INTRAVENOUS
  Administered 2014-02-01: 20 mg via INTRAVENOUS
  Administered 2014-02-01: 15 mg via INTRAVENOUS
  Administered 2014-02-01: 20 mg via INTRAVENOUS

## 2014-02-01 MED ORDER — ESMOLOL HCL 10 MG/ML IV SOLN
INTRAVENOUS | Status: DC | PRN
  Administered 2014-02-01 (×2): 20 mg via INTRAVENOUS

## 2014-02-01 MED ORDER — EPHEDRINE SULFATE 50 MG/ML IJ SOLN
INTRAMUSCULAR | Status: AC
Start: 2014-02-01 — End: 2014-02-01
  Filled 2014-02-01: qty 1

## 2014-02-01 MED ORDER — 0.9 % SODIUM CHLORIDE (POUR BTL) OPTIME
TOPICAL | Status: DC | PRN
  Administered 2014-02-01: 1000 mL

## 2014-02-01 MED ORDER — LIDOCAINE HCL 2 % EX GEL
CUTANEOUS | Status: AC
  Filled 2014-02-01: qty 10

## 2014-02-01 MED ORDER — NEOSTIGMINE METHYLSULFATE 10 MG/10ML IV SOLN
INTRAVENOUS | Status: DC | PRN
  Administered 2014-02-01: 4 mg via INTRAVENOUS

## 2014-02-01 MED ORDER — NEOSTIGMINE METHYLSULFATE 10 MG/10ML IV SOLN
INTRAVENOUS | Status: AC
  Filled 2014-02-01: qty 1

## 2014-02-01 MED ORDER — EPHEDRINE SULFATE 50 MG/ML IJ SOLN
INTRAMUSCULAR | Status: AC
  Filled 2014-02-01: qty 1

## 2014-02-01 MED ORDER — LACTATED RINGERS IV SOLN
INTRAVENOUS | Status: DC
  Administered 2014-02-01: 11:00:00 via INTRAVENOUS

## 2014-02-01 SURGICAL SUPPLY — 29 items
BAG URO CATCHER STRL LF (DRAPE) ×3 IMPLANT
BASKET LASER NITINOL 1.9FR (BASKET) IMPLANT
BASKET STNLS GEMINI 4WIRE 3FR (BASKET) IMPLANT
BASKET ZERO TIP NITINOL 2.4FR (BASKET) IMPLANT
CATH CLEAR GEL 3F BACKSTOP (CATHETERS) IMPLANT
CATH URET 5FR 28IN CONE TIP (BALLOONS)
CATH URET 5FR 28IN OPEN ENDED (CATHETERS) ×3 IMPLANT
CATH URET 5FR 70CM CONE TIP (BALLOONS) IMPLANT
CATH URET DUAL LUMEN 6-10FR 50 (CATHETERS) IMPLANT
CLOTH BEACON ORANGE TIMEOUT ST (SAFETY) ×3 IMPLANT
DRAPE CAMERA CLOSED 9X96 (DRAPES) ×3 IMPLANT
EXTRACTOR STONE NITINOL NGAGE (UROLOGICAL SUPPLIES) ×3 IMPLANT
FIBER LASER FLEXIVA 200 (UROLOGICAL SUPPLIES) ×3 IMPLANT
FIBER LASER FLEXIVA 365 (UROLOGICAL SUPPLIES) IMPLANT
GLOVE BIOGEL M 7.0 STRL (GLOVE) ×3 IMPLANT
GOWN STRL REUS W/TWL LRG LVL3 (GOWN DISPOSABLE) ×6 IMPLANT
GOWN STRL REUS W/TWL XL LVL3 (GOWN DISPOSABLE) IMPLANT
GUIDEWIRE ANG ZIPWIRE 038X150 (WIRE) IMPLANT
GUIDEWIRE STR DUAL SENSOR (WIRE) ×6 IMPLANT
IV NS IRRIG 3000ML ARTHROMATIC (IV SOLUTION) IMPLANT
MANIFOLD NEPTUNE II (INSTRUMENTS) ×3 IMPLANT
PACK CYSTO (CUSTOM PROCEDURE TRAY) ×3 IMPLANT
SCRUB PCMX 4 OZ (MISCELLANEOUS) ×3 IMPLANT
SHEATH ACCESS URETERAL 38CM (SHEATH) ×3 IMPLANT
SHEATH URET ACCESS 12FR/35CM (UROLOGICAL SUPPLIES) IMPLANT
SHEATH URET ACCESS 12FR/55CM (UROLOGICAL SUPPLIES) IMPLANT
STENT SFT UR WO WIRE 6FRX24CM (STENTS) ×3 IMPLANT
SYRINGE IRR TOOMEY STRL 70CC (SYRINGE) IMPLANT
TUBING CONNECTING 10 (TUBING) ×3 IMPLANT

## 2014-02-01 NOTE — Discharge Instructions (Signed)
DISCHARGE INSTRUCTIONS FOR KIDNEY STONES OR URETERAL STENT  MEDICATIONS:   1. Resume all your other meds from home.  ACTIVITY 1. No strenuous activity x 1week 2. No driving while on narcotic pain medications 3. Drink plenty of water 4. Continue to walk at home - you can still get blood clots when you are at home, so keep active, but don't over do it. 5. May return to work in 3 days.  BATHING 1. You can shower. 2. If you have a string coming from your urethra:  The stent string is attached to your ureteral stent.  Do not pull on this.  If the stent gets pulled our partially before it is time to remove it, go ahead and remove the entire stent.  Call if you develop significant pain that lasts more than an hour.    SIGNS/SYMPTOMS TO CALL: 1. Please call us if you have a fever greater than 101.5, uncontrolled  nausea/vomiting, uncontrolled pain, dizziness, unable to urinate, chest pain, shortness of breath, leg swelling, leg pain, redness around wound, drainage from wound, or any other concerns or questions.  You can reach [place] at [phone].

## 2014-02-01 NOTE — Anesthesia Preprocedure Evaluation (Addendum)
Anesthesia Evaluation  Patient identified by MRN, date of birth, ID band Patient awake    Reviewed: Allergy & Precautions, H&P , NPO status , Patient's Chart, lab work & pertinent test results, reviewed documented beta blocker date and time   Airway Mallampati: III TM Distance: >3 FB Neck ROM: Full    Dental no notable dental hx. (+) Teeth Intact, Dental Advisory Given   Pulmonary sleep apnea , former smoker,  breath sounds clear to auscultation  Pulmonary exam normal       Cardiovascular hypertension, Pt. on home beta blockers + CAD, + Past MI, + Cardiac Stents and + CABG + dysrhythmias Atrial Fibrillation Rhythm:Regular Rate:Normal  MI with stent in 1995   Neuro/Psych negative neurological ROS  negative psych ROS   GI/Hepatic negative GI ROS, Neg liver ROS, GERD-  Medicated and Controlled,  Endo/Other  negative endocrine ROSHypothyroidism   Renal/GU CRFRenal diseaseStage 4 chronic kidney disease. CR 2.54  negative genitourinary   Musculoskeletal negative musculoskeletal ROS (+)   Abdominal   Peds negative pediatric ROS (+)  Hematology negative hematology ROS (+)   Anesthesia Other Findings   Reproductive/Obstetrics negative OB ROS                          Anesthesia Physical Anesthesia Plan  ASA: III  Anesthesia Plan: General   Post-op Pain Management:    Induction:   Airway Management Planned: Oral ETT  Additional Equipment:   Intra-op Plan:   Post-operative Plan: Extubation in OR  Informed Consent:   Plan Discussed with: Surgeon  Anesthesia Plan Comments:         Anesthesia Quick Evaluation

## 2014-02-01 NOTE — Anesthesia Postprocedure Evaluation (Signed)
  Anesthesia Post-op Note  Patient: Dalton Mitchell  Procedure(s) Performed: Procedure(s) (LRB): CYSTOSCOPY WITH RETROGRADE PYELOGRAM, URETEROSCOPY AND STENT EXCHANGE (Right) HOLMIUM LASER APPLICATION (Right)  Patient Location: PACU  Anesthesia Type: General  Level of Consciousness: awake and alert   Airway and Oxygen Therapy: Patient Spontanous Breathing  Post-op Pain: mild  Post-op Assessment: Post-op Vital signs reviewed, Patient's Cardiovascular Status Stable, Respiratory Function Stable, Patent Airway and No signs of Nausea or vomiting  Last Vitals:  Filed Vitals:   02/01/14 1636  BP: 121/79  Pulse: 80  Temp: 36.5 C  Resp: 16    Post-op Vital Signs: stable   Complications: No apparent anesthesia complications

## 2014-02-01 NOTE — Transfer of Care (Signed)
Immediate Anesthesia Transfer of Care Note  Patient: Dalton Mitchell  Procedure(s) Performed: Procedure(s): CYSTOSCOPY WITH RETROGRADE PYELOGRAM, URETEROSCOPY AND STENT EXCHANGE (Right) HOLMIUM LASER APPLICATION (Right)  Patient Location: PACU  Anesthesia Type:General  Level of Consciousness: Patient easily awoken, sedated, comfortable, cooperative, following commands, responds to stimulation.   Airway & Oxygen Therapy: Patient spontaneously breathing, ventilating well, oxygen via simple oxygen mask.  Post-op Assessment: Report given to PACU RN, vital signs reviewed and stable, moving all extremities.   Post vital signs: Reviewed and stable.  Complications: No apparent anesthesia complications

## 2014-02-01 NOTE — Op Note (Signed)
Urology Operative Report  Date of Procedure: 02/01/14  Surgeon: Rolan Bucco, MD Assistant:  None  Preoperative Diagnosis: Left ureter stone. Left nephrolithiasis. Postoperative Diagnosis:  Same  Procedure(s): Left ureteroscopy with laser lithotripsy and stone removal of left ureter stone. Left ureteroscopy with laser lithotripsy and stone removal of left kidney stone. Left ureter stent removal. Left ureter stent placement with 6 x 24 double-J stent without tether. Left retrograde PolyGram with interpretation. Cystoscopy.  Estimated blood loss: None  Specimen: Stones sent to [facility] lab for analysis.  Drains: None  Complications: None  Findings: Left sided hydronephrosis. Multiple areas of calyceal stenosis. Large left proximal ureter stone and left renal stone multiple small renal stones.  History of present illness: 68 year old all the history of chronic renal deficiency presented with acute renal insufficiency due to an obstructing left ureter stone. He had a left ureter stent placement. He presents today for interval ureteroscopy for removal of the left ureter stone and left renal stones.   Procedure in detail: After informed consent was obtained, the patient was taken to the operating room. They were placed in the supine position. SCDs were turned on and in place. IV antibiotics were infused, and general anesthesia was induced. A timeout was performed in which the correct patient, surgical site, and procedure were identified and agreed upon by the team.  The patient was placed in a dorsolithotomy position, making sure to pad all pertinent neurovascular pressure points. The genitals were prepped and draped in the usual sterile fashion.  A rigid cystoscope was advanced through the urethra and into the bladder. The bladder was drained. Attention was turned to the right ureter orifice. A stent grasper was used to pull the stent urethra meatus and a sensor wire was placed up this  and into the right renal pelvis on fluoroscopy. This was secured as the safety wire.  I obtained a right retrograde pyelogram by inserting a 5 [place] ureter catheter into the right ureter orifice and injecting 10 cc of Omnipaque. This filled up into the proximal ureter where filling defect was noted. I then advanced a 5 [place] ureter catheter to the mid ureter and injected 10 more cc of Omnipaque. This showed a clear filling defect in the proximal ureter consistent with the stone as well as hydronephrosis of the right kidney.  A sensor wire was placed through the 5 [place] catheter into the right renal pelvis on fluoroscopy. The cystoscope was removed after the bladder was drained and so with a 5 [place] ureter catheter.  I placed a medium length 12-14 ureter access sheath over the wire under fluoroscopy with ease to the proximal ureter. The obturator and working wire were removed. Multiple digital ureter scope was advanced through the ureter access sheath until the stone was encountered. Lithotripsy was carried out with a 200  holmium laser filament at settings of 0.5 J and 20 Hz. Fragments were removed from the ureter.  I then navigated into the right renal pelvis. There was noted to be no stenosis or obstruction of the ureteropelvic junction. I then evaluated the kidney in a systematic fashion. There were multiple small stones as well as a larger stone which were noted on CT scan. The smaller stones were removed with a basket. The larger stone required lithotripsy. I used a 200  holmium laser filament at settings of 0.5 J and 20 Hz. The fragments were then removed.  The kidney was reevaluated. There is noted to be a large amount of stone dust and  poor visualization due to the fact that there remained a large amount of hydronephrosis in the collecting system.  I withdrew the ureter scope and access sheath. This was negative for injury to the left ureter. Due to the large amount of debris elected to  leave a stent in place. The safety wire was placed through the cystoscope and a 6 x 24 double-J stent was placed over the wire under fluoroscopy through the cystoscope with ease. A curl deployed in the right renal pelvis with fluoroscopy and in the bladder on direct visualization. The bladder was drained.  I withdrew the cystoscope. I placed 10 cc of lidocaine jelly into the urethra and a B&O suppository into the rectum. Prostate was without nodules and was estimated the approximate 40 cc in size.  He's placed in a supine position, anesthesia was reversed, and he was taken to the [facility] in stable condition.  All counts were correct at the end of the case.  He will be given Keflex to take prior to stent removal of followup. He will be able to resume all of his medications today.

## 2014-02-01 NOTE — H&P (Signed)
Urology History and Physical Exam  CC: Right ureter stone. Right nephrolithiasis.  HPI:  68 year old male presents today for right ureter stone and nephrolithiasis.  This was discovered on CT scan 01/06/14.  He has chronic renal insufficiency and he had acute renal insufficiency due to obstruction of the stone.  Because of this he underwent placement of a right ureter stent for/10/15.  Stone in his right ureter is 1 cm in size.  He also has stones in the upper right kidney which is 1 cm.  There were several other stones in the right kidney which were 2-3 millimeters.  The left kidney has a 2 mm nonobstructing stone.  He has a history of coronary artery disease, and he was cleared by his cardiologist, Dr. Wynonia Lawman, for surgery.  UA 01/22/14 was negative for signs of infection.  PMH: Past Medical History  Diagnosis Date  . Adenomatous colon polyp   . Old inferior wall myocardial infarction   . Hypothyroidism   . Hyperlipidemia   . Gout   . Hypertensive heart disease   . CAD     Prior inferior MI treated with stent 1995    . GERD 02/24/2008  . 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 History of kidney stones   . Obesity (BMI 30-39.9)   . Sleep apnea     PSH: Past Surgical History  Procedure Laterality Date  . Coronary angioplasty with stent placement  1996  . Coronary angioplasty  1995  . Colonoscopy    . Exploration post operative open heart N/A 09/17/2013    Procedure: EXPLORATION POST OPERATIVE OPEN HEART;  Surgeon: Melrose Nakayama, MD;  Location: [facility];  Service: Open Heart Surgery;  Laterality: N/A;  . Coronary artery bypass graft N/A 09/17/2013    Procedure: CORONARY ARTERY BYPASS GRAFTING (CABG);  Surgeon: Melrose Nakayama, MD;  Location: [facility];  Service: Open Heart Surgery;  Laterality: N/A;  . Cystoscopy with retrograde pyelogram, ureteroscopy and stent placement Right 01/11/2014    Procedure: CYSTOSCOPY WITH RETROGRADE PYELOGRAM, URETEROSCOPY AND STENT  PLACEMENT;  Surgeon: Molli Hazard, MD;  Location: [facility];  Service: Urology;  Laterality: Right;    Allergies: Allergies  Allergen Reactions  . Ace Inhibitors Cough    Medications: No prescriptions prior to admission     Social History: History   Social History  . Marital Status: Married    Spouse Name: N/A    Number of Children: N/A  . Years of Education: N/A   Occupational History  . Not on file.   Social History Main Topics  . Smoking status: Former Research scientist (life sciences)  . Smokeless tobacco: Never Used  . Alcohol Use: No  . Drug Use: No  . Sexual Activity: Not on file   Other Topics Concern  . Not on file   Social History Narrative  . No narrative on file    Family History: No family history on file.  Review of Systems: Positive: None. Negative: Chest pain, SOB, or fever.  A further 10 point review of systems was negative except what is listed in the HPI.  Physical Exam: Filed Vitals:   02/01/14 0934  BP: 121/90  Pulse: 68  Temp: 98 F (36.7 C)  Resp: 18    General: No acute distress.  Awake. Head:  Normocephalic.  Atraumatic. ENT:  EOMI.  Mucous membranes moist Neck:  Supple.  No lymphadenopathy. CV:  S1 present. S2 present. Regular rate. Pulmonary: Equal effort bilaterally.  Clear  to auscultation bilaterally. Abdomen: Soft.  Non- tender to palpation. Skin:  Normal turgor.  No visible rash. Extremity: No gross deformity of bilateral upper extremities.  No gross deformity of    bilateral lower extremities. Neurologic: Alert. Appropriate mood.    Studies:  No results found for this basename: HGB, WBC, PLT,  in the last 72 hours  No results found for this basename: NA, K, CL, CO2, BUN, CREATININE, CALCIUM, MAGNESIUM, GFRNONAA, GFRAA,  in the last 72 hours   No results found for this basename: PT, INR, APTT,  in the last 72 hours   No components found with this basename: ABG,     Assessment:  Right ureter stone. Right kidney  stone.  Plan: To OR for cystoscopy, right ureteroscopy with laser lithotripsy and right retrograde pyelogram, possible right ureter stent exchange.

## 2014-02-04 DIAGNOSIS — N201 Calculus of ureter: Secondary | ICD-10-CM | POA: Diagnosis not present

## 2014-02-05 ENCOUNTER — Encounter (HOSPITAL_COMMUNITY): Payer: Self-pay | Admitting: Urology

## 2014-02-11 ENCOUNTER — Other Ambulatory Visit: Payer: Self-pay | Admitting: Urology

## 2014-02-11 DIAGNOSIS — N2889 Other specified disorders of kidney and ureter: Secondary | ICD-10-CM

## 2014-02-11 DIAGNOSIS — N289 Disorder of kidney and ureter, unspecified: Secondary | ICD-10-CM | POA: Diagnosis not present

## 2014-02-11 DIAGNOSIS — N200 Calculus of kidney: Secondary | ICD-10-CM | POA: Diagnosis not present

## 2014-02-11 DIAGNOSIS — N201 Calculus of ureter: Secondary | ICD-10-CM | POA: Diagnosis not present

## 2014-02-19 ENCOUNTER — Ambulatory Visit
Admission: RE | Admit: 2014-02-19 | Discharge: 2014-02-19 | Disposition: A | Discharge status: Home / Self Care | Payer: Medicare Other | Source: Ambulatory Visit | Attending: Urology | Admitting: Urology

## 2014-02-19 DIAGNOSIS — N2889 Other specified disorders of kidney and ureter: Secondary | ICD-10-CM | POA: Diagnosis not present

## 2014-02-19 DIAGNOSIS — K8070 Calculus of gallbladder and bile duct without cholecystitis without obstruction: Secondary | ICD-10-CM | POA: Diagnosis not present

## 2014-02-21 ENCOUNTER — Other Ambulatory Visit (HOSPITAL_COMMUNITY): Payer: Self-pay | Admitting: Urology

## 2014-02-21 DIAGNOSIS — N289 Disorder of kidney and ureter, unspecified: Secondary | ICD-10-CM

## 2014-02-22 ENCOUNTER — Ambulatory Visit (HOSPITAL_COMMUNITY)
Admission: RE | Admit: 2014-02-22 | Discharge: 2014-02-22 | Disposition: A | Discharge status: Home / Self Care | Payer: Medicare Other | Source: Ambulatory Visit | Attending: Urology | Admitting: Urology

## 2014-02-22 DIAGNOSIS — N289 Disorder of kidney and ureter, unspecified: Secondary | ICD-10-CM | POA: Insufficient documentation

## 2014-02-27 DIAGNOSIS — D631 Anemia in chronic kidney disease: Secondary | ICD-10-CM | POA: Diagnosis not present

## 2014-02-27 DIAGNOSIS — N183 Chronic kidney disease, stage 3 unspecified: Secondary | ICD-10-CM | POA: Diagnosis not present

## 2014-02-27 DIAGNOSIS — N039 Chronic nephritic syndrome with unspecified morphologic changes: Secondary | ICD-10-CM | POA: Diagnosis not present

## 2014-03-07 DIAGNOSIS — K8050 Calculus of bile duct without cholangitis or cholecystitis without obstruction: Secondary | ICD-10-CM | POA: Diagnosis not present

## 2014-03-07 DIAGNOSIS — N200 Calculus of kidney: Secondary | ICD-10-CM | POA: Diagnosis not present

## 2014-03-07 DIAGNOSIS — N289 Disorder of kidney and ureter, unspecified: Secondary | ICD-10-CM | POA: Diagnosis not present

## 2014-03-18 DIAGNOSIS — N183 Chronic kidney disease, stage 3 unspecified: Secondary | ICD-10-CM | POA: Diagnosis not present

## 2014-03-19 DIAGNOSIS — N189 Chronic kidney disease, unspecified: Secondary | ICD-10-CM | POA: Diagnosis not present

## 2014-03-19 DIAGNOSIS — M1A00X0 Idiopathic chronic gout, unspecified site, without tophus (tophi): Secondary | ICD-10-CM | POA: Diagnosis not present

## 2014-03-19 DIAGNOSIS — I10 Essential (primary) hypertension: Secondary | ICD-10-CM | POA: Diagnosis not present

## 2014-04-01 ENCOUNTER — Encounter: Payer: Self-pay | Admitting: Internal Medicine

## 2014-04-02 DIAGNOSIS — N200 Calculus of kidney: Secondary | ICD-10-CM | POA: Diagnosis not present

## 2014-04-09 DIAGNOSIS — N200 Calculus of kidney: Secondary | ICD-10-CM | POA: Diagnosis not present

## 2014-05-10 ENCOUNTER — Ambulatory Visit: Payer: Medicare Other | Admitting: Internal Medicine

## 2014-05-11 ENCOUNTER — Encounter: Payer: Self-pay | Admitting: Internal Medicine

## 2014-05-13 DIAGNOSIS — I10 Essential (primary) hypertension: Secondary | ICD-10-CM | POA: Diagnosis not present

## 2014-05-13 DIAGNOSIS — Z87442 Personal history of urinary calculi: Secondary | ICD-10-CM | POA: Diagnosis not present

## 2014-05-13 DIAGNOSIS — M109 Gout, unspecified: Secondary | ICD-10-CM | POA: Diagnosis not present

## 2014-05-13 DIAGNOSIS — E669 Obesity, unspecified: Secondary | ICD-10-CM | POA: Diagnosis not present

## 2014-05-13 DIAGNOSIS — R7301 Impaired fasting glucose: Secondary | ICD-10-CM | POA: Diagnosis not present

## 2014-05-13 DIAGNOSIS — R079 Chest pain, unspecified: Secondary | ICD-10-CM | POA: Diagnosis not present

## 2014-05-13 DIAGNOSIS — I2510 Atherosclerotic heart disease of native coronary artery without angina pectoris: Secondary | ICD-10-CM | POA: Diagnosis not present

## 2014-05-13 DIAGNOSIS — E785 Hyperlipidemia, unspecified: Secondary | ICD-10-CM | POA: Diagnosis not present

## 2014-05-16 DIAGNOSIS — I119 Hypertensive heart disease without heart failure: Secondary | ICD-10-CM | POA: Diagnosis not present

## 2014-05-16 DIAGNOSIS — I252 Old myocardial infarction: Secondary | ICD-10-CM | POA: Diagnosis not present

## 2014-05-16 DIAGNOSIS — I2510 Atherosclerotic heart disease of native coronary artery without angina pectoris: Secondary | ICD-10-CM | POA: Diagnosis not present

## 2014-05-16 DIAGNOSIS — E785 Hyperlipidemia, unspecified: Secondary | ICD-10-CM | POA: Diagnosis not present

## 2014-05-16 DIAGNOSIS — G4730 Sleep apnea, unspecified: Secondary | ICD-10-CM | POA: Diagnosis not present

## 2014-05-16 DIAGNOSIS — I4891 Unspecified atrial fibrillation: Secondary | ICD-10-CM | POA: Diagnosis not present

## 2014-05-16 DIAGNOSIS — N183 Chronic kidney disease, stage 3 unspecified: Secondary | ICD-10-CM | POA: Diagnosis not present

## 2014-05-16 DIAGNOSIS — R0789 Other chest pain: Secondary | ICD-10-CM | POA: Diagnosis not present

## 2014-05-16 DIAGNOSIS — E669 Obesity, unspecified: Secondary | ICD-10-CM | POA: Diagnosis not present

## 2014-05-28 DIAGNOSIS — I2510 Atherosclerotic heart disease of native coronary artery without angina pectoris: Secondary | ICD-10-CM | POA: Diagnosis not present

## 2014-05-28 DIAGNOSIS — E039 Hypothyroidism, unspecified: Secondary | ICD-10-CM | POA: Diagnosis not present

## 2014-05-28 DIAGNOSIS — N183 Chronic kidney disease, stage 3 unspecified: Secondary | ICD-10-CM | POA: Diagnosis not present

## 2014-05-28 DIAGNOSIS — N039 Chronic nephritic syndrome with unspecified morphologic changes: Secondary | ICD-10-CM | POA: Diagnosis not present

## 2014-05-28 DIAGNOSIS — D631 Anemia in chronic kidney disease: Secondary | ICD-10-CM | POA: Diagnosis not present

## 2014-08-06 DIAGNOSIS — Z23 Encounter for immunization: Secondary | ICD-10-CM | POA: Diagnosis not present

## 2014-08-06 DIAGNOSIS — Z283 Underimmunization status: Secondary | ICD-10-CM | POA: Diagnosis not present

## 2014-08-16 DIAGNOSIS — N2581 Secondary hyperparathyroidism of renal origin: Secondary | ICD-10-CM | POA: Diagnosis not present

## 2014-08-16 DIAGNOSIS — E039 Hypothyroidism, unspecified: Secondary | ICD-10-CM | POA: Diagnosis not present

## 2014-08-16 DIAGNOSIS — N183 Chronic kidney disease, stage 3 (moderate): Secondary | ICD-10-CM | POA: Diagnosis not present

## 2014-08-16 DIAGNOSIS — N189 Chronic kidney disease, unspecified: Secondary | ICD-10-CM | POA: Diagnosis not present

## 2014-08-16 DIAGNOSIS — D631 Anemia in chronic kidney disease: Secondary | ICD-10-CM | POA: Diagnosis not present

## 2014-08-21 ENCOUNTER — Other Ambulatory Visit (HOSPITAL_COMMUNITY): Payer: Self-pay | Admitting: Urology

## 2014-08-21 DIAGNOSIS — N2889 Other specified disorders of kidney and ureter: Secondary | ICD-10-CM

## 2014-08-27 DIAGNOSIS — N183 Chronic kidney disease, stage 3 (moderate): Secondary | ICD-10-CM | POA: Diagnosis not present

## 2014-08-27 DIAGNOSIS — I119 Hypertensive heart disease without heart failure: Secondary | ICD-10-CM | POA: Diagnosis not present

## 2014-08-27 DIAGNOSIS — I2510 Atherosclerotic heart disease of native coronary artery without angina pectoris: Secondary | ICD-10-CM | POA: Diagnosis not present

## 2014-09-12 ENCOUNTER — Other Ambulatory Visit: Payer: Self-pay | Admitting: Dermatology

## 2014-09-12 ENCOUNTER — Encounter (HOSPITAL_COMMUNITY): Payer: Self-pay | Admitting: Cardiology

## 2014-09-12 DIAGNOSIS — L821 Other seborrheic keratosis: Secondary | ICD-10-CM | POA: Diagnosis not present

## 2014-09-12 DIAGNOSIS — D485 Neoplasm of uncertain behavior of skin: Secondary | ICD-10-CM | POA: Diagnosis not present

## 2014-09-12 DIAGNOSIS — C4441 Basal cell carcinoma of skin of scalp and neck: Secondary | ICD-10-CM | POA: Diagnosis not present

## 2014-09-12 DIAGNOSIS — L820 Inflamed seborrheic keratosis: Secondary | ICD-10-CM | POA: Diagnosis not present

## 2014-09-16 ENCOUNTER — Ambulatory Visit (HOSPITAL_COMMUNITY)
Admission: RE | Admit: 2014-09-16 | Discharge: 2014-09-16 | Disposition: A | Discharge status: Home / Self Care | Payer: Medicare Other | Source: Ambulatory Visit | Attending: Urology | Admitting: Urology

## 2014-09-16 DIAGNOSIS — N2889 Other specified disorders of kidney and ureter: Secondary | ICD-10-CM | POA: Diagnosis not present

## 2014-09-16 DIAGNOSIS — N289 Disorder of kidney and ureter, unspecified: Secondary | ICD-10-CM | POA: Diagnosis not present

## 2014-09-16 DIAGNOSIS — N281 Cyst of kidney, acquired: Secondary | ICD-10-CM | POA: Diagnosis not present

## 2014-09-19 DIAGNOSIS — N281 Cyst of kidney, acquired: Secondary | ICD-10-CM | POA: Diagnosis not present

## 2014-09-19 DIAGNOSIS — N200 Calculus of kidney: Secondary | ICD-10-CM | POA: Diagnosis not present

## 2014-09-19 DIAGNOSIS — N2889 Other specified disorders of kidney and ureter: Secondary | ICD-10-CM | POA: Diagnosis not present

## 2014-09-19 DIAGNOSIS — N183 Chronic kidney disease, stage 3 (moderate): Secondary | ICD-10-CM | POA: Diagnosis not present

## 2014-10-02 DIAGNOSIS — C4441 Basal cell carcinoma of skin of scalp and neck: Secondary | ICD-10-CM | POA: Diagnosis not present

## 2014-10-07 IMAGING — CR DG CHEST 1V PORT
1 series · 1 of 1 positions shown · non-contrast
Comparison: 09/17/2013

CLINICAL DATA: Postop CABG

EXAM:
PORTABLE CHEST - 1 VIEW

[AP]
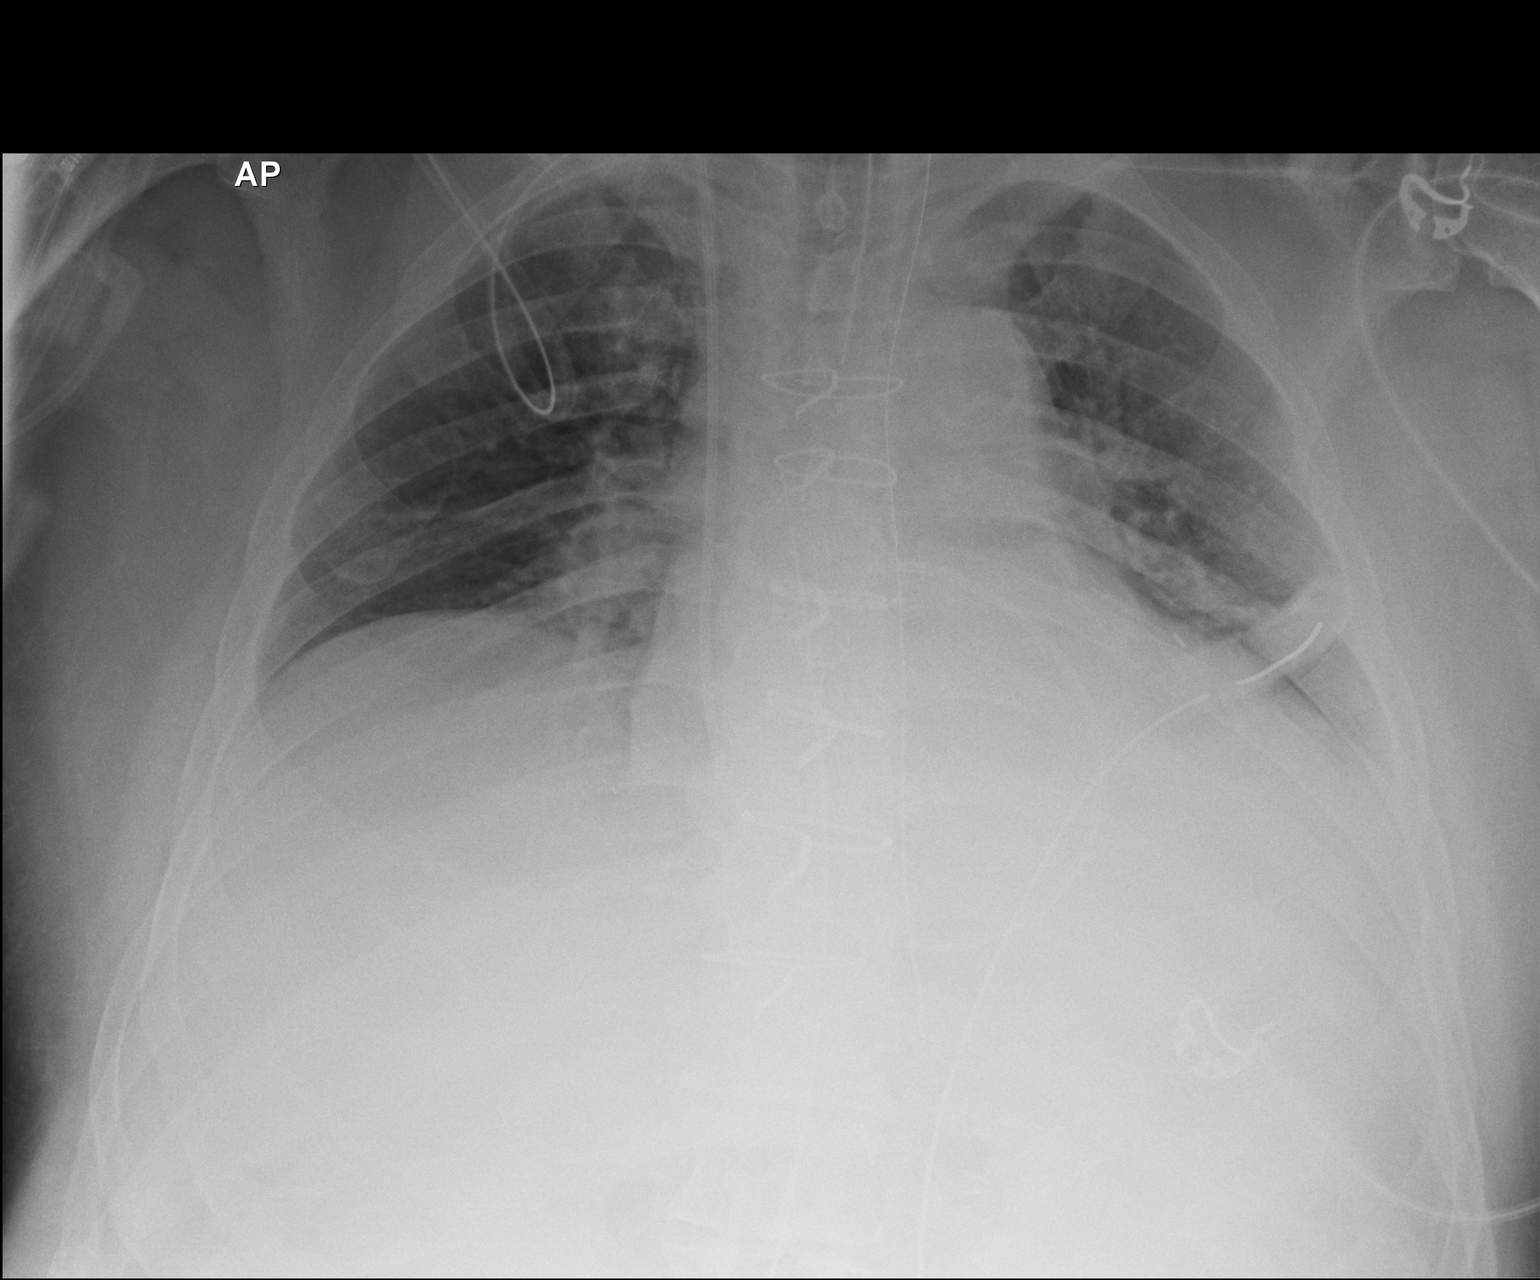

[1 of 1 positions shown; findings below may reference images not displayed]

FINDINGS: Low lung volumes. Mild stable medial lung base atelectasis. No
evidence of an infiltrate or edema.

No mediastinal widening.

Orogastric tube tip is stable near the GE junction, likely in the
proximal stomach.

Endotracheal tube, right internal jugular Swan-Ganz catheter,
mediastinal tube and left inferior hemi thorax chest tube are
stable.

No pneumothorax on this semi-erect study.
IMPRESSION: 1. No change from the previous day's study.
2. Mild medial lung base atelectasis. No evidence of an operative
complication.

## 2014-10-09 IMAGING — CR DG CHEST 1V PORT
1 series · 1 of 1 positions shown · non-contrast
Comparison: 09/19/2013

CLINICAL DATA: Post cardiac surgery.

EXAM:
PORTABLE CHEST - 1 VIEW

[AP]
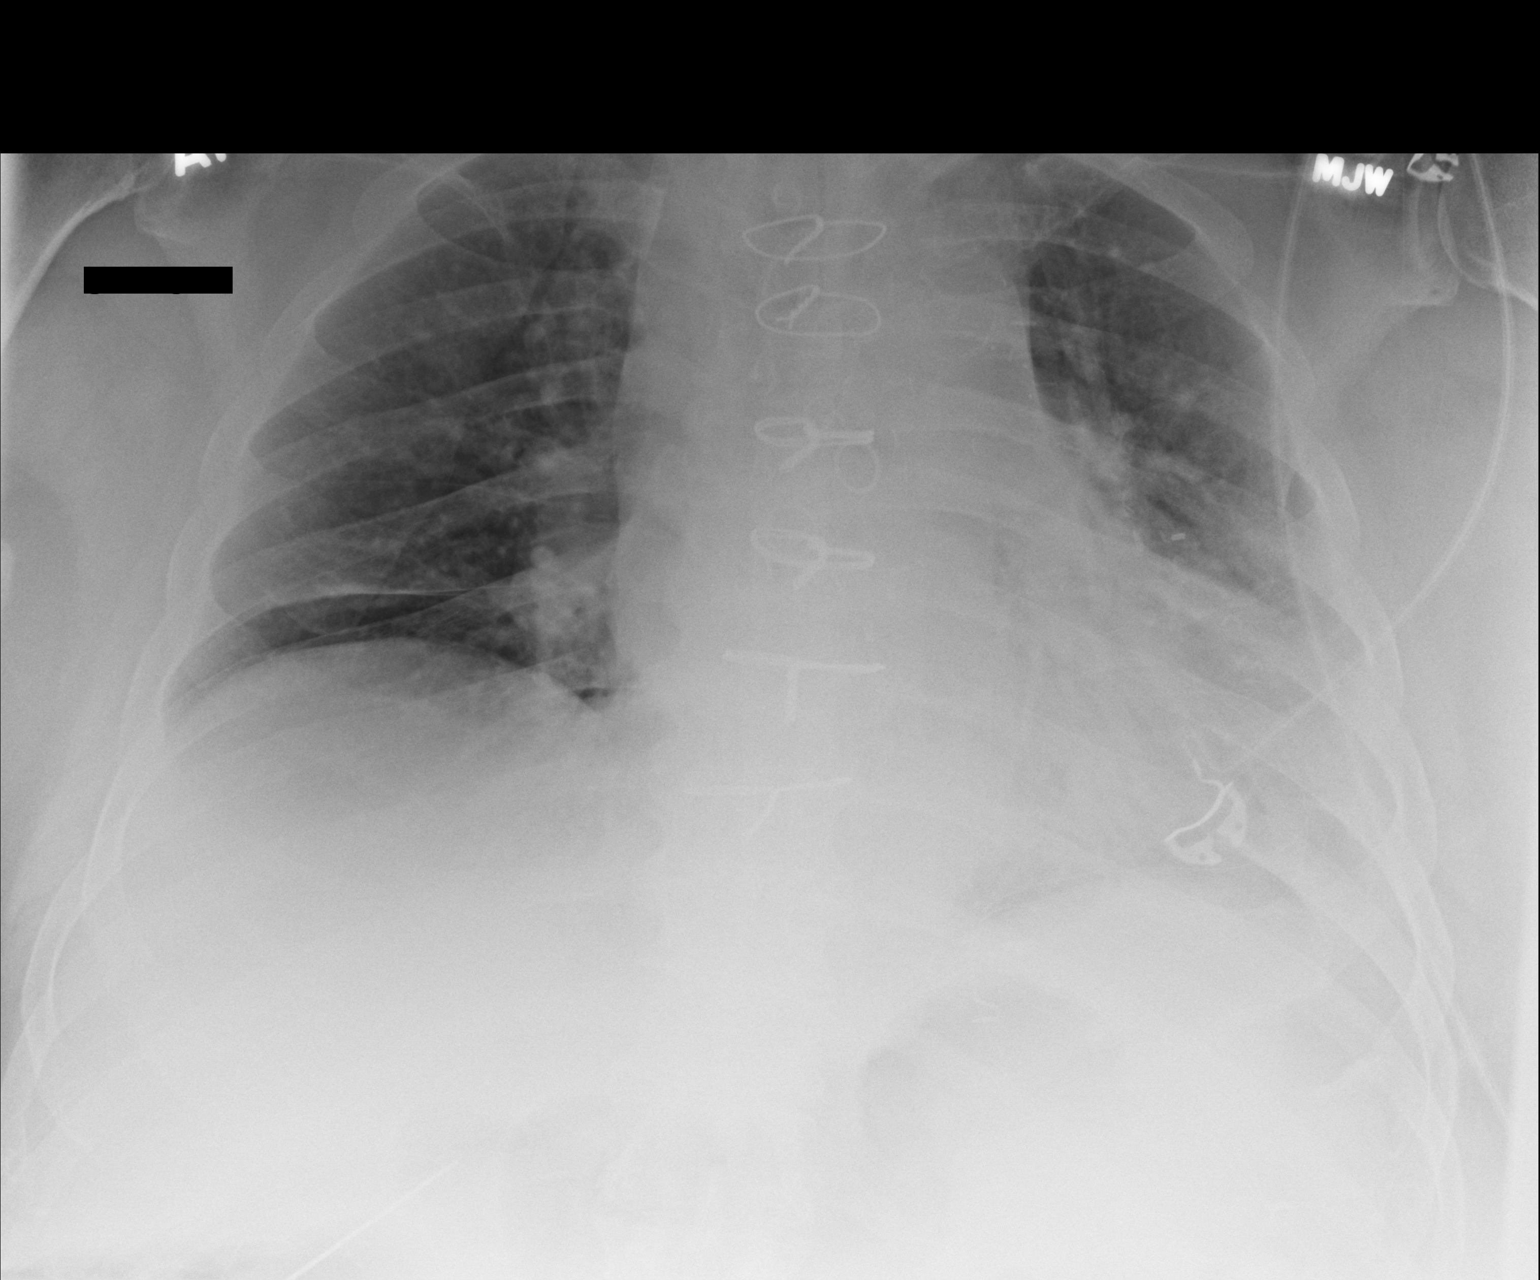

[1 of 1 positions shown; findings below may reference images not displayed]

FINDINGS: Mild elevation of the right hemidiaphragm is unchanged. Stable
enlargement of the cardiac silhouette. Stable position of the right
jugular central venous catheter. Patient continues to have low lung
volumes and densities at the left costophrenic angle. Left basilar
densities could represent atelectasis and a small amount of pleural
fluid. No evidence for pulmonary edema
IMPRESSION: Stable chest radiograph findings. Left basilar densities may
represent a combination of atelectasis and small effusion.

## 2014-10-10 IMAGING — DX DG CHEST 1V PORT
1 series · 1 of 1 positions shown · non-contrast
Comparison: 09/20/2013

CLINICAL DATA: Status post CABG.

EXAM:
PORTABLE CHEST - 1 VIEW

[portable]
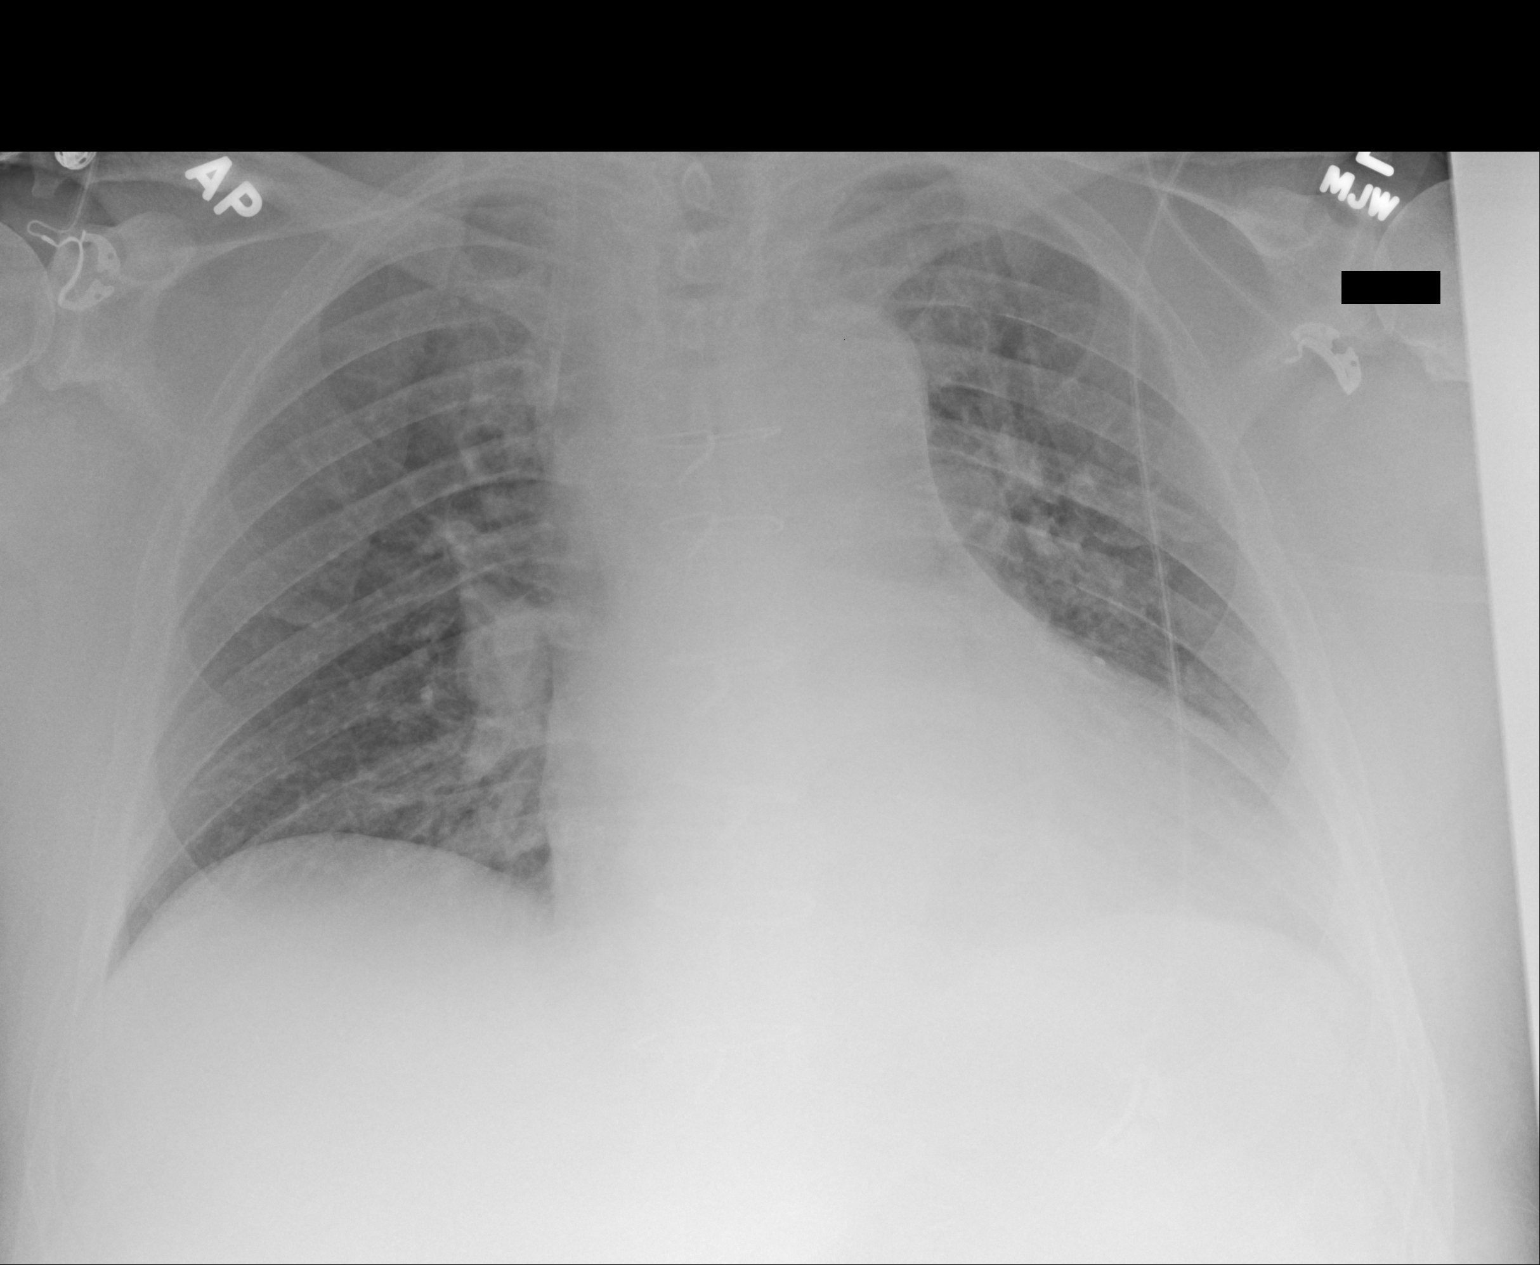

[1 of 1 positions shown; findings below may reference images not displayed]

FINDINGS: Heart size and mediastinal contours are stable. Lung expansion
improved bilaterally with some residual bibasilar atelectasis
remaining. No edema or significant pleural fluid is identified. No
pneumothorax is seen.
IMPRESSION: Improved pulmonary aeration.  No pneumothorax.

## 2014-12-11 ENCOUNTER — Ambulatory Visit (AMBULATORY_SURGERY_CENTER): Payer: Self-pay

## 2014-12-11 VITALS — Ht 69.0 in | Wt 255.0 lb

## 2014-12-11 DIAGNOSIS — Z860100 Personal history of colon polyps, unspecified: Secondary | ICD-10-CM

## 2014-12-11 MED ORDER — MOVIPREP 100 G PO SOLR
1.0000 | Freq: Once | ORAL | Status: DC
Start: 2014-12-11 — End: 2014-12-25

## 2014-12-11 NOTE — Progress Notes (Signed)
No allergies to eggs or soy No diet/weight loss meds No home oxygen No past problems with anesthesia  Has email  Emmi instructions given for colonoscopy 

## 2014-12-23 ENCOUNTER — Other Ambulatory Visit: Payer: Self-pay | Admitting: Neurosurgery

## 2014-12-23 DIAGNOSIS — M5412 Radiculopathy, cervical region: Secondary | ICD-10-CM

## 2014-12-25 ENCOUNTER — Ambulatory Visit (AMBULATORY_SURGERY_CENTER): Payer: PPO | Admitting: Internal Medicine

## 2014-12-25 ENCOUNTER — Encounter: Payer: Self-pay | Admitting: Internal Medicine

## 2014-12-25 VITALS — BP 117/84 | HR 75 | Temp 97.6°F | Resp 15 | Ht 69.0 in | Wt 255.0 lb

## 2014-12-25 DIAGNOSIS — D123 Benign neoplasm of transverse colon: Secondary | ICD-10-CM

## 2014-12-25 DIAGNOSIS — Z86010 Personal history of colonic polyps: Secondary | ICD-10-CM | POA: Diagnosis not present

## 2014-12-25 HISTORY — PX: COLONOSCOPY: SHX174

## 2014-12-25 MED ORDER — SODIUM CHLORIDE 0.9 % IV SOLN: 500.0000 mL | INTRAVENOUS | Status: DC

## 2014-12-25 NOTE — Progress Notes (Signed)
Called to room to assist during endoscopic procedure.  Patient ID and intended procedure confirmed with present staff. Received instructions for my participation in the procedure from the performing physician.  

## 2014-12-25 NOTE — Op Note (Signed)
[place]  [address] [place], [id number]   COLONOSCOPY PROCEDURE REPORT  PATIENT: Dalton Mitchell, Dalton Mitchell  MR#: [id number] BIRTHDATE: 07-05-1946 , 8  yrs. old GENDER: male ENDOSCOPIST: Eustace Quail, MD REFERRED [id number] Program Recall PROCEDURE DATE:  12/25/2014 PROCEDURE:   Colonoscopy, surveillance and Colonoscopy with snare polypectomy x 1 First Screening Colonoscopy - Avg.  risk and is 50 yrs.  old or older - No.  Prior Negative Screening - Now for repeat screening. N/A  History of Adenoma - Now for follow-up colonoscopy & has been > or = to 3 yrs.  Yes hx of adenoma.  Has been 3 or more years since last colonoscopy. ASA CLASS:   Class II INDICATIONS:Surveillance due to prior colonic neoplasia and PH Colon Adenoma.   . 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  The digital rectal exam revealed no abnormalities of the rectum.   The LB [id number]  endoscope was introduced through the anus and advanced to the cecum, which was identified by both the appendix and ileocecal valve. No adverse events experienced.   The quality of the prep was excellent. (MoviPrep was used)  The instrument was then slowly withdrawn as the colon was fully examined.    COLON FINDINGS: A single polyp measuring 1 mm in size was found in the transverse colon.  A polypectomy was performed with a cold snare.  The resection was complete, the polyp tissue was completely retrieved and sent to histology.   The examination was otherwise normal.  Retroflexed views revealed internal hemorrhoids. The time to cecum = 2.0 Withdrawal time = 14.4   The scope was withdrawn and the procedure  completed. COMPLICATIONS: There were no immediate complications.  ENDOSCOPIC IMPRESSION: 1.   Single polyp was found in the transverse colon; polypectomy was performed with a cold snare 2.   The examination was otherwise normal  RECOMMENDATIONS: 1. Follow up colonoscopy in 5 years  eSigned:  Eustace Quail, MD 12/25/2014 2:19 PM   cc: Janalyn Rouse, MD and The Patient

## 2014-12-25 NOTE — Progress Notes (Signed)
A/ox3 pleased with MAC, report to Wendy RN 

## 2014-12-25 NOTE — Patient Instructions (Signed)
YOU HAD AN ENDOSCOPIC PROCEDURE TODAY AT [facility]:   Refer to the procedure report that was given to you for any specific questions about what was found during the examination.  If the procedure report does not answer your questions, please call your gastroenterologist to clarify.  If you requested that your care partner not be given the details of your procedure findings, then the procedure report has been included in a sealed envelope for you to review at your convenience later.  YOU SHOULD EXPECT: Some feelings of bloating in the abdomen. Passage of more gas than usual.  Walking can help get rid of the air that was put into your GI tract during the procedure and reduce the bloating. If you had a lower endoscopy (such as a colonoscopy or flexible sigmoidoscopy) you may notice spotting of blood in your stool or on the toilet paper. If you underwent a bowel prep for your procedure, you may not have a normal bowel movement for a few days.  Please Note:  You might notice some irritation and congestion in your nose or some drainage.  This is from the oxygen used during your procedure.  There is no need for concern and it should clear up in a day or so.  SYMPTOMS TO REPORT IMMEDIATELY:   Following lower endoscopy (colonoscopy or flexible sigmoidoscopy):  Excessive amounts of blood in the stool  Significant tenderness or worsening of abdominal pains  Swelling of the abdomen that is new, acute  Fever of 100F or higher  For urgent or emergent issues, a gastroenterologist can be reached at any hour by calling [phone].   DIET: Your first meal following the procedure should be a small meal and then it is ok to progress to your normal diet. Heavy or fried foods are harder to digest and may make you feel nauseous or bloated.  Likewise, meals heavy in dairy and vegetables can increase bloating.  Drink plenty of fluids but you should avoid alcoholic beverages for 24  hours.  ACTIVITY:  You should plan to take it easy for the rest of today and you should NOT DRIVE or use heavy machinery until tomorrow (because of the sedation medicines used during the test).    FOLLOW UP: Our staff will call the number listed on your records the next business day following your procedure to check on you and address any questions or concerns that you may have regarding the information given to you following your procedure. If we do not reach you, we will leave a message.  However, if you are feeling well and you are not experiencing any problems, there is no need to return our call.  We will assume that you have returned to your regular daily activities without incident.  If any biopsies were taken you will be contacted by phone or by letter within the next 1-3 weeks.  Please call us at [phone] if you have not heard about the biopsies in 3 weeks.    SIGNATURES/CONFIDENTIALITY: You and/or your care partner have signed paperwork which will be entered into your electronic medical record.  These signatures attest to the fact that that the information above on your After Visit Summary has been reviewed and is understood.  Full responsibility of the confidentiality of this discharge information lies with you and/or your care-partner.  Recommendations Await pathology results; next colonoscopy in 5 years. Polyp handout provided. Discharge instructions given to patient and/or care partner.

## 2014-12-26 ENCOUNTER — Telehealth: Payer: Self-pay | Admitting: *Deleted

## 2014-12-26 NOTE — Telephone Encounter (Signed)
  Follow up Call-  Call back number 12/25/2014  Post procedure Call Back phone  # [phone]  Permission to leave phone message Yes     Patient questions:  Do you have a fever, pain , or abdominal swelling? No. Pain Score  0 *  Have you tolerated food without any problems? Yes.    Have you been able to return to your normal activities? Yes.    Do you have any questions about your discharge instructions: Diet   No. Medications  No. Follow up visit  No.  Do you have questions or concerns about your Care? No.  Actions: * If pain score is 4 or above: No action needed, pain <4.

## 2014-12-31 ENCOUNTER — Encounter: Payer: Self-pay | Admitting: Internal Medicine

## 2015-03-13 ENCOUNTER — Encounter: Payer: Self-pay | Admitting: Gastroenterology

## 2015-03-13 ENCOUNTER — Encounter: Payer: Self-pay | Admitting: Internal Medicine

## 2015-09-01 ENCOUNTER — Other Ambulatory Visit: Payer: Self-pay | Admitting: Urology

## 2015-09-01 DIAGNOSIS — N2889 Other specified disorders of kidney and ureter: Secondary | ICD-10-CM

## 2015-09-16 ENCOUNTER — Ambulatory Visit (HOSPITAL_COMMUNITY)
Admission: RE | Admit: 2015-09-16 | Discharge: 2015-09-16 | Disposition: A | Discharge status: Home / Self Care | Payer: PPO | Source: Ambulatory Visit | Attending: Urology | Admitting: Urology

## 2015-09-16 DIAGNOSIS — N281 Cyst of kidney, acquired: Secondary | ICD-10-CM | POA: Insufficient documentation

## 2015-09-16 DIAGNOSIS — N2889 Other specified disorders of kidney and ureter: Secondary | ICD-10-CM | POA: Diagnosis present

## 2015-11-19 DIAGNOSIS — I10 Essential (primary) hypertension: Secondary | ICD-10-CM | POA: Diagnosis not present

## 2015-11-19 DIAGNOSIS — Z7982 Long term (current) use of aspirin: Secondary | ICD-10-CM | POA: Diagnosis not present

## 2015-11-19 DIAGNOSIS — Z96651 Presence of right artificial knee joint: Secondary | ICD-10-CM | POA: Diagnosis not present

## 2015-11-19 DIAGNOSIS — J45909 Unspecified asthma, uncomplicated: Secondary | ICD-10-CM | POA: Diagnosis not present

## 2015-11-19 DIAGNOSIS — Z6837 Body mass index (BMI) 37.0-37.9, adult: Secondary | ICD-10-CM | POA: Diagnosis not present

## 2015-11-19 DIAGNOSIS — Z79891 Long term (current) use of opiate analgesic: Secondary | ICD-10-CM | POA: Diagnosis not present

## 2015-11-19 DIAGNOSIS — Z471 Aftercare following joint replacement surgery: Secondary | ICD-10-CM | POA: Diagnosis not present

## 2015-11-19 DIAGNOSIS — Z87891 Personal history of nicotine dependence: Secondary | ICD-10-CM | POA: Diagnosis not present

## 2015-11-19 DIAGNOSIS — Z9181 History of falling: Secondary | ICD-10-CM | POA: Diagnosis not present

## 2015-11-24 DIAGNOSIS — I10 Essential (primary) hypertension: Secondary | ICD-10-CM | POA: Diagnosis not present

## 2015-11-24 DIAGNOSIS — E039 Hypothyroidism, unspecified: Secondary | ICD-10-CM | POA: Diagnosis not present

## 2015-11-24 DIAGNOSIS — M109 Gout, unspecified: Secondary | ICD-10-CM | POA: Diagnosis not present

## 2015-11-24 DIAGNOSIS — R7301 Impaired fasting glucose: Secondary | ICD-10-CM | POA: Diagnosis not present

## 2015-11-24 DIAGNOSIS — Z125 Encounter for screening for malignant neoplasm of prostate: Secondary | ICD-10-CM | POA: Diagnosis not present

## 2015-11-24 DIAGNOSIS — E784 Other hyperlipidemia: Secondary | ICD-10-CM | POA: Diagnosis not present

## 2015-11-24 DIAGNOSIS — N390 Urinary tract infection, site not specified: Secondary | ICD-10-CM | POA: Diagnosis not present

## 2015-11-24 DIAGNOSIS — R8290 Unspecified abnormal findings in urine: Secondary | ICD-10-CM | POA: Diagnosis not present

## 2015-12-20 ENCOUNTER — Emergency Department (HOSPITAL_COMMUNITY): Payer: PPO

## 2015-12-20 ENCOUNTER — Encounter (HOSPITAL_COMMUNITY): Payer: Self-pay

## 2015-12-20 ENCOUNTER — Emergency Department (HOSPITAL_COMMUNITY)
Admission: EM | Admit: 2015-12-20 | Discharge: 2015-12-20 | Disposition: A | Discharge status: Home / Self Care | Payer: PPO | Attending: Emergency Medicine | Admitting: Emergency Medicine

## 2015-12-20 DIAGNOSIS — Z79899 Other long term (current) drug therapy: Secondary | ICD-10-CM | POA: Insufficient documentation

## 2015-12-20 DIAGNOSIS — X58XXXA Exposure to other specified factors, initial encounter: Secondary | ICD-10-CM | POA: Insufficient documentation

## 2015-12-20 DIAGNOSIS — S62630A Displaced fracture of distal phalanx of right index finger, initial encounter for closed fracture: Secondary | ICD-10-CM | POA: Diagnosis not present

## 2015-12-20 DIAGNOSIS — Y9289 Other specified places as the place of occurrence of the external cause: Secondary | ICD-10-CM | POA: Diagnosis not present

## 2015-12-20 DIAGNOSIS — Y998 Other external cause status: Secondary | ICD-10-CM | POA: Insufficient documentation

## 2015-12-20 DIAGNOSIS — I1310 Hypertensive heart and chronic kidney disease without heart failure, with stage 1 through stage 4 chronic kidney disease, or unspecified chronic kidney disease: Secondary | ICD-10-CM | POA: Diagnosis not present

## 2015-12-20 DIAGNOSIS — Z8669 Personal history of other diseases of the nervous system and sense organs: Secondary | ICD-10-CM | POA: Diagnosis not present

## 2015-12-20 DIAGNOSIS — E669 Obesity, unspecified: Secondary | ICD-10-CM | POA: Diagnosis not present

## 2015-12-20 DIAGNOSIS — N183 Chronic kidney disease, stage 3 (moderate): Secondary | ICD-10-CM | POA: Diagnosis not present

## 2015-12-20 DIAGNOSIS — Z86010 Personal history of colonic polyps: Secondary | ICD-10-CM | POA: Diagnosis not present

## 2015-12-20 DIAGNOSIS — E039 Hypothyroidism, unspecified: Secondary | ICD-10-CM | POA: Insufficient documentation

## 2015-12-20 DIAGNOSIS — S61210A Laceration without foreign body of right index finger without damage to nail, initial encounter: Secondary | ICD-10-CM | POA: Diagnosis not present

## 2015-12-20 DIAGNOSIS — Z87442 Personal history of urinary calculi: Secondary | ICD-10-CM | POA: Diagnosis not present

## 2015-12-20 DIAGNOSIS — E785 Hyperlipidemia, unspecified: Secondary | ICD-10-CM | POA: Insufficient documentation

## 2015-12-20 DIAGNOSIS — S62660B Nondisplaced fracture of distal phalanx of right index finger, initial encounter for open fracture: Secondary | ICD-10-CM | POA: Diagnosis not present

## 2015-12-20 DIAGNOSIS — Y9389 Activity, other specified: Secondary | ICD-10-CM | POA: Insufficient documentation

## 2015-12-20 DIAGNOSIS — S62609B Fracture of unspecified phalanx of unspecified finger, initial encounter for open fracture: Secondary | ICD-10-CM

## 2015-12-20 DIAGNOSIS — Z7982 Long term (current) use of aspirin: Secondary | ICD-10-CM | POA: Insufficient documentation

## 2015-12-20 DIAGNOSIS — S61219A Laceration without foreign body of unspecified finger without damage to nail, initial encounter: Secondary | ICD-10-CM

## 2015-12-20 DIAGNOSIS — Z23 Encounter for immunization: Secondary | ICD-10-CM | POA: Insufficient documentation

## 2015-12-20 DIAGNOSIS — Z87891 Personal history of nicotine dependence: Secondary | ICD-10-CM | POA: Diagnosis not present

## 2015-12-20 DIAGNOSIS — S61310A Laceration without foreign body of right index finger with damage to nail, initial encounter: Secondary | ICD-10-CM | POA: Diagnosis not present

## 2015-12-20 MED ORDER — CEPHALEXIN 500 MG PO CAPS: 500.0000 mg | ORAL_CAPSULE | Freq: Two times a day (BID) | ORAL | Status: DC

## 2015-12-20 MED ORDER — LIDOCAINE HCL 2 % IJ SOLN
10.0000 mL | Freq: Once | INTRAMUSCULAR | Status: AC
  Administered 2015-12-20: 200 mg via INTRADERMAL
  Filled 2015-12-20: qty 20

## 2015-12-20 MED ORDER — TETANUS-DIPHTH-ACELL PERTUSSIS 5-2.5-18.5 LF-MCG/0.5 IM SUSP
0.5000 mL | Freq: Once | INTRAMUSCULAR | Status: AC
  Administered 2015-12-20: 0.5 mL via INTRAMUSCULAR
  Filled 2015-12-20: qty 0.5

## 2015-12-20 MED ORDER — OXYCODONE-ACETAMINOPHEN 5-325 MG PO TABS: 2.0000 | ORAL_TABLET | ORAL | Status: DC | PRN

## 2015-12-20 MED ORDER — CEPHALEXIN 250 MG PO CAPS
500.0000 mg | ORAL_CAPSULE | Freq: Once | ORAL | Status: AC
  Administered 2015-12-20: 500 mg via ORAL
  Filled 2015-12-20: qty 2

## 2015-12-20 NOTE — ED Notes (Signed)
Lac to tip of right index finger. Bleeding controlled by EMT has finger wrapped with pressure dressing.  Pt was loading piece of equipment on trailer and hand got cut by tractor that another person made move.

## 2015-12-20 NOTE — Discharge Instructions (Signed)
Finger Fracture Removed finger splint after 48 hours. Follow-up with orthopedics in 3 days. Take antibiotics as prescribed. Take Motrin for pain and Percocet for breakthrough pain. Return for fever, surrounding erythema, or drainage from the wound. Fractures of fingers are breaks in the bones of the fingers. There are many types of fractures. There are different ways of treating these fractures. Your health care provider will discuss the best way to treat your fracture. CAUSES Traumatic injury is the main cause of broken fingers. These include:  Injuries while playing sports.  Workplace injuries.  Falls. RISK FACTORS Activities that can increase your risk of finger fractures include:  Sports.  Workplace activities that involve machinery.  A condition called osteoporosis, which can make your bones less dense and cause them to fracture more easily. SIGNS AND SYMPTOMS The main symptoms of a broken finger are pain and swelling within 15 minutes after the injury. Other symptoms include:  Bruising of your finger.  Stiffness of your finger.  Numbness of your finger.  Exposed bones (compound fracture) if the fracture is severe. DIAGNOSIS  The best way to diagnose a broken bone is with X-ray imaging. Additionally, your health care provider will use this X-ray image to evaluate the position of the broken finger bones.  TREATMENT  Finger fractures can be treated with:   Nonreduction--This means the bones are in place. The finger is splinted without changing the positions of the bone pieces. The splint is usually left on for about a week to 10 days. This will depend on your fracture and what your health care provider thinks.  Closed reduction--The bones are put back into position without using surgery. The finger is then splinted.  Open reduction and internal fixation--The fracture site is opened. Then the bone pieces are fixed into place with pins or some type of hardware. This is seldom  required. It depends on the severity of the fracture. HOME CARE INSTRUCTIONS   Follow your health care provider's instructions regarding activities, exercises, and physical therapy.  Only take over-the-counter or prescription medicines for pain, discomfort, or fever as directed by your health care provider. SEEK MEDICAL CARE IF: You have pain or swelling that limits the motion or use of your fingers. SEEK IMMEDIATE MEDICAL CARE IF:  Your finger becomes numb. MAKE SURE YOU:   Understand these instructions.  Will watch your condition.  Will get help right away if you are not doing well or get worse.   This information is not intended to replace advice given to you by your health care provider. Make sure you discuss any questions you have with your health care provider.   Document Released: 01/02/2001 Document Revised: 07/11/2013 Document Reviewed: 05/02/2013 Elsevier Interactive Patient Education 2016 [place], Adult A laceration is a cut that goes through all of the layers of the skin and into the tissue that is right under the skin. Some lacerations heal on their own. Others need to be closed with stitches (sutures), staples, skin adhesive strips, or skin glue. Proper laceration care minimizes the risk of infection and helps the laceration to heal better. HOW TO CARE FOR YOUR LACERATION If sutures or staples were used:  Keep the wound clean and dry.  If you were given a bandage (dressing), you should change it at least one time per day or as told by your health care provider. You should also change it if it becomes wet or dirty.  Keep the wound completely dry for the first 24  hours or as told by your health care provider. After that time, you may shower or bathe. However, make sure that the wound is not soaked in water until after the sutures or staples have been removed.  Clean the wound one time each day or as told by your health care provider:  Wash the  wound with soap and water.  Rinse the wound with water to remove all soap.  Pat the wound dry with a clean towel. Do not rub the wound.  After cleaning the wound, apply a thin layer of antibiotic ointmentas told by your health care provider. This will help to prevent infection and keep the dressing from sticking to the wound.  Have the sutures or staples removed as told by your health care provider. If skin adhesive strips were used:  Keep the wound clean and dry.  If you were given a bandage (dressing), you should change it at least one time per day or as told by your health care provider. You should also change it if it becomes dirty or wet.  Do not get the skin adhesive strips wet. You may shower or bathe, but be careful to keep the wound dry.  If the wound gets wet, pat it dry with a clean towel. Do not rub the wound.  Skin adhesive strips fall off on their own. You may trim the strips as the wound heals. Do not remove skin adhesive strips that are still stuck to the wound. They will fall off in time. If skin glue was used:  Try to keep the wound dry, but you may briefly wet it in the shower or bath. Do not soak the wound in water, such as by swimming.  After you have showered or bathed, gently pat the wound dry with a clean towel. Do not rub the wound.  Do not do any activities that will make you sweat heavily until the skin glue has fallen off on its own.  Do not apply liquid, cream, or ointment medicine to the wound while the skin glue is in place. Using those may loosen the film before the wound has healed.  If you were given a bandage (dressing), you should change it at least one time per day or as told by your health care provider. You should also change it if it becomes dirty or wet.  If a dressing is placed over the wound, be careful not to apply tape directly over the skin glue. Doing that may cause the glue to be pulled off before the wound has healed.  Do not pick at  the glue. The skin glue usually remains in place for 5-10 days, then it falls off of the skin. General Instructions  Take over-the-counter and prescription medicines only as told by your health care provider.  If you were prescribed an antibiotic medicine or ointment, take or apply it as told by your doctor. Do not stop using it even if your condition improves.  To help prevent scarring, make sure to cover your wound with sunscreen whenever you are outside after stitches are removed, after adhesive strips are removed, or when glue remains in place and the wound is healed. Make sure to wear a sunscreen of at least 30 SPF.  Do not scratch or pick at the wound.  Keep all follow-up visits as told by your health care provider. This is important.  Check your wound every day for signs of infection. Watch for:  Redness, swelling, or pain.  Fluid,  blood, or pus.  Raise (elevate) the injured area above the level of your heart while you are sitting or lying down, if possible. SEEK MEDICAL CARE IF:  You received a tetanus shot and you have swelling, severe pain, redness, or bleeding at the injection site.  You have a fever.  A wound that was closed breaks open.  You notice a bad smell coming from your wound or your dressing.  You notice something coming out of the wound, such as wood or glass.  Your pain is not controlled with medicine.  You have increased redness, swelling, or pain at the site of your wound.  You have fluid, blood, or pus coming from your wound.  You notice a change in the color of your skin near your wound.  You need to change the dressing frequently due to fluid, blood, or pus draining from the wound.  You develop a new rash.  You develop numbness around the wound. SEEK IMMEDIATE MEDICAL CARE IF:  You develop severe swelling around the wound.  Your pain suddenly increases and is severe.  You develop painful lumps near the wound or on skin that is anywhere on  your body.  You have a red streak going away from your wound.  The wound is on your hand or foot and you cannot properly move a finger or toe.  The wound is on your hand or foot and you notice that your fingers or toes look pale or bluish.   This information is not intended to replace advice given to you by your health care provider. Make sure you discuss any questions you have with your health care provider.   Document Released: 09/20/2005 Document Revised: 02/04/2015 Document Reviewed: 09/16/2014 Elsevier Interactive Patient Education [employer].

## 2015-12-20 NOTE — ED Notes (Signed)
Declined W/C at D/C and was escorted to lobby by RN. 

## 2015-12-20 NOTE — ED Provider Notes (Signed)
  Physical Exam  BP 139/102 mmHg  Pulse 83  Temp(Src) 98 F (36.7 C) (Oral)  Resp 16  Ht 5\' 9"  (1.753 m)  Wt 120.203 kg  BMI 39.12 kg/m2  SpO2 97%  Physical Exam Skin: 1.5cm Laceration through the second right distal finger over the first and second distal phalanx and through the nail bed. Nail is intact but not attached. Patient brought in nail. No active bleeding. No bone could be visualized. Able to flex and extend the finger. 2+ radial pulse. Normal sensation. ED Course  Procedures  Patient was signed out to me by Delrae Rend, PA-C. Patient has lack to the right index finger with maceration of the nailbed and tip of the finger. This was discussed with Dr. Lacinda Axon prior to me evaluating the patient. He recommended one suture at the edge of the laceration. X-ray is pending. Orthopedics will need to be consultation after x-ray results.  NERVE BLOCK Performed by: Ottie Glazier Consent: Verbal consent obtained. Required items: required blood products, implants, devices, and special equipment available Time out: Immediately prior to procedure a "time out" was called to verify the correct patient, procedure, equipment, support staff and site/side marked as required. Indication: Pain control and irrigation  Nerve block body site: 2nd right finger Preparation: Patient was prepped and draped in the usual sterile fashion. Needle gauge: 25 G Location technique: anatomical landmarks Local anesthetic: Lidocaine 2% without epinephrine Anesthetic total: 8 ml Outcome: pain improved Patient tolerance: Patient tolerated the procedure well with no immediate complications.  LACERATION REPAIR Performed by: Ottie Glazier Authorized by: Ottie Glazier Consent: Verbal consent obtained. Risks and benefits: risks, benefits and alternatives were discussed Consent given by: patient Patient identity confirmed: provided demographic data Prepped and Draped in normal sterile fashion Wound  explored Laceration Location: Second right distal finger  Laceration Length: 1.5 cm No Foreign Bodies seen or palpated Anesthesia: local infiltration Local anesthetic: lidocaine 2% without epinephrine Anesthetic total: See digital block note above Irrigation method: syringe Amount of cleaning: copious amounts of cleaning Skin closure: 5-0 chromic gut Number of sutures: 2 Technique: Simple interupted Patient tolerance: Patient tolerated the procedure well with no immediate complications. Medications  Tdap (BOOSTRIX) injection 0.5 mL (0.5 mLs Intramuscular Given 12/20/15 1622)  lidocaine (XYLOCAINE) 2 % (with pres) injection 200 mg (200 mg Intradermal Given 12/20/15 1622)  cephALEXin (KEFLEX) capsule 500 mg (500 mg Oral Given 12/20/15 1828)    I spoke to Dr. Caralyn Guile who recommended placing the nail back onto the nailbed but without sutures to secure nail. I placed 2 sutures within the nailbed to close the wound. Patient was put on antibiotics. Wound was covered with Xeroform and dressing was applied. He was placed in finger splint for the next 24 hours.  I discussed calling orthopedics in 2 days for follow-up appointment. He was given Percocet for pain. Return precautions were discussed thoroughly. Patient agrees the plan.        Ottie Glazier, PA-C 12/20/15 1857  Medical screening examination/treatment/procedure(s) were conducted as a shared visit with non-physician practitioner(s) and myself.  I personally evaluated the patient during the encounter.   EKG Interpretation None     Status post crush injury to right index finger. Plain films show a nondisplaced fracture through the tuft of the distal second phalanx. Wound care discussed with hand surgeon on-call. See excellent physician assistant note. Digital block and repair as above. Follow-up with Dr. Stann Ore, MD 12/21/15 [phone]

## 2015-12-20 NOTE — ED Provider Notes (Signed)
CSN: [id number]     Arrival date & time 12/20/15  1436 History  By signing my name below, I, Soijett Blue, attest that this documentation has been prepared under the direction and in the presence of Eboni Coval Y. Blaize Nipper, PA-C Electronically Signed: Sp[place], ED Scribe. 12/20/2015. 3:45 PM.  Chief Complaint  Patient presents with  . Laceration      The history is provided by the patient. No language interpreter was used.    Dalton Mitchell is a 70 y.o. male who presents to the Emergency Department complaining of right index finger laceration onset PTA. He notes that he was securing a tractor to a trailer when the driver of the tractor reversed and the pt hand got caught in between the tractor and trailer. Pt is not having much pain and he denies pain to anywhere else at this time. Pt is unsure if he is UTD on his tetanus. He states that he has tried wound care with no relief for his symptoms. He denies color change, swelling, numbness, tingling, weakness, and any other symptoms. Denies taking blood thinners at this time.    Past Medical History  Diagnosis Date  . Adenomatous colon polyp   . Old inferior wall myocardial infarction   . Hypothyroidism   . Hyperlipidemia   . Gout   . Hypertensive heart disease   . CAD     Prior inferior MI treated with stent 1995    . GERD 02/24/2008  . 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 History of kidney stones   . Obesity (BMI 30-39.9)   . Sleep apnea    Past Surgical History  Procedure Laterality Date  . Coronary angioplasty with stent placement  1996  . Coronary angioplasty  1995  . Colonoscopy    . Exploration post operative open heart N/A 09/17/2013    Procedure: EXPLORATION POST OPERATIVE OPEN HEART;  Surgeon: Melrose Nakayama, MD;  Location: [place];  Service: Open Heart Surgery;  Laterality: N/A;  . Coronary artery bypass graft N/A 09/17/2013    Procedure: CORONARY ARTERY BYPASS GRAFTING (CABG);  Surgeon: Melrose Nakayama, MD;  Location: [place];  Service: Open Heart Surgery;  Laterality: N/A;  . Cystoscopy with retrograde pyelogram, ureteroscopy and stent placement Right 01/11/2014    Procedure: CYSTOSCOPY WITH RETROGRADE PYELOGRAM, URETEROSCOPY AND STENT PLACEMENT;  Surgeon: Molli Hazard, MD;  Location: [facility];  Service: Urology;  Laterality: Right;  . Cystoscopy with retrograde pyelogram, ureteroscopy and stent placement Right 02/01/2014    Procedure: CYSTOSCOPY WITH RETROGRADE PYELOGRAM, URETEROSCOPY AND STENT EXCHANGE;  Surgeon: Molli Hazard, MD;  Location: [facility];  Service: Urology;  Laterality: Right;  . Holmium laser application Right [id number]    Procedure: HOLMIUM LASER APPLICATION;  Surgeon: Molli Hazard, MD;  Location: [facility];  Service: Urology;  Laterality: Right;  . Left heart catheterization with coronary angiogram N/A 09/14/2013    Procedure: LEFT HEART CATHETERIZATION WITH CORONARY ANGIOGRAM;  Surgeon: Jacolyn Reedy, MD;  Location: [facility] C[facility];  Service: Cardiovascular;  Laterality: N/A;   Family History  Problem Relation Age of Onset  . Colon cancer Maternal Grandmother    Social History  Substance Use Topics  . Smoking status: Former Research scientist (life sciences)  . Smokeless tobacco: Never Used  . Alcohol Use: No    Review of Systems  Musculoskeletal: Positive for arthralgias. Negative for joint swelling.  Skin: Positive for wound. Negative for color change.  Neurological: Negative for  weakness and numbness.       No tingling  All other systems reviewed and are negative.     Allergies  Ace inhibitors  Home Medications   Prior to Admission medications   Medication Sig Start Date End Date Taking? Authorizing Provider  allopurinol (ZYLOPRIM) 300 MG tablet Take 300 mg by mouth at bedtime.     Historical Provider, MD  ALPRAZolam Duanne Moron) 0.5 MG tablet Take 0.5 mg by mouth 2 (two) times daily as needed for anxiety.    Historical Provider, MD  aspirin EC 325 MG tablet  Take 325 mg by mouth at bedtime.    Historical Provider, MD  atorvastatin (LIPITOR) 40 MG tablet Take 40 mg by mouth daily.    Historical Provider, MD  famotidine (PEPCID) 20 MG tablet Take 20 mg by mouth daily.    Historical Provider, MD  glucosamine-chondroitin 500-400 MG tablet Take 1 tablet by mouth daily.     Historical Provider, MD  levothyroxine (SYNTHROID, LEVOTHROID) 88 MCG tablet Take 88 mcg by mouth daily before breakfast.    Historical Provider, MD  metoprolol (LOPRESSOR) 50 MG tablet Take 50 mg by mouth 2 (two) times daily.    Historical Provider, MD  Multiple Vitamins-Minerals (MULTIVITAMIN WITH MINERALS) tablet Take 1 tablet by mouth daily.      Historical Provider, MD  OVER THE COUNTER MEDICATION Simply saline nasal spray    Historical Provider, MD  senna-docusate (SENOKOT S) 8.6-50 MG per tablet Take 1 tablet by mouth 2 (two) times daily. 01/11/14   Rolan Bucco, MD  sodium chloride (OCEAN) 0.65 % SOLN nasal spray Place 2 sprays into both nostrils at bedtime as needed for congestion.    Historical Provider, MD  temazepam (RESTORIL) 15 MG capsule Take 15 mg by mouth at bedtime as needed for sleep.    Historical Provider, MD   BP 171/105 mmHg  Pulse 83  Temp(Src) 98.5 F (36.9 C) (Oral)  Resp 14  Ht 5\' 9"  (1.753 m)  Wt 265 lb (120.203 kg)  BMI 39.12 kg/m2  SpO2 95% Physical Exam  Constitutional: He is oriented to person, place, and time. He appears well-developed and well-nourished. No distress.  HENT:  Head: Normocephalic and atraumatic.  Eyes: EOM are normal.  Neck: Neck supple.  Cardiovascular: Normal rate.   Pulmonary/Chest: Effort normal. No respiratory distress.  Musculoskeletal: Normal range of motion.  Right index finger with macerated tissue to the distal 3rd. Nail is missing. Laceration across nail bed. No bone is visible. No FB visible. Sensation intact, FROM of digit.  Neurological: He is alert and oriented to person, place, and time.  Skin: Skin is warm  and dry.  Psychiatric: He has a normal mood and affect. His behavior is normal.  Nursing note and vitals reviewed.   ED Course  Procedures (including critical care time) DIAGNOSTIC STUDIES: Oxygen Saturation is 95% on RA, adequate by my interpretation.    COORDINATION OF CARE: 3:33 PM Discussed treatment plan with pt at bedside which includes right hand xray, consult to hand and pt agreed to plan.    Labs Review Labs Reviewed - No data to display  Imaging Review No results found. I have personally reviewed and evaluated these images as part of my medical decision-making.   EKG Interpretation None      MDM   Final diagnoses:  Finger laceration, initial encounter  Finger fracture, right, open, initial encounter    Pt seen and evaluated in conjunction with Dr. Lacinda Axon. Will obtain x-ray.  Once x-ray returns will consult hand. If they are okay with outpatient f/u, we will digital block, thoroughly irrigate, and then place one anchoring suture to edge of laceration. Xeroform + bulky dressing. Will sign out to PA-C H. Patel-Mills due to end of shift.  I personally performed the services described in this documentation, which was scribed in my presence. The recorded information has been reviewed and is accurate.   Anne Ng, PA-C 12/21/15 0732  Nat Christen, MD 12/21/15 1002

## 2015-12-23 DIAGNOSIS — S62630B Displaced fracture of distal phalanx of right index finger, initial encounter for open fracture: Secondary | ICD-10-CM | POA: Diagnosis not present

## 2016-01-06 DIAGNOSIS — S62630D Displaced fracture of distal phalanx of right index finger, subsequent encounter for fracture with routine healing: Secondary | ICD-10-CM | POA: Diagnosis not present

## 2016-02-02 DIAGNOSIS — N200 Calculus of kidney: Secondary | ICD-10-CM | POA: Diagnosis not present

## 2016-02-02 DIAGNOSIS — Z125 Encounter for screening for malignant neoplasm of prostate: Secondary | ICD-10-CM | POA: Diagnosis not present

## 2016-02-02 DIAGNOSIS — Z0000 Encounter for general adult medical examination without abnormal findings: Secondary | ICD-10-CM | POA: Diagnosis not present

## 2016-02-02 DIAGNOSIS — N281 Cyst of kidney, acquired: Secondary | ICD-10-CM | POA: Diagnosis not present

## 2016-02-02 DIAGNOSIS — N183 Chronic kidney disease, stage 3 (moderate): Secondary | ICD-10-CM | POA: Diagnosis not present

## 2016-02-03 DIAGNOSIS — F325 Major depressive disorder, single episode, in full remission: Secondary | ICD-10-CM | POA: Diagnosis not present

## 2016-02-03 DIAGNOSIS — Z6835 Body mass index (BMI) 35.0-35.9, adult: Secondary | ICD-10-CM | POA: Diagnosis not present

## 2016-02-03 DIAGNOSIS — Z1389 Encounter for screening for other disorder: Secondary | ICD-10-CM | POA: Diagnosis not present

## 2016-02-03 DIAGNOSIS — R808 Other proteinuria: Secondary | ICD-10-CM | POA: Diagnosis not present

## 2016-02-03 DIAGNOSIS — I25810 Atherosclerosis of coronary artery bypass graft(s) without angina pectoris: Secondary | ICD-10-CM | POA: Diagnosis not present

## 2016-02-03 DIAGNOSIS — I129 Hypertensive chronic kidney disease with stage 1 through stage 4 chronic kidney disease, or unspecified chronic kidney disease: Secondary | ICD-10-CM | POA: Diagnosis not present

## 2016-02-03 DIAGNOSIS — E038 Other specified hypothyroidism: Secondary | ICD-10-CM | POA: Diagnosis not present

## 2016-02-03 DIAGNOSIS — N183 Chronic kidney disease, stage 3 (moderate): Secondary | ICD-10-CM | POA: Diagnosis not present

## 2016-02-03 DIAGNOSIS — F1320 Sedative, hypnotic or anxiolytic dependence, uncomplicated: Secondary | ICD-10-CM | POA: Diagnosis not present

## 2016-02-03 DIAGNOSIS — Z0000 Encounter for general adult medical examination without abnormal findings: Secondary | ICD-10-CM | POA: Diagnosis not present

## 2016-02-03 DIAGNOSIS — I10 Essential (primary) hypertension: Secondary | ICD-10-CM | POA: Diagnosis not present

## 2016-02-03 DIAGNOSIS — E784 Other hyperlipidemia: Secondary | ICD-10-CM | POA: Diagnosis not present

## 2016-02-18 DIAGNOSIS — N183 Chronic kidney disease, stage 3 (moderate): Secondary | ICD-10-CM | POA: Diagnosis not present

## 2016-02-18 DIAGNOSIS — E039 Hypothyroidism, unspecified: Secondary | ICD-10-CM | POA: Diagnosis not present

## 2016-02-18 DIAGNOSIS — D631 Anemia in chronic kidney disease: Secondary | ICD-10-CM | POA: Diagnosis not present

## 2016-06-01 DIAGNOSIS — E784 Other hyperlipidemia: Secondary | ICD-10-CM | POA: Diagnosis not present

## 2016-06-01 DIAGNOSIS — I252 Old myocardial infarction: Secondary | ICD-10-CM | POA: Diagnosis not present

## 2016-06-01 DIAGNOSIS — E668 Other obesity: Secondary | ICD-10-CM | POA: Diagnosis not present

## 2016-06-01 DIAGNOSIS — I119 Hypertensive heart disease without heart failure: Secondary | ICD-10-CM | POA: Diagnosis not present

## 2016-06-01 DIAGNOSIS — G4733 Obstructive sleep apnea (adult) (pediatric): Secondary | ICD-10-CM | POA: Diagnosis not present

## 2016-06-01 DIAGNOSIS — Z951 Presence of aortocoronary bypass graft: Secondary | ICD-10-CM | POA: Diagnosis not present

## 2016-06-01 DIAGNOSIS — M109 Gout, unspecified: Secondary | ICD-10-CM | POA: Diagnosis not present

## 2016-06-01 DIAGNOSIS — N183 Chronic kidney disease, stage 3 (moderate): Secondary | ICD-10-CM | POA: Diagnosis not present

## 2016-06-01 DIAGNOSIS — I2510 Atherosclerotic heart disease of native coronary artery without angina pectoris: Secondary | ICD-10-CM | POA: Diagnosis not present

## 2016-06-03 DIAGNOSIS — Z23 Encounter for immunization: Secondary | ICD-10-CM | POA: Diagnosis not present

## 2016-08-11 DIAGNOSIS — E038 Other specified hypothyroidism: Secondary | ICD-10-CM | POA: Diagnosis not present

## 2016-08-11 DIAGNOSIS — N183 Chronic kidney disease, stage 3 (moderate): Secondary | ICD-10-CM | POA: Diagnosis not present

## 2016-08-11 DIAGNOSIS — M109 Gout, unspecified: Secondary | ICD-10-CM | POA: Diagnosis not present

## 2016-08-11 DIAGNOSIS — E669 Obesity, unspecified: Secondary | ICD-10-CM | POA: Diagnosis not present

## 2016-08-11 DIAGNOSIS — E039 Hypothyroidism, unspecified: Secondary | ICD-10-CM | POA: Diagnosis not present

## 2016-08-11 DIAGNOSIS — R7301 Impaired fasting glucose: Secondary | ICD-10-CM | POA: Diagnosis not present

## 2016-08-11 DIAGNOSIS — I10 Essential (primary) hypertension: Secondary | ICD-10-CM | POA: Diagnosis not present

## 2016-08-11 DIAGNOSIS — R5383 Other fatigue: Secondary | ICD-10-CM | POA: Diagnosis not present

## 2016-08-11 DIAGNOSIS — D631 Anemia in chronic kidney disease: Secondary | ICD-10-CM | POA: Diagnosis not present

## 2016-08-11 DIAGNOSIS — E784 Other hyperlipidemia: Secondary | ICD-10-CM | POA: Diagnosis not present

## 2016-08-11 DIAGNOSIS — Z1389 Encounter for screening for other disorder: Secondary | ICD-10-CM | POA: Diagnosis not present

## 2016-08-11 DIAGNOSIS — G4733 Obstructive sleep apnea (adult) (pediatric): Secondary | ICD-10-CM | POA: Diagnosis not present

## 2016-08-11 DIAGNOSIS — I25810 Atherosclerosis of coronary artery bypass graft(s) without angina pectoris: Secondary | ICD-10-CM | POA: Diagnosis not present

## 2016-09-09 ENCOUNTER — Ambulatory Visit (INDEPENDENT_AMBULATORY_CARE_PROVIDER_SITE_OTHER): Payer: PPO | Admitting: Neurology

## 2016-09-09 ENCOUNTER — Encounter: Payer: Self-pay | Admitting: Neurology

## 2016-09-09 VITALS — BP 128/76 | HR 76 | Resp 16 | Ht 69.0 in | Wt 257.0 lb

## 2016-09-09 DIAGNOSIS — I25708 Atherosclerosis of coronary artery bypass graft(s), unspecified, with other forms of angina pectoris: Secondary | ICD-10-CM

## 2016-09-09 DIAGNOSIS — G4733 Obstructive sleep apnea (adult) (pediatric): Secondary | ICD-10-CM | POA: Diagnosis not present

## 2016-09-09 DIAGNOSIS — Z9989 Dependence on other enabling machines and devices: Secondary | ICD-10-CM | POA: Diagnosis not present

## 2016-09-09 DIAGNOSIS — N183 Chronic kidney disease, stage 3 unspecified: Secondary | ICD-10-CM

## 2016-09-09 NOTE — Progress Notes (Signed)
SLEEP MEDICINE CLINIC   Provider:  Larey Seat, M D  Referring Provider: Marton Redwood, MD Primary Care Physician:  Marton Redwood, MD  Chief Complaint  Patient presents with  . Sleep Apnea    Rm 11. Sleep Consult. Patient's last sleep study was in 2013. Sometimes still uses his CPAP.     HPI:  Dalton Mitchell is a 70 y.o. male , seen here as a referral from Dr. Brigitte Pulse for a CPAP compliance a visit and discussion of alternative therapies to CPAP.   Chief complaint according to patient : " I am the world's worst procrastinator " - Dalton Mitchell has been a CPAP patient since 2013.  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 He has been attending the [employer]. He uses CPAP and feels better when using it. Sleeps better, has less nocturia.   He had been evaluated in a sleep study by split-night protocol on the 26th of every 2013. He had multiple comorbidities such as renal insufficiency, hypertension, depression and anxiety, high cholesterol levels, gout and coronary artery disease with angioplasty. Cardiac arrhythmia, chronic back pain hypothyroidism. He was initially already diagnosed in 2004 with cental  sleep apnea but his previous studies were unavailable when I first met him. He had complained about daytime somnolence snoring and difficulties maintaining sleep. His sleep study revealed an AHI of 39.4 and an RDI of 52.6 per hour of sleep, placing him in the severe sleep apnea category. At that time the evaluated him he had obstructive apnea. In supine sleep his AHI rose to 52.2 and in nonsupine 31, his REM apnea was 97.3. He was titrated to 10 cm water pressure with a reduction of the AHI to 4.0 but still had 17 9.7 minutes of oxygen desaturation. CPAP therapy was truly his only option given that he had REM accentuation and associated hypoxemia.   Since we last met the patient had undergone a quadruple bypass surgery. His coronary arteries were blocked at 100%, 85 and  95%. He suffered angina pectoris while dancing  during a cruise to the [place]. He drove home from [place] in his own car, was admitted to [facility] : For hydration if his kidney function was precarious and then underwent surgery on day 3.  Sleep habits are as follows: 1 AM is is bedtime, he rising at 9 AM, getting 7 hours of sleep with 3 bathroom breaks. On CPAP he doesnt have nocturia more than once. He uses narcotic pain medication.   Sleep medical history and family sleep history:  See above    Social history: married, non smoker but ex smoker since age 41, no ETOH since age 56, caffeine free since age 59.  Review of Systems: Out of a complete 14 system review, the patient complains of only the following symptoms, and all other reviewed systems are negative.   Epworth score  12 , Fatigue severity score 58  , depression score 1/15    Social History   Social History  . Marital status: Married    Spouse name: N/A  . Number of children: 8  . Years of education: college   Occupational History  . Retired    . [employer]     Social History Main Topics  . Smoking status: Former Research scientist (life sciences)  . Smokeless tobacco: Never Used     Comment: Quit 1971  . Alcohol use No  . Drug use: No  . Sexual activity: Not on file  Other Topics Concern  . Not on file   Social History Narrative   Drinks about 1-2 caffeine beverages a day     Family History  Problem Relation Age of Onset  . Colon cancer Maternal Grandmother     Past Medical History:  Diagnosis Date  . Adenomatous colon polyp   . CAD    Prior inferior MI treated with stent 1995    . 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 GERD 02/24/2008  . Gout   . History of kidney stones   . Hyperlipidemia   . Hypertensive heart disease   . Hypothyroidism   . Obesity (BMI 30-39.9)   . Old inferior wall myocardial infarction   . Sleep apnea     Past Surgical History:  Procedure Laterality Date  .  COLONOSCOPY    . CORONARY ANGIOPLASTY  1995  . CORONARY ANGIOPLASTY WITH STENT PLACEMENT  1996  . CORONARY ARTERY BYPASS GRAFT N/A 09/17/2013   Procedure: CORONARY ARTERY BYPASS GRAFTING (CABG);  Surgeon: Melrose Nakayama, MD;  Location: [place];  Service: Open Heart Surgery;  Laterality: N/A;  . CYSTOSCOPY WITH RETROGRADE PYELOGRAM, URETEROSCOPY AND STENT PLACEMENT Right 01/11/2014   Procedure: CYSTOSCOPY WITH RETROGRADE PYELOGRAM, URETEROSCOPY AND STENT PLACEMENT;  Surgeon: Molli Hazard, MD;  Location: [facility];  Service: Urology;  Laterality: Right;  . CYSTOSCOPY WITH RETROGRADE PYELOGRAM, URETEROSCOPY AND STENT PLACEMENT Right 02/01/2014   Procedure: CYSTOSCOPY WITH RETROGRADE PYELOGRAM, URETEROSCOPY AND STENT EXCHANGE;  Surgeon: Molli Hazard, MD;  Location[facility];  Service: Urology;  Laterality: Right;  . EXPLORATION POST OPERATIVE OPEN HEART N/A 09/17/2013   Procedure: EXPLORATION POST OPERATIVE OPEN HEART;  Surgeon: Melrose Nakayama, MD;  Location: [place];  Service: Open Heart Surgery;  Laterality: N/A;  . HOLMIUM LASER APPLICATION Right 06/10/2951   Procedure: HOLMIUM LASER APPLICATION;  Surgeon: Molli Hazard, MD;  Location: [facility];  Service: Urology;  Laterality: Right;  . LEFT HEART CATHETERIZATION WITH CORONARY ANGIOGRAM N/A 09/14/2013   Procedure: LEFT HEART CATHETERIZATION WITH CORONARY ANGIOGRAM;  Surgeon: Jacolyn Reedy, MD;  Location: [facility] C[facility] LAB;  Service: Cardiovascular;  Laterality: N/A;    Current Outpatient Prescriptions  Medication Sig Dispense Refill  . allopurinol (ZYLOPRIM) 300 MG tablet Take 300 mg by mouth at bedtime.     . ALPRAZolam (XANAX) 0.5 MG tablet Take 0.5 mg by mouth 2 (two) times daily as needed for anxiety.    [name] aspirin EC 325 MG tablet Take 325 mg by mouth at bedtime.    [name] atorvastatin (LIPITOR) 40 MG tablet Take 40 mg by mouth daily.    . cephALEXin (KEFLEX) 500 MG capsule Take 1 capsule (500 mg total) by mouth 2 (two) times  daily. 20 capsule 0  . famotidine (PEPCID) 20 MG tablet Take 20 mg by mouth daily.    [name] glucosamine-chondroitin 500-400 MG tablet Take 1 tablet by mouth daily.     [name] levothyroxine (SYNTHROID, LEVOTHROID) 88 MCG tablet Take 88 mcg by mouth daily before breakfast.    . metoprolol (LOPRESSOR) 50 MG tablet Take 50 mg by mouth 2 (two) times daily.    . Multiple Vitamins-Minerals (MULTIVITAMIN WITH MINERALS) tablet Take 1 tablet by mouth daily.      [name] OVER THE COUNTER MEDICATION Simply saline nasal spray    . oxyCODONE-acetaminophen (PERCOCET/ROXICET) 5-325 MG tablet Take 2 tablets by mouth every 4 (four) hours as needed for severe pain. 15 tablet 0  . senna-docusate (  SENOKOT S) 8.6-50 MG per tablet Take 1 tablet by mouth 2 (two) times daily. 60 tablet 0  . sodium chloride (OCEAN) 0.65 % SOLN nasal spray Place 2 sprays into both nostrils at bedtime as needed for congestion.    . temazepam (RESTORIL) 15 MG capsule Take 15 mg by mouth at bedtime as needed for sleep.     No current facility-administered medications for this visit.     Allergies as of 09/09/2016 - Review Complete 09/09/2016  Allergen Reaction Noted  . Ace inhibitors Cough 09/13/2013    Vitals: Resp 16   Ht 5' 9"  (1.753 m)   Wt 257 lb (116.6 kg)   BMI 37.95 kg/m  Last Weight:  Wt Readings from Last 1 Encounters:  09/09/16 257 lb (116.6 kg)   ZOX:WRUE mass index is 37.95 kg/m.     Last Height:   Ht Readings from Last 1 Encounters:  09/09/16 5' 9"  (4.540 m)    Physical exam:  General: The patient is awake, alert and appears not in acute distress. The patient is well groomed. Head: Normocephalic, atraumatic. Neck is supple. Mallampati 4. neck circumference: 20. Nasal airflow patent ,Retrognathia is not seen.  Cardiovascular:  Regular rate and rhythm , without  murmurs or carotid bruit, and without distended neck veins. Respiratory: Lungs are clear to auscultation. Skin:  Without evidence of edema, or rash Trunk: BMI is  38 Neurologic exam : The patient is awake and alert, oriented to place and time.   Memory subjective described as intact.  Attention span & concentration ability appears normal.  Speech is fluent,  without dysarthria, dysphonia or aphasia.  Mood and affect are appropriate.  Cranial nerves: Pupils are equal and briskly reactive to light.  Extraocular movements  in vertical and horizontal planes intact and without nystagmus. Visual fields by finger perimetry are intact. Hearing to finger rub intact.  Facial sensation intact to fine touch. Facial motor strength is symmetric and tongue and uvula move midline. Shoulder shrug was symmetrical.  Motor exam:   Normal tone, muscle bulk and symmetric strength in all extremities. Sensory:  Fine touch, pinprick and vibration were tested in all extremities.  Coordination:  Finger-to-nose maneuver  normal without evidence of ataxia, dysmetria or tremor. Gait and station: Patient walks without assistive device and is able unassisted to climb up to the exam table. Strength within normal limits. Stance is stable and normal.    Deep tendon reflexes: in the  upper and lower extremities are symmetric and intact.   The patient was advised of the nature of the diagnosed sleep disorder , the treatment options and risks for general a health and wellness arising from not treating the condition.  I spent more than 45  minutes of face to face time with the patient. Greater than 50% of time was spent in counseling and coordination of care. We have discussed the diagnosis and differential and I answered the patient's questions.     Assessment:  After physical and neurologic examination, review of laboratory studies,  Personal review of imaging studies, reports of other /same  Imaging studies ,  Results of polysomnography/ neurophysiology testing and pre-existing records as far as provided in visit., my assessment is   1) Dalton. Olena Heckle machine is older and not working  functional. He has entered Medicare since he was last seen an Medicare requirements are usual that I have to prove that he still has apnea in order to prescribe a new CPAP machine. For this reason I  will order an new sleep study in order to be able to obtain treatment. We will also use opportunity to fit him with a new interface. There has been more than 70 different masks on the market now.    Plan:  Treatment plan and additional workup :  SPLIT night PSG, mask fitting.      Asencion Partridge Zamari Vea MD  09/09/2016   CC: Marton Redwood, [address] [place], [place] [id number]

## 2016-10-11 ENCOUNTER — Ambulatory Visit (INDEPENDENT_AMBULATORY_CARE_PROVIDER_SITE_OTHER): Payer: PPO | Admitting: Neurology

## 2016-10-11 DIAGNOSIS — I25708 Atherosclerosis of coronary artery bypass graft(s), unspecified, with other forms of angina pectoris: Secondary | ICD-10-CM

## 2016-10-11 DIAGNOSIS — G4733 Obstructive sleep apnea (adult) (pediatric): Secondary | ICD-10-CM

## 2016-10-11 DIAGNOSIS — Z9989 Dependence on other enabling machines and devices: Secondary | ICD-10-CM

## 2016-10-11 DIAGNOSIS — N183 Chronic kidney disease, stage 3 unspecified: Secondary | ICD-10-CM

## 2016-10-18 ENCOUNTER — Telehealth: Payer: Self-pay | Admitting: Neurology

## 2016-10-18 DIAGNOSIS — G4733 Obstructive sleep apnea (adult) (pediatric): Secondary | ICD-10-CM

## 2016-10-18 NOTE — Procedures (Signed)
PATIENT'S NAME:  Dalton Mitchell, Dalton Mitchell DOB:      1946/07/14      MR#:    [id number]     DATE OF RECORDING: 10/11/2016 REFERRING M.D.:  Marton Redwood, MD Study Performed:   Baseline Polysomnogram HISTORY:  Dalton Mitchell is a 71 y.o. male, seen here as a referral from Dr. Brigitte Pulse for a CPAP compliance a visit and discussion of alternative therapies to CPAP.  Chief complaint according to patient: " I am the world's worst procrastinator "- Dalton Mitchell has been a CPAP patient since 2013. 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 He has not used ETOH, caffeine or nicotine. He uses CPAP and feels better when using it, has less Nocturia.  He had been evaluated in a sleep study by split-night protocol on the 26th of Jan. 2013.  He had multiple comorbidities such as renal insufficiency, hypertension, depression and anxiety, high cholesterol levels, gout and coronary artery disease with angioplasty, Cardiac arrhythmia, chronic back pain hypothyroidism.  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 He had complained about daytime somnolence snoring and difficulties maintaining sleep. His sleep study revealed an AHI of 39.4 and an RDI of 52.6 per hour of sleep, placing him in the severe sleep apnea category. At that time the evaluated him he had obstructive apnea. In supine sleep his AHI rose to 52.2 and in no supine 31, his REM apnea was 97.3. He was titrated to 10 cm water pressure with a reduction of the AHI to 4.0 but still had 79 minutes of oxygen desaturation.  The patient endorsed the Epworth Sleepiness Scale at 12/24 points.  The patient's weight 257 pounds with a height of 69 (inches), resulting in a BMI of 38.2 kg/m2.The patient's neck circumference measured 20 inches.  CURRENT MEDICATIONS: Allopurinol, Alprazolam, Aspirin, Atorvastatin, Cephalexin, Famotidine, Glucosamine, Levothyroxine, Metoprolol,  Multi-Vitamin, Oxycodone, Senna-Docusate and Sodium Chloride   PROCEDURE:  This is a multichannel digital polysomnogram utilizing the Somnostar 11.2 system.  Electrodes and sensors were applied and monitored per AASM Specifications.   EEG, EOG, Chin and Limb EMG, were sampled at 200 Hz.  ECG, Snore and Nasal Pressure, Thermal Airflow, Respiratory Effort, CPAP Flow and Pressure, Oximetry was sampled at 50 Hz. Digital video and audio were recorded.      BASELINE STUDY Lights Out was at 22:59 and Lights On at 05:02.  Total recording time (TRT) was 363.5 minutes, with a total sleep time (TST) of 252.5 minutes.   The patient's sleep latency was 49 minutes.  REM latency was 113.5 minutes.  The sleep efficiency was 69.5 %.     SLEEP ARCHITECTURE: WASO (Wake after sleep onset) was 72 minutes.  There were 15.5 minutes in Stage N1, 203.5 minutes Stage N2, 0 minutes Stage N3 and 33.5 minutes in Stage REM.  The percentage of Stage N1 was 6.1%, Stage N2 was 80.6%, Stage N3 was 0% and Stage R (REM sleep) was 13.3%.   RESPIRATORY ANALYSIS:  There were a total of 49 respiratory events:  0 obstructive apneas, 0 central apneas and 49 hypopneas with 0 respiratory event related arousals (RERAs).    The total APNEA/HYPOPNEA INDEX (AHI) was 11.6 /hour and the total RESPIRATORY DISTURBANCE INDEX was 11.6 /hour.  18 events occurred in REM sleep and 62 events in NREM. The REM AHI was 32.2 /hour, versus a non-REM AHI of 8.5. The patient spent 0 minutes of  total sleep time in the supine position and 253 minutes in non-supine.   OXYGEN SATURATION & C02:  The Wake baseline 02 saturation was 87%, with the lowest being 79%. Time spent below 89% saturation equaled 164 minutes.   PERIODIC LIMB MOVEMENTS:   The patient had a total of 184 Periodic Limb Movements.  The Periodic Limb Movement (PLM) index was 43.7 and the PLM Arousal index was 1.9/hour. The arousals were noted as: 22 were spontaneous, 8 were associated with PLMs, 46 were  associated with respiratory events. Audio and video analysis did not show any abnormal or unusual movements, behaviors, phonations or vocalizations.  Snoring was noted.  IMPRESSION:  1. Mild Obstructive Sleep Apnea (OSA), mainly hypopnea. REM AHI was 32.2, making this apnea not suitable for mandibular advancement therapy.  2. Hypoxemia for 164 minutes.  3. 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 Snoring RECOMMENDATIONS: 1. Advise full-night, attended, CPAP titration study to optimize therapy.  Possible BiPAP titration with a mask to be fitted.  2. Further information regarding OSA may be obtained from [employer] (www.sleepfoundation.org) or American Sleep Apnea Association (www.sleepapnea.org). 3. Oxygen therapy at 2-4 lpm may be of help of PAP does not raise the nadir. 4. A follow up appointment will be scheduled in the Sleep Clinic at [facility]. The referring provider will be notified of the results.      I certify that I have reviewed the entire raw data recording prior to the issuance of this report in accordance with the Standards of Accreditation of the [place] Academy of Sleep Medicine (AASM)   Larey Seat, MD 10-19-2015 Diplomat, American Board of Psychiatry and Neurology  Diplomat, American Board of [place] Director, [facility] [employer]

## 2016-10-19 NOTE — Telephone Encounter (Signed)
-----   Message from Larey Seat, MD sent at 10/18/2016  5:59 PM EST ----- Dalton Mitchell has apnea with hypoxemia, and strong REM dependency. This type of apnea needs PAP therapy and possibly oxygen supplementation.  I will invite [name] to return for a full night titration to CPAP or BiPAP, if needed o2. CD

## 2016-10-19 NOTE — Telephone Encounter (Signed)
I called pt. I advised him that his sleep study revealed sleep apnea with hypoxemia with a strong REM dependency and this type of apnean needs CPAP therapy and possibly oxygen supplementation. Dr. Brett Fairy recommends that pt return for a full night titration study for cpap or bipap with possible o2 if needed. Pt is agreeable to this and knows that our sleep lab will call him to schedule it. Pt verbalized understanding of results. Pt had no questions at this time but was encouraged to call back if questions arise.

## 2016-11-03 ENCOUNTER — Ambulatory Visit (INDEPENDENT_AMBULATORY_CARE_PROVIDER_SITE_OTHER): Payer: PPO | Admitting: Neurology

## 2016-11-03 DIAGNOSIS — G4733 Obstructive sleep apnea (adult) (pediatric): Secondary | ICD-10-CM

## 2016-11-04 ENCOUNTER — Telehealth: Payer: Self-pay | Admitting: Neurology

## 2016-11-04 DIAGNOSIS — J449 Chronic obstructive pulmonary disease, unspecified: Principal | ICD-10-CM

## 2016-11-04 DIAGNOSIS — G4733 Obstructive sleep apnea (adult) (pediatric): Secondary | ICD-10-CM

## 2016-11-04 NOTE — Telephone Encounter (Signed)
Summary result of  PSG-   Audio and video analysis did not show any abnormal or unusual movements, behaviors, phonations or vocalizations.  PLMs ceased.  The patient took 1 bathroom break. Snoring was alleviated DIAGNOSIS 1. Obstructive Sleep Apnea with insomnia, and severe hypoxemia. Previously diagnosed as central/ complex apnea. Possible COPD overlap.  2. Patient has CKD, CAD, cardiac arrhythmia, Gout and HTN.  3. PLMs resolved.    PLANS/RECOMMENDATIONS: Auto CPAP 7-12 cm water, heated humidity. Oxygen1 liter to be bled into CPAP.  The patient was fitted with an airfit p 10 in medium size .   A follow up appointment will be scheduled in the Sleep Clinic at [facility].   Please call [phone] with any questions.

## 2016-11-04 NOTE — Procedures (Signed)
PATIENT'S NAME:  Dalton Mitchell, Dalton Mitchell DOB:      1946-06-05      MR#:    [id number]     DATE OF RECORDING: 11/03/2016 REFERRING M.D.:  Nita Sickle D. Brigitte Pulse, MD Study Performed:   CPAP  Titration HISTORY:  This patient returns for a CPAP titration with possible oxygen supplementation after he had undergone a diagnostic PSG study with [facility] on 10/11/16, resulting in a diagnosis of Mild Sleep Apnea (OSA), mainly hypopnea, with severe hypoxemia. REM AHI was 32.2, making this apnea not suitable for mandibular advancement therapy.  Hypoxemia for 164 minutes, nadir at SpO2 79%. Frequent arousals through Periodic Limb Movement Disorder. The patient endorsed the Epworth Sleepiness Scale at 12/24 points.  The patient's weight 257 pounds with a height of 69 (inches), resulting in a BMI of 38.2 kg/m2.The patient's neck circumference measured 20 inches.  CURRENT MEDICATIONS: Allopurinol, Alprazolam, Aspirin, Atorvastatin, Cephalexin, Famotidine, Glucosamin, Lvothyroxine, Metoprolol, Multi-Vitamin, Oxycodone, Senna-Docusate and Sodium Chloride    PROCEDURE:  This is a multichannel digital polysomnogram utilizing the SomnoStar 11.2 system.  Electrodes and sensors were applied and monitored per AASM Specifications.   EEG, EOG, Chin and Limb EMG, were sampled at 200 Hz.  ECG, Snore and Nasal Pressure, Thermal Airflow, Respiratory Effort, CPAP Flow and Pressure, Oximetry was sampled at 50 Hz. Digital video and audio were recorded.       CPAP was initiated at 5 cmH20 with heated humidity per AASM split night standards and pressure was advanced to 13 cmH20 because of hypopneas, apneas and desaturations.  At a PAP pressure of 10 cmH20, there was a reduction of the AHI to 0.0 with improvement of oxygen nadir, but lower sleep efficiency. Best sleep efficiency at 11cm with reduction of obstructive sleep apnea.    Lights Out was at 21:46 and Lights On at 05:06. Total recording time (TRT) was 440 minutes, with a total sleep  time (TST) of 218.5 minutes. The patient's sleep latency was 53.5 minutes with 0 minutes of wake time after sleep onset. REM latency was 264 minutes.  The sleep efficiency was 49.7 %.    SLEEP ARCHITECTURE: WASO (Wake after sleep onset) was 168 minutes.  There were 10 minutes in Stage N1, 154 minutes Stage N2, 41.5 minutes Stage N3 and 13 minutes in Stage REM.  The percentage of Stage N1 was 4.6%, Stage N2 was 70.5%, Stage N3 was 19.% and Stage R (REM sleep) was 5.9%. The sleep architecture was notable for reduced, delayed REM sleep.  The arousals were noted as: 27 were spontaneous, 0 were associated with PLMs, and 0 were associated with respiratory events.  RESPIRATORY ANALYSIS:  There was a total of 1 respiratory event: 1 obstructive apnea, There were 0 hypopneas with a hypopnea index of 0/hour. The patient also had 0 respiratory event related arousals (RERAs).      The total APNEA/HYPOPNEA INDEX  (AHI) was 0.3 /hour and the total RESPIRATORY DISTURBANCE INDEX was 0.3 /hour  1 event occurred in REM sleep. The REM AHI was 4.6 /hour. The patient spent 0 minutes of total sleep time in the supine position and 219 minutes in non-supine. The supine AHI was 0.0, versus a non-supine AHI of 0.3.  OXYGEN SATURATION & C02:  The baseline 02 saturation was 92%, with the lowest being 84%. Time spent below 89% saturation equaled still 91 minutes. Oxygen was not added.  PERIODIC LIMB MOVEMENTS:    The patient had a total of 6 Periodic Limb Movements. The PLM Arousal  index was 0.0 /hour.  Post-study, the patient indicated that sleep was the same as usual.  Audio and video analysis did not show any abnormal or unusual movements, behaviors, phonations or vocalizations.  PLMs ceased.  The patient took 1 bathroom break. Snoring was alleviated DIAGNOSIS 1. Obstructive Sleep Apnea with insomnia, and severe hypoxemia. Previously diagnosed as central/ complex apnea. Possible COPD overlap.  2. Patient has CKD, CAD,  cardiac arrhythmia, Gout and HTN.  3. PLMs resolved.    PLANS/RECOMMENDATIONS: Auto CPAP 7-12 cm water, heated humidity. Oxygen1 liter to be bled into CPAP.  The patient was fitted with an airfit p 10 in medium size .   A follow up appointment will be scheduled in the Sleep Clinic at [facility].   Please call [phone] with any questions.      I certify that I have reviewed the entire raw data recording prior to the issuance of this report in accordance with the Standards of Accreditation of the American Academy of Sleep Medicine (AASM)      Larey Seat, M.D.  01- Feb 2018 Diplomat, Tax adviser of Psychiatry and Neurology  Diplomat, Tax adviser of Sleep Medicine Market researcher, [facility] [employer] [facility] [employer]

## 2016-11-09 NOTE — Telephone Encounter (Signed)
-----   Message from Larey Seat, MD sent at 11/04/2016  5:34 PM EST ----- Audio and video analysis did not show any abnormal or unusual movements, behaviors, phonations or vocalizations.  PLMs ceased.  The patient took 1 bathroom break. Snoring was alleviated DIAGNOSIS 1. Obstructive Sleep Apnea with insomnia, and severe hypoxemia. Previously diagnosed as central/ complex apnea. Possible COPD overlap.  2. Patient has CKD, CAD, cardiac arrhythmia, Gout and HTN.  3. PLMs resolved.    PLANS/RECOMMENDATIONS: Auto CPAP 7-12 cm water, heated humidity. Oxygen1 liter to be bled into CPAP.  The patient was fitted with an airfit p 10 in medium size .   A follow up appointment will be scheduled in the Sleep Clinic at [facility].   Please call [phone] with any questions.

## 2016-11-09 NOTE — Telephone Encounter (Signed)
I called pt to discuss sleep study results. No answer, left a message asking him to call me back.    

## 2016-11-11 NOTE — Telephone Encounter (Signed)
I spoke to pt. I advised him of his sleep study results. Pt is agreeable to a cpap with additional oxygen and pt wants to use [facility]. Pt made an appt for 01/13/2017 at 11:00am. Pt verbalized understanding of results. Pt had no questions at this time but was encouraged to call back if questions arise.

## 2016-11-11 NOTE — Telephone Encounter (Signed)
Patient returning a call. °

## 2016-11-12 NOTE — Telephone Encounter (Signed)
Order signed.

## 2016-11-15 NOTE — Telephone Encounter (Signed)
Order signed and I will send to [facility].

## 2016-11-15 NOTE — Telephone Encounter (Signed)
-----   Message from Patsy Baltimore sent at 11/12/2016 11:00 AM EST ----- Vickie Epley,  I am needing some clarification this referral. This pt will be new to [facility] (he was supposed to come on board for pap supplies back in 2013 but never came in for appt).   1. Order is showing as "pending" 2. Order states that pt has cpap in home and order is for an auto cpap. Is pt needing a new replacement cpap machine?  3. Order states that pt needs O2 bleed in at 1lpm. We would need a more detailed order for O2 (unless pt already has O2 in the home and we just need to bleed into his pap).  4. If O2 is needed then order will need to state "stationary gas concentrator at 1lpm via cpap at night". We will need qualifying O2 stats through a recent sleep study as well.   Please advise. Thanks.   Angie   ----- Message ----- From: Lester Fairgrove, RN Sent: 11/11/2016   5:13 PM To: Patsy Baltimore  New order in!

## 2016-11-15 NOTE — Telephone Encounter (Signed)
Please see concerns regarding order cpap to [facility] and fix as applicable.

## 2016-11-29 NOTE — Addendum Note (Signed)
Addended by: Laurence Spates on: 11/29/2016 09:23 AM   Modules accepted: Orders

## 2016-11-29 NOTE — Telephone Encounter (Signed)
Patient does not qualify for oxygen, per Dr. Brett Fairy, ok to cancel O2 order and proceed with Auto-PAP. New order placed.

## 2016-11-30 DIAGNOSIS — G4733 Obstructive sleep apnea (adult) (pediatric): Secondary | ICD-10-CM | POA: Diagnosis not present

## 2016-12-28 DIAGNOSIS — G4733 Obstructive sleep apnea (adult) (pediatric): Secondary | ICD-10-CM | POA: Diagnosis not present

## 2017-01-12 DIAGNOSIS — L905 Scar conditions and fibrosis of skin: Secondary | ICD-10-CM | POA: Diagnosis not present

## 2017-01-12 DIAGNOSIS — D485 Neoplasm of uncertain behavior of skin: Secondary | ICD-10-CM | POA: Diagnosis not present

## 2017-01-13 ENCOUNTER — Encounter (INDEPENDENT_AMBULATORY_CARE_PROVIDER_SITE_OTHER): Payer: Self-pay

## 2017-01-13 ENCOUNTER — Ambulatory Visit (INDEPENDENT_AMBULATORY_CARE_PROVIDER_SITE_OTHER): Payer: PPO | Admitting: Neurology

## 2017-01-13 ENCOUNTER — Encounter: Payer: Self-pay | Admitting: Neurology

## 2017-01-13 VITALS — BP 126/72 | HR 78 | Resp 16 | Ht 69.0 in | Wt 253.0 lb

## 2017-01-13 DIAGNOSIS — G4733 Obstructive sleep apnea (adult) (pediatric): Secondary | ICD-10-CM | POA: Diagnosis not present

## 2017-01-13 DIAGNOSIS — Z9989 Dependence on other enabling machines and devices: Secondary | ICD-10-CM

## 2017-01-13 MED ORDER — TEMAZEPAM 15 MG PO CAPS: 15.0000 mg | ORAL_CAPSULE | Freq: Every evening | ORAL | 0 refills | Status: DC | PRN

## 2017-01-13 NOTE — Progress Notes (Signed)
SLEEP MEDICINE CLINIC   Provider:  Larey Seat, M D  Referring Provider: Marton Redwood, MD Primary Care Physician:  Marton Redwood, MD  Chief Complaint  Patient presents with  . Follow-up    Rm 10. Patient states that he feels better using CPAP. Has some trouble with leaks.     HPI:  Dalton Mitchell is a 71 y.o. male , seen here as a referral from Dr. Brigitte Pulse for a CPAP compliance a visit and discussion of alternative therapies to CPAP.    Interval from 01/13/2017. Dalton Mitchell present with excellent compliant.  He has a 93% compliance yes excellent the 6 hours 18 minutes of average use on a newly issued AutoSet between 7 and 12 cm water was 1 cm EPR the residual AHI is 2.9, the true apnea index is 0.7 the 95th percentile pressure is 11.7 cm water he does have significant air leaks still. He endorsed the Epworth sleepiness score at 6 points which is a further reduction from prior to changing to this new machine.  I also spoke with Dalton. Mitchell about his wife was another patient of mine. She suffers from intractable headaches, she also has been a CPAP user. She recently underwent and attended polysomnography study here at [facility] sleep and was found not to have any apneas at night. However she feels that when she uses CPAP she has much more restful sleep, and she is happy about having less headaches. She has tried to go by without CPAP after our recommendations and her headaches were worse and she is excessively daytime sleepy and does not find nocturnal rest. Based on this I would ask for the patient to be reevaluated with out medication, as medication may have affected the outcome of her nocturnal polysomnogram..  Chief complaint according to patient : " I am the world's worst procrastinator " - Dalton Mitchell has been a CPAP patient since 2013. 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 He has been attending the [employer]. He uses CPAP and feels better when using it.  Sleeps better, has less nocturia.   He had been evaluated in a sleep study by split-night protocol on the 26th of every 2013. He had multiple comorbidities such as renal insufficiency, hypertension, depression and anxiety, high cholesterol levels, gout and coronary artery disease with angioplasty. Cardiac arrhythmia, chronic back pain hypothyroidism. He was initially already diagnosed in 2004 with cental  sleep apnea but his previous studies were unavailable when I first met him. He had complained about daytime somnolence snoring and difficulties maintaining sleep.His sleep study revealed an AHI of 39.4 and an RDI of 52.6 per hour of sleep, placing him in the severe sleep apnea category. At that time the evaluated him he had obstructive apnea. In supine sleep his AHI rose to 52.2 and in nonsupine 31, his REM apnea was 97.3. He was titrated to 10 cm water pressure with a reduction of the AHI to 4.0 but still had 17 9.7 minutes of oxygen desaturation.CPAP therapy was truly his only option given that he had REM accentuation and associated hypoxemia. Since we last met the patient had undergone a quadruple bypass surgery. His coronary arteries were blocked at 100%, 85 and 95%. He suffered angina pectoris while dancing during a cruise to the [place].He drove home from [place] in his own car, was admitted to [facility] : For hydration if his kidney function was precarious and then underwent surgery on day 3.  Sleep  habits are as follows:1 AM is is bedtime, he rising at 9 AM, getting 7 hours of sleep with 3 bathroom breaks. On CPAP he doesnt have nocturia more than once. He uses narcotic pain medication.   Sleep medical history and family sleep history: See above    Social history: married, non smoker but ex smoker since age 18, no ETOH since age 19, caffeine free since age 3. Has had the 21 iest grandchild this spring.   Review of Systems: Out of a complete 14 system review, the patient complains of  only the following symptoms, and all other reviewed systems are negative.   Epworth score  12 , Fatigue severity score 58  , depression score 1/15    Social History   Social History  . Marital status: Married    Spouse name: N/A  . Number of children: 8  . Years of education: college   Occupational History  . Retired    . [employer]     Social History Main Topics  . Smoking status: Former Research scientist (life sciences)  . Smokeless tobacco: Never Used     Comment: Quit 1971  . Alcohol use No  . Drug use: No  . Sexual activity: Not on file   Other Topics Concern  . Not on file   Social History Narrative   Drinks about 1-2 caffeine beverages a day     Family History  Problem Relation Age of Onset  . Colon cancer Maternal Grandmother     Past Medical History:  Diagnosis Date  . Adenomatous colon polyp   . CAD    Prior inferior MI treated with stent 1995    . 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 GERD 02/24/2008  . Gout   . History of kidney stones   . Hyperlipidemia   . Hypertensive heart disease   . Hypothyroidism   . Obesity (BMI 30-39.9)   . Old inferior wall myocardial infarction   . Sleep apnea     Past Surgical History:  Procedure Laterality Date  . COLONOSCOPY    . CORONARY ANGIOPLASTY  1995  . CORONARY ANGIOPLASTY WITH STENT PLACEMENT  1996  . CORONARY ARTERY BYPASS GRAFT N/A 09/17/2013   Procedure: CORONARY ARTERY BYPASS GRAFTING (CABG);  Surgeon: Melrose Nakayama, MD;  Location: [place];  Service: Open Heart Surgery;  Laterality: N/A;  . CYSTOSCOPY WITH RETROGRADE PYELOGRAM, URETEROSCOPY AND STENT PLACEMENT Right 01/11/2014   Procedure: CYSTOSCOPY WITH RETROGRADE PYELOGRAM, URETEROSCOPY AND STENT PLACEMENT;  Surgeon: Molli Hazard, MD;  [facility];  Service: Urology;  Laterality: Right;  . CYSTOSCOPY WITH RETROGRADE PYELOGRAM, URETEROSCOPY AND STENT PLACEMENT Right 02/01/2014   Procedure: CYSTOSCOPY WITH RETROGRADE PYELOGRAM, URETEROSCOPY  AND STENT EXCHANGE;  Surgeon: Molli Hazard, MD;  [facility];  Service: Urology;  Laterality: Right;  . EXPLORATION POST OPERATIVE OPEN HEART N/A 09/17/2013   Procedure: EXPLORATION POST OPERATIVE OPEN HEART;  Surgeon: Melrose Nakayama, MD;  Location: [place];  Service: Open Heart Surgery;  Laterality: N/A;  . HOLMIUM LASER APPLICATION Right 04/08/7208   Procedure: HOLMIUM LASER APPLICATION;  Surgeon: Molli Hazard, MD;  [facility];  Service: Urology;  Laterality: Right;  . LEFT HEART CATHETERIZATION WITH CORONARY ANGIOGRAM N/A 09/14/2013   Procedure: LEFT HEART CATHETERIZATION WITH CORONARY ANGIOGRAM;  Surgeon: Jacolyn Reedy, MD;  Location: [facility] CATH [facility];  Service: Cardiovascular;  Laterality: N/A;    Current Outpatient Prescriptions  Medication Sig Dispense Refill  . allopurinol (  ZYLOPRIM) 300 MG tablet Take 300 mg by mouth at bedtime.     [name] aspirin EC 325 MG tablet Take 325 mg by mouth at bedtime.    [name] atorvastatin (LIPITOR) 40 MG tablet Take 40 mg by mouth daily.    . cephALEXin (KEFLEX) 500 MG capsule Take 1 capsule (500 mg total) by mouth 2 (two) times daily. 20 capsule 0  . famotidine (PEPCID) 20 MG tablet Take 20 mg by mouth daily.    [name] glucosamine-chondroitin 500-400 MG tablet Take 1 tablet by mouth daily.     [name] levothyroxine (SYNTHROID, LEVOTHROID) 88 MCG tablet Take 88 mcg by mouth daily before breakfast.    . metoprolol (LOPRESSOR) 50 MG tablet Take 50 mg by mouth 2 (two) times daily.    . Multiple Vitamins-Minerals (MULTIVITAMIN WITH MINERALS) tablet Take 1 tablet by mouth daily.      [name] OVER THE COUNTER MEDICATION Simply saline nasal spray    . oxyCODONE-acetaminophen (PERCOCET/ROXICET) 5-325 MG tablet Take 2 tablets by mouth every 4 (four) hours as needed for severe pain. 15 tablet 0  . senna-docusate (SENOKOT S) 8.6-50 MG per tablet Take 1 tablet by mouth 2 (two) times daily. 60 tablet 0  . sodium chloride (OCEAN) 0.65 % SOLN nasal spray Place 2  sprays into both nostrils at bedtime as needed for congestion.    . temazepam (RESTORIL) 15 MG capsule Take 15 mg by mouth at bedtime as needed for sleep.    [name] ALPRAZolam (XANAX) 0.5 MG tablet Take 0.5 mg by mouth 2 (two) times daily as needed for anxiety.     No current facility-administered medications for this visit.     Allergies as of 01/13/2017 - Review Complete 01/13/2017  Allergen Reaction Noted  . Ace inhibitors Cough 09/13/2013    Vitals: BP 126/72   Pulse 78   Resp 16   Ht 5' 9"  (1.753 m)   Wt 253 lb (114.8 kg)   BMI 37.36 kg/m  Last Weight:  Wt Readings from Last 1 Encounters:  01/13/17 253 lb (114.8 kg)   EXN:TZGY mass index is 37.36 kg/m.     Last Height:   Ht Readings from Last 1 Encounters:  01/13/17 5' 9"  (1.753 m)    Physical exam:  General: The patient is awake, alert and appears not in acute distress. The patient is well groomed. Head: Normocephalic, atraumatic. Neck is supple. Mallampati 4. neck circumference: 20. Nasal airflow patent ,Retrognathia is not seen.  Cardiovascular:  Regular rate and rhythm , without  murmurs or carotid bruit, and without distended neck veins. Respiratory: Lungs are clear to auscultation. Skin:  Without evidence of edema, or rash Trunk: BMI is 37 Neurologic exam : The patient is awake and alert, oriented to place and time.    Cranial nerves: Pupils are equal and briskly reactive to light. Facial motor strength is symmetric and tongue and uvula move midline. Shoulder shrug was symmetrical.  Motor exam:   Normal tone, muscle bulk and symmetric strength in all extremities. Gait and station: Patient walks without assistive device and is able unassisted to climb up to the exam table.  Strength within normal limits.  Deep tendon reflexes: in the  upper and lower extremities are symmetric and intact.   The patient was advised of the nature of the diagnosed sleep disorder , the treatment options and risks for general a health  and wellness arising from not treating the condition.  I spent more than 25  minutes of face  to face time with the patient. Greater than 50% of time was spent in counseling and coordination of care. We have discussed the diagnosis and differential and I answered the patient's questions.     Assessment:  After physical and neurologic examination, review of laboratory studies,  Personal review of imaging studies, reports of other /same  Imaging studies ,  Results of polysomnography/ neurophysiology testing and pre-existing records as far as provided in visit., my assessment is :    1) OSA on CPAP - well controlled,  using an Autoset 7-12 cm water, 93% compliance.     Plan:  Treatment plan and additional workup :  Keep using CPAP ! See You in 12 month   I will contact Mrs. Schemm about her CPAP experience.     Asencion Partridge Edyn Qazi MD  01/13/2017   CC: Marton Redwood, [place],  [id number]

## 2017-01-28 DIAGNOSIS — G4733 Obstructive sleep apnea (adult) (pediatric): Secondary | ICD-10-CM | POA: Diagnosis not present

## 2017-01-31 DIAGNOSIS — E038 Other specified hypothyroidism: Secondary | ICD-10-CM | POA: Diagnosis not present

## 2017-01-31 DIAGNOSIS — M109 Gout, unspecified: Secondary | ICD-10-CM | POA: Diagnosis not present

## 2017-01-31 DIAGNOSIS — E784 Other hyperlipidemia: Secondary | ICD-10-CM | POA: Diagnosis not present

## 2017-01-31 DIAGNOSIS — Z125 Encounter for screening for malignant neoplasm of prostate: Secondary | ICD-10-CM | POA: Diagnosis not present

## 2017-01-31 DIAGNOSIS — I10 Essential (primary) hypertension: Secondary | ICD-10-CM | POA: Diagnosis not present

## 2017-01-31 DIAGNOSIS — R7301 Impaired fasting glucose: Secondary | ICD-10-CM | POA: Diagnosis not present

## 2017-02-02 DIAGNOSIS — D631 Anemia in chronic kidney disease: Secondary | ICD-10-CM | POA: Diagnosis not present

## 2017-02-02 DIAGNOSIS — E039 Hypothyroidism, unspecified: Secondary | ICD-10-CM | POA: Diagnosis not present

## 2017-02-02 DIAGNOSIS — N183 Chronic kidney disease, stage 3 (moderate): Secondary | ICD-10-CM | POA: Diagnosis not present

## 2017-02-07 DIAGNOSIS — Z6835 Body mass index (BMI) 35.0-35.9, adult: Secondary | ICD-10-CM | POA: Diagnosis not present

## 2017-02-07 DIAGNOSIS — G4733 Obstructive sleep apnea (adult) (pediatric): Secondary | ICD-10-CM | POA: Diagnosis not present

## 2017-02-07 DIAGNOSIS — E784 Other hyperlipidemia: Secondary | ICD-10-CM | POA: Diagnosis not present

## 2017-02-07 DIAGNOSIS — Z0000 Encounter for general adult medical examination without abnormal findings: Secondary | ICD-10-CM | POA: Diagnosis not present

## 2017-02-07 DIAGNOSIS — G4734 Idiopathic sleep related nonobstructive alveolar hypoventilation: Secondary | ICD-10-CM | POA: Diagnosis not present

## 2017-02-07 DIAGNOSIS — M109 Gout, unspecified: Secondary | ICD-10-CM | POA: Diagnosis not present

## 2017-02-07 DIAGNOSIS — I25810 Atherosclerosis of coronary artery bypass graft(s) without angina pectoris: Secondary | ICD-10-CM | POA: Diagnosis not present

## 2017-02-07 DIAGNOSIS — E668 Other obesity: Secondary | ICD-10-CM | POA: Diagnosis not present

## 2017-02-07 DIAGNOSIS — I10 Essential (primary) hypertension: Secondary | ICD-10-CM | POA: Diagnosis not present

## 2017-02-07 DIAGNOSIS — R7301 Impaired fasting glucose: Secondary | ICD-10-CM | POA: Diagnosis not present

## 2017-02-07 DIAGNOSIS — E038 Other specified hypothyroidism: Secondary | ICD-10-CM | POA: Diagnosis not present

## 2017-02-07 DIAGNOSIS — Z1389 Encounter for screening for other disorder: Secondary | ICD-10-CM | POA: Diagnosis not present

## 2017-02-27 DIAGNOSIS — G4733 Obstructive sleep apnea (adult) (pediatric): Secondary | ICD-10-CM | POA: Diagnosis not present

## 2017-03-07 DIAGNOSIS — G4733 Obstructive sleep apnea (adult) (pediatric): Secondary | ICD-10-CM | POA: Diagnosis not present

## 2017-03-30 DIAGNOSIS — G4733 Obstructive sleep apnea (adult) (pediatric): Secondary | ICD-10-CM | POA: Diagnosis not present

## 2017-04-19 DIAGNOSIS — D4101 Neoplasm of uncertain behavior of right kidney: Secondary | ICD-10-CM | POA: Diagnosis not present

## 2017-04-19 DIAGNOSIS — N281 Cyst of kidney, acquired: Secondary | ICD-10-CM | POA: Diagnosis not present

## 2017-04-29 DIAGNOSIS — G4733 Obstructive sleep apnea (adult) (pediatric): Secondary | ICD-10-CM | POA: Diagnosis not present

## 2017-05-03 DIAGNOSIS — R1111 Vomiting without nausea: Secondary | ICD-10-CM | POA: Diagnosis not present

## 2017-05-03 DIAGNOSIS — D4101 Neoplasm of uncertain behavior of right kidney: Secondary | ICD-10-CM | POA: Diagnosis not present

## 2017-05-12 DIAGNOSIS — L821 Other seborrheic keratosis: Secondary | ICD-10-CM | POA: Diagnosis not present

## 2017-05-12 DIAGNOSIS — L820 Inflamed seborrheic keratosis: Secondary | ICD-10-CM | POA: Diagnosis not present

## 2017-05-12 DIAGNOSIS — D485 Neoplasm of uncertain behavior of skin: Secondary | ICD-10-CM | POA: Diagnosis not present

## 2017-05-12 DIAGNOSIS — L910 Hypertrophic scar: Secondary | ICD-10-CM | POA: Diagnosis not present

## 2017-05-12 DIAGNOSIS — C4361 Malignant melanoma of right upper limb, including shoulder: Secondary | ICD-10-CM | POA: Diagnosis not present

## 2017-05-30 DIAGNOSIS — G4733 Obstructive sleep apnea (adult) (pediatric): Secondary | ICD-10-CM | POA: Diagnosis not present

## 2017-05-31 DIAGNOSIS — I252 Old myocardial infarction: Secondary | ICD-10-CM | POA: Diagnosis not present

## 2017-05-31 DIAGNOSIS — G4733 Obstructive sleep apnea (adult) (pediatric): Secondary | ICD-10-CM | POA: Diagnosis not present

## 2017-05-31 DIAGNOSIS — N183 Chronic kidney disease, stage 3 (moderate): Secondary | ICD-10-CM | POA: Diagnosis not present

## 2017-05-31 DIAGNOSIS — E668 Other obesity: Secondary | ICD-10-CM | POA: Diagnosis not present

## 2017-05-31 DIAGNOSIS — E784 Other hyperlipidemia: Secondary | ICD-10-CM | POA: Diagnosis not present

## 2017-05-31 DIAGNOSIS — I119 Hypertensive heart disease without heart failure: Secondary | ICD-10-CM | POA: Diagnosis not present

## 2017-05-31 DIAGNOSIS — M109 Gout, unspecified: Secondary | ICD-10-CM | POA: Diagnosis not present

## 2017-05-31 DIAGNOSIS — Z951 Presence of aortocoronary bypass graft: Secondary | ICD-10-CM | POA: Diagnosis not present

## 2017-05-31 DIAGNOSIS — I2510 Atherosclerotic heart disease of native coronary artery without angina pectoris: Secondary | ICD-10-CM | POA: Diagnosis not present

## 2017-06-09 DIAGNOSIS — Z23 Encounter for immunization: Secondary | ICD-10-CM | POA: Diagnosis not present

## 2017-06-13 DIAGNOSIS — C4361 Malignant melanoma of right upper limb, including shoulder: Secondary | ICD-10-CM | POA: Diagnosis not present

## 2017-06-13 DIAGNOSIS — D0361 Melanoma in situ of right upper limb, including shoulder: Secondary | ICD-10-CM | POA: Diagnosis not present

## 2017-06-13 DIAGNOSIS — Z85820 Personal history of malignant melanoma of skin: Secondary | ICD-10-CM | POA: Diagnosis not present

## 2017-06-15 DIAGNOSIS — G4733 Obstructive sleep apnea (adult) (pediatric): Secondary | ICD-10-CM | POA: Diagnosis not present

## 2017-06-30 DIAGNOSIS — G4733 Obstructive sleep apnea (adult) (pediatric): Secondary | ICD-10-CM | POA: Diagnosis not present

## 2017-07-28 DIAGNOSIS — S83242A Other tear of medial meniscus, current injury, left knee, initial encounter: Secondary | ICD-10-CM | POA: Diagnosis not present

## 2017-07-28 DIAGNOSIS — M7061 Trochanteric bursitis, right hip: Secondary | ICD-10-CM | POA: Diagnosis not present

## 2017-07-30 DIAGNOSIS — G4733 Obstructive sleep apnea (adult) (pediatric): Secondary | ICD-10-CM | POA: Diagnosis not present

## 2017-08-09 DIAGNOSIS — M1000 Idiopathic gout, unspecified site: Secondary | ICD-10-CM | POA: Diagnosis not present

## 2017-08-09 DIAGNOSIS — E7849 Other hyperlipidemia: Secondary | ICD-10-CM | POA: Diagnosis not present

## 2017-08-09 DIAGNOSIS — I10 Essential (primary) hypertension: Secondary | ICD-10-CM | POA: Diagnosis not present

## 2017-08-09 DIAGNOSIS — I25810 Atherosclerosis of coronary artery bypass graft(s) without angina pectoris: Secondary | ICD-10-CM | POA: Diagnosis not present

## 2017-08-09 DIAGNOSIS — R7301 Impaired fasting glucose: Secondary | ICD-10-CM | POA: Diagnosis not present

## 2017-08-09 DIAGNOSIS — E038 Other specified hypothyroidism: Secondary | ICD-10-CM | POA: Diagnosis not present

## 2017-08-09 DIAGNOSIS — R252 Cramp and spasm: Secondary | ICD-10-CM | POA: Diagnosis not present

## 2017-08-09 DIAGNOSIS — Z6834 Body mass index (BMI) 34.0-34.9, adult: Secondary | ICD-10-CM | POA: Diagnosis not present

## 2017-08-30 DIAGNOSIS — G4733 Obstructive sleep apnea (adult) (pediatric): Secondary | ICD-10-CM | POA: Diagnosis not present

## 2017-09-06 DIAGNOSIS — M7061 Trochanteric bursitis, right hip: Secondary | ICD-10-CM | POA: Diagnosis not present

## 2017-09-09 DIAGNOSIS — M25562 Pain in left knee: Secondary | ICD-10-CM | POA: Diagnosis not present

## 2017-09-29 DIAGNOSIS — G4733 Obstructive sleep apnea (adult) (pediatric): Secondary | ICD-10-CM | POA: Diagnosis not present

## 2017-10-30 DIAGNOSIS — G4733 Obstructive sleep apnea (adult) (pediatric): Secondary | ICD-10-CM | POA: Diagnosis not present

## 2017-11-30 DIAGNOSIS — G4733 Obstructive sleep apnea (adult) (pediatric): Secondary | ICD-10-CM | POA: Diagnosis not present

## 2017-12-02 DIAGNOSIS — M79644 Pain in right finger(s): Secondary | ICD-10-CM | POA: Diagnosis not present

## 2017-12-02 DIAGNOSIS — R2231 Localized swelling, mass and lump, right upper limb: Secondary | ICD-10-CM | POA: Diagnosis not present

## 2017-12-02 DIAGNOSIS — M19041 Primary osteoarthritis, right hand: Secondary | ICD-10-CM | POA: Insufficient documentation

## 2017-12-07 DIAGNOSIS — G8918 Other acute postprocedural pain: Secondary | ICD-10-CM | POA: Diagnosis not present

## 2017-12-07 DIAGNOSIS — M23222 Derangement of posterior horn of medial meniscus due to old tear or injury, left knee: Secondary | ICD-10-CM | POA: Diagnosis not present

## 2017-12-07 DIAGNOSIS — M7061 Trochanteric bursitis, right hip: Secondary | ICD-10-CM | POA: Diagnosis not present

## 2017-12-07 DIAGNOSIS — M23252 Derangement of posterior horn of lateral meniscus due to old tear or injury, left knee: Secondary | ICD-10-CM | POA: Diagnosis not present

## 2017-12-07 DIAGNOSIS — M94262 Chondromalacia, left knee: Secondary | ICD-10-CM | POA: Diagnosis not present

## 2017-12-15 DIAGNOSIS — M25562 Pain in left knee: Secondary | ICD-10-CM | POA: Diagnosis not present

## 2018-01-02 DIAGNOSIS — M19041 Primary osteoarthritis, right hand: Secondary | ICD-10-CM | POA: Diagnosis not present

## 2018-01-02 DIAGNOSIS — R2231 Localized swelling, mass and lump, right upper limb: Secondary | ICD-10-CM | POA: Diagnosis not present

## 2018-01-03 ENCOUNTER — Other Ambulatory Visit: Payer: Self-pay | Admitting: Orthopedic Surgery

## 2018-01-12 ENCOUNTER — Encounter (HOSPITAL_BASED_OUTPATIENT_CLINIC_OR_DEPARTMENT_OTHER): Admission: RE | Payer: Self-pay | Source: Ambulatory Visit

## 2018-01-12 ENCOUNTER — Ambulatory Visit (HOSPITAL_BASED_OUTPATIENT_CLINIC_OR_DEPARTMENT_OTHER): Admission: RE | Admit: 2018-01-12 | Payer: PPO | Source: Ambulatory Visit | Admitting: Orthopedic Surgery

## 2018-01-12 DIAGNOSIS — M25562 Pain in left knee: Secondary | ICD-10-CM | POA: Diagnosis not present

## 2018-01-12 SURGERY — EXCISION MASS
Anesthesia: Regional | Laterality: Right

## 2018-02-16 DIAGNOSIS — M109 Gout, unspecified: Secondary | ICD-10-CM | POA: Diagnosis not present

## 2018-02-16 DIAGNOSIS — I10 Essential (primary) hypertension: Secondary | ICD-10-CM | POA: Diagnosis not present

## 2018-02-16 DIAGNOSIS — R82998 Other abnormal findings in urine: Secondary | ICD-10-CM | POA: Diagnosis not present

## 2018-02-16 DIAGNOSIS — E038 Other specified hypothyroidism: Secondary | ICD-10-CM | POA: Diagnosis not present

## 2018-02-16 DIAGNOSIS — E7849 Other hyperlipidemia: Secondary | ICD-10-CM | POA: Diagnosis not present

## 2018-02-16 DIAGNOSIS — R7301 Impaired fasting glucose: Secondary | ICD-10-CM | POA: Diagnosis not present

## 2018-02-16 DIAGNOSIS — Z125 Encounter for screening for malignant neoplasm of prostate: Secondary | ICD-10-CM | POA: Diagnosis not present

## 2018-02-20 DIAGNOSIS — M25562 Pain in left knee: Secondary | ICD-10-CM | POA: Diagnosis not present

## 2018-02-22 DIAGNOSIS — I10 Essential (primary) hypertension: Secondary | ICD-10-CM | POA: Diagnosis not present

## 2018-02-22 DIAGNOSIS — F325 Major depressive disorder, single episode, in full remission: Secondary | ICD-10-CM | POA: Diagnosis not present

## 2018-02-22 DIAGNOSIS — R808 Other proteinuria: Secondary | ICD-10-CM | POA: Diagnosis not present

## 2018-02-22 DIAGNOSIS — E291 Testicular hypofunction: Secondary | ICD-10-CM | POA: Diagnosis not present

## 2018-02-22 DIAGNOSIS — Z1389 Encounter for screening for other disorder: Secondary | ICD-10-CM | POA: Diagnosis not present

## 2018-02-22 DIAGNOSIS — N183 Chronic kidney disease, stage 3 (moderate): Secondary | ICD-10-CM | POA: Diagnosis not present

## 2018-02-22 DIAGNOSIS — M6281 Muscle weakness (generalized): Secondary | ICD-10-CM | POA: Diagnosis not present

## 2018-02-22 DIAGNOSIS — I25810 Atherosclerosis of coronary artery bypass graft(s) without angina pectoris: Secondary | ICD-10-CM | POA: Diagnosis not present

## 2018-02-22 DIAGNOSIS — Z0000 Encounter for general adult medical examination without abnormal findings: Secondary | ICD-10-CM | POA: Diagnosis not present

## 2018-02-22 DIAGNOSIS — F1320 Sedative, hypnotic or anxiolytic dependence, uncomplicated: Secondary | ICD-10-CM | POA: Diagnosis not present

## 2018-02-22 DIAGNOSIS — C4360 Malignant melanoma of unspecified upper limb, including shoulder: Secondary | ICD-10-CM | POA: Diagnosis not present

## 2018-02-22 DIAGNOSIS — Z6834 Body mass index (BMI) 34.0-34.9, adult: Secondary | ICD-10-CM | POA: Diagnosis not present

## 2018-03-20 DIAGNOSIS — M7582 Other shoulder lesions, left shoulder: Secondary | ICD-10-CM | POA: Diagnosis not present

## 2018-03-20 DIAGNOSIS — M1712 Unilateral primary osteoarthritis, left knee: Secondary | ICD-10-CM | POA: Diagnosis not present

## 2018-06-01 DIAGNOSIS — N183 Chronic kidney disease, stage 3 (moderate): Secondary | ICD-10-CM | POA: Diagnosis not present

## 2018-06-01 DIAGNOSIS — I119 Hypertensive heart disease without heart failure: Secondary | ICD-10-CM | POA: Diagnosis not present

## 2018-06-01 DIAGNOSIS — E785 Hyperlipidemia, unspecified: Secondary | ICD-10-CM | POA: Diagnosis not present

## 2018-06-01 DIAGNOSIS — Z951 Presence of aortocoronary bypass graft: Secondary | ICD-10-CM | POA: Diagnosis not present

## 2018-06-01 DIAGNOSIS — M109 Gout, unspecified: Secondary | ICD-10-CM | POA: Diagnosis not present

## 2018-06-01 DIAGNOSIS — I2510 Atherosclerotic heart disease of native coronary artery without angina pectoris: Secondary | ICD-10-CM | POA: Diagnosis not present

## 2018-06-01 DIAGNOSIS — I252 Old myocardial infarction: Secondary | ICD-10-CM | POA: Diagnosis not present

## 2018-06-01 DIAGNOSIS — G4733 Obstructive sleep apnea (adult) (pediatric): Secondary | ICD-10-CM | POA: Diagnosis not present

## 2018-07-04 DIAGNOSIS — M1712 Unilateral primary osteoarthritis, left knee: Secondary | ICD-10-CM | POA: Diagnosis not present

## 2018-07-04 DIAGNOSIS — S46012A Strain of muscle(s) and tendon(s) of the rotator cuff of left shoulder, initial encounter: Secondary | ICD-10-CM | POA: Diagnosis not present

## 2018-07-10 DIAGNOSIS — M25512 Pain in left shoulder: Secondary | ICD-10-CM | POA: Diagnosis not present

## 2018-07-11 DIAGNOSIS — M7582 Other shoulder lesions, left shoulder: Secondary | ICD-10-CM | POA: Diagnosis not present

## 2018-07-11 DIAGNOSIS — M1712 Unilateral primary osteoarthritis, left knee: Secondary | ICD-10-CM | POA: Diagnosis not present

## 2018-07-11 DIAGNOSIS — M7061 Trochanteric bursitis, right hip: Secondary | ICD-10-CM | POA: Diagnosis not present

## 2018-08-21 DIAGNOSIS — R05 Cough: Secondary | ICD-10-CM | POA: Diagnosis not present

## 2018-08-21 DIAGNOSIS — R7301 Impaired fasting glucose: Secondary | ICD-10-CM | POA: Diagnosis not present

## 2018-08-21 DIAGNOSIS — E7849 Other hyperlipidemia: Secondary | ICD-10-CM | POA: Diagnosis not present

## 2018-08-21 DIAGNOSIS — Z23 Encounter for immunization: Secondary | ICD-10-CM | POA: Diagnosis not present

## 2018-08-21 DIAGNOSIS — I10 Essential (primary) hypertension: Secondary | ICD-10-CM | POA: Diagnosis not present

## 2018-08-21 DIAGNOSIS — M6281 Muscle weakness (generalized): Secondary | ICD-10-CM | POA: Diagnosis not present

## 2018-08-21 DIAGNOSIS — Z6834 Body mass index (BMI) 34.0-34.9, adult: Secondary | ICD-10-CM | POA: Diagnosis not present

## 2018-08-21 DIAGNOSIS — E038 Other specified hypothyroidism: Secondary | ICD-10-CM | POA: Diagnosis not present

## 2018-10-10 DIAGNOSIS — M1712 Unilateral primary osteoarthritis, left knee: Secondary | ICD-10-CM | POA: Diagnosis not present

## 2018-10-10 DIAGNOSIS — M7061 Trochanteric bursitis, right hip: Secondary | ICD-10-CM | POA: Diagnosis not present

## 2018-12-26 DIAGNOSIS — Z85820 Personal history of malignant melanoma of skin: Secondary | ICD-10-CM | POA: Diagnosis not present

## 2018-12-26 DIAGNOSIS — L570 Actinic keratosis: Secondary | ICD-10-CM | POA: Diagnosis not present

## 2018-12-26 DIAGNOSIS — L821 Other seborrheic keratosis: Secondary | ICD-10-CM | POA: Diagnosis not present

## 2018-12-26 DIAGNOSIS — D229 Melanocytic nevi, unspecified: Secondary | ICD-10-CM | POA: Diagnosis not present

## 2018-12-26 DIAGNOSIS — D485 Neoplasm of uncertain behavior of skin: Secondary | ICD-10-CM | POA: Diagnosis not present

## 2018-12-26 DIAGNOSIS — D0461 Carcinoma in situ of skin of right upper limb, including shoulder: Secondary | ICD-10-CM | POA: Diagnosis not present

## 2018-12-26 DIAGNOSIS — L820 Inflamed seborrheic keratosis: Secondary | ICD-10-CM | POA: Diagnosis not present

## 2019-01-19 DIAGNOSIS — G4733 Obstructive sleep apnea (adult) (pediatric): Secondary | ICD-10-CM | POA: Diagnosis not present

## 2019-02-22 DIAGNOSIS — E7849 Other hyperlipidemia: Secondary | ICD-10-CM | POA: Diagnosis not present

## 2019-02-22 DIAGNOSIS — E038 Other specified hypothyroidism: Secondary | ICD-10-CM | POA: Diagnosis not present

## 2019-02-22 DIAGNOSIS — M109 Gout, unspecified: Secondary | ICD-10-CM | POA: Diagnosis not present

## 2019-02-22 DIAGNOSIS — R7301 Impaired fasting glucose: Secondary | ICD-10-CM | POA: Diagnosis not present

## 2019-02-22 DIAGNOSIS — Z125 Encounter for screening for malignant neoplasm of prostate: Secondary | ICD-10-CM | POA: Diagnosis not present

## 2019-02-22 DIAGNOSIS — I10 Essential (primary) hypertension: Secondary | ICD-10-CM | POA: Diagnosis not present

## 2019-03-05 DIAGNOSIS — Z1331 Encounter for screening for depression: Secondary | ICD-10-CM | POA: Diagnosis not present

## 2019-03-05 DIAGNOSIS — I25810 Atherosclerosis of coronary artery bypass graft(s) without angina pectoris: Secondary | ICD-10-CM | POA: Diagnosis not present

## 2019-03-05 DIAGNOSIS — M109 Gout, unspecified: Secondary | ICD-10-CM | POA: Diagnosis not present

## 2019-03-05 DIAGNOSIS — F1320 Sedative, hypnotic or anxiolytic dependence, uncomplicated: Secondary | ICD-10-CM | POA: Diagnosis not present

## 2019-03-05 DIAGNOSIS — E039 Hypothyroidism, unspecified: Secondary | ICD-10-CM | POA: Diagnosis not present

## 2019-03-05 DIAGNOSIS — G4700 Insomnia, unspecified: Secondary | ICD-10-CM | POA: Diagnosis not present

## 2019-03-05 DIAGNOSIS — R7301 Impaired fasting glucose: Secondary | ICD-10-CM | POA: Diagnosis not present

## 2019-03-05 DIAGNOSIS — Z1339 Encounter for screening examination for other mental health and behavioral disorders: Secondary | ICD-10-CM | POA: Diagnosis not present

## 2019-03-05 DIAGNOSIS — N183 Chronic kidney disease, stage 3 (moderate): Secondary | ICD-10-CM | POA: Diagnosis not present

## 2019-03-05 DIAGNOSIS — E785 Hyperlipidemia, unspecified: Secondary | ICD-10-CM | POA: Diagnosis not present

## 2019-03-05 DIAGNOSIS — Z0000 Encounter for general adult medical examination without abnormal findings: Secondary | ICD-10-CM | POA: Diagnosis not present

## 2019-03-05 DIAGNOSIS — I10 Essential (primary) hypertension: Secondary | ICD-10-CM | POA: Diagnosis not present

## 2019-03-05 DIAGNOSIS — G4733 Obstructive sleep apnea (adult) (pediatric): Secondary | ICD-10-CM | POA: Diagnosis not present

## 2019-03-07 DIAGNOSIS — I10 Essential (primary) hypertension: Secondary | ICD-10-CM | POA: Diagnosis not present

## 2019-03-07 DIAGNOSIS — R82998 Other abnormal findings in urine: Secondary | ICD-10-CM | POA: Diagnosis not present

## 2019-03-12 DIAGNOSIS — M1712 Unilateral primary osteoarthritis, left knee: Secondary | ICD-10-CM | POA: Diagnosis not present

## 2019-03-12 DIAGNOSIS — M7061 Trochanteric bursitis, right hip: Secondary | ICD-10-CM | POA: Diagnosis not present

## 2019-06-16 DIAGNOSIS — Z23 Encounter for immunization: Secondary | ICD-10-CM | POA: Diagnosis not present

## 2019-07-18 DIAGNOSIS — G4733 Obstructive sleep apnea (adult) (pediatric): Secondary | ICD-10-CM | POA: Diagnosis not present

## 2019-08-28 DIAGNOSIS — Z85820 Personal history of malignant melanoma of skin: Secondary | ICD-10-CM | POA: Diagnosis not present

## 2019-08-28 DIAGNOSIS — D692 Other nonthrombocytopenic purpura: Secondary | ICD-10-CM | POA: Diagnosis not present

## 2019-08-28 DIAGNOSIS — L820 Inflamed seborrheic keratosis: Secondary | ICD-10-CM | POA: Diagnosis not present

## 2019-08-28 DIAGNOSIS — L821 Other seborrheic keratosis: Secondary | ICD-10-CM | POA: Diagnosis not present

## 2019-08-28 DIAGNOSIS — L570 Actinic keratosis: Secondary | ICD-10-CM | POA: Diagnosis not present

## 2019-11-14 ENCOUNTER — Ambulatory Visit (INDEPENDENT_AMBULATORY_CARE_PROVIDER_SITE_OTHER): Payer: PPO | Admitting: Cardiology

## 2019-11-14 ENCOUNTER — Other Ambulatory Visit: Payer: Self-pay

## 2019-11-14 ENCOUNTER — Encounter: Payer: Self-pay | Admitting: Cardiology

## 2019-11-14 VITALS — BP 122/84 | HR 72 | Temp 96.5°F | Ht 69.0 in | Wt 254.7 lb

## 2019-11-14 DIAGNOSIS — Z951 Presence of aortocoronary bypass graft: Secondary | ICD-10-CM

## 2019-11-14 DIAGNOSIS — I2510 Atherosclerotic heart disease of native coronary artery without angina pectoris: Secondary | ICD-10-CM | POA: Diagnosis not present

## 2019-11-14 DIAGNOSIS — Z6835 Body mass index (BMI) 35.0-35.9, adult: Secondary | ICD-10-CM | POA: Diagnosis not present

## 2019-11-14 DIAGNOSIS — E7800 Pure hypercholesterolemia, unspecified: Secondary | ICD-10-CM | POA: Diagnosis not present

## 2019-11-14 DIAGNOSIS — I10 Essential (primary) hypertension: Secondary | ICD-10-CM | POA: Diagnosis not present

## 2019-11-14 NOTE — Progress Notes (Signed)
Primary Physician/Referring:  Marton Redwood, MD  Patient ID: Dalton Mitchell, male    DOB: [birth year], 74 y.o.   MRN: [id number]  Chief Complaint  Patient presents with  . Coronary Artery Disease  . Hypertension  . Hyperlipidemia   HPI:    Dalton Mitchell  is a 74 y.o. Caucasian male with coronary artery disease, obesity, hypertension, hyperlipidemia, stage IV chronic kidney disease, history of inferior MI in 1995 SP balloon angioplasty to RCA, history of CABG 1215 2014x4 presents here to establish cardiac care.  States that he is presently doing well, denies any shortness of breath or chest pain.  He has not had any leg edema, PND or orthopnea.  His main concern has been gradual decreased exercise tolerance.  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  Past Medical History:  Diagnosis Date  . Adenomatous colon polyp   . CAD    Prior inferior MI treated with stent 1995    . 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 GERD 02/24/2008  . Gout   . History of kidney stones   . Hyperlipidemia   . Hypertensive heart disease   . Hypothyroidism   . Obesity (BMI 30-39.9)   . Old inferior wall myocardial infarction   . Sleep apnea    Past Surgical History:  Procedure Laterality Date  . COLONOSCOPY    . CORONARY ANGIOPLASTY  1995  . CORONARY ANGIOPLASTY WITH STENT PLACEMENT  1996  . CORONARY ARTERY BYPASS GRAFT N/A 09/17/2013   Procedure: CORONARY ARTERY BYPASS GRAFTING (CABG);  Surgeon: Melrose Nakayama, MD;  Location: [facility];  Service: Open Heart Surgery;  Laterality: N/A;  . CYSTOSCOPY WITH RETROGRADE PYELOGRAM, URETEROSCOPY AND STENT PLACEMENT Right 01/11/2014   Procedure: CYSTOSCOPY WITH RETROGRADE PYELOGRAM, URETEROSCOPY AND STENT PLACEMENT;  Surgeon: Molli Hazard, MD;  [facility];  Service: Urology;  Laterality: Right;  . CYSTOSCOPY WITH  RETROGRADE PYELOGRAM, URETEROSCOPY AND STENT PLACEMENT Right 02/01/2014   Procedure: CYSTOSCOPY WITH RETROGRADE PYELOGRAM, URETEROSCOPY AND STENT EXCHANGE;  Surgeon: Molli Hazard, MD;  [facility];  Service: Urology;  Laterality: Right;  . EXPLORATION POST OPERATIVE OPEN HEART N/A 09/17/2013   Procedure: EXPLORATION POST OPERATIVE OPEN HEART;  Surgeon: Melrose Nakayama, MD;  Location: [place];  Service: Open Heart Surgery;  Laterality: N/A;  . HOLMIUM LASER APPLICATION Right 10/09/1759   Procedure: HOLMIUM LASER APPLICATION;  Surgeon: Molli Hazard, MD;  [facility];  Service: Urology;  Laterality: Right;  . LEFT HEART CATHETERIZATION WITH CORONARY ANGIOGRAM N/A 09/14/2013   Procedure: LEFT HEART CATHETERIZATION WITH CORONARY ANGIOGRAM;  Surgeon: Jacolyn Reedy, MD;  Location: [facility] C[facility];  Service: Cardiovascular;  Laterality: N/A;   Social History   Tobacco Use  . Smoking status: Former Research scientist (life sciences)  . Smokeless tobacco: Never Used  . Tobacco comment: Quit 1971  Substance Use Topics  . Alcohol use: No   ROS  Review of Systems  Cardiovascular: Negative for dyspnea on exertion and leg swelling.  Gastrointestinal: Negative for melena.   Objective  Blood pressure 122/84, pulse 72, temperature (!) 96.5 F (35.8 C), height 5\' 9"  (1.753 m), weight 254 lb 11.2 oz (115.5 kg), SpO2 98 %.  Vitals with BMI 11/14/2019 01/13/2017 09/09/2016  Height 5\' 9"  5\' 9"  5\' 9"   Weight 254 lbs 11 oz 253 lbs 257 lbs  BMI 37.6  16.0 38  Systolic 737 106 269  Diastolic 84 72 76  Pulse 72 78 76     Physical Exam  Constitutional:  He is well-developed, moderately obese in no acute distress.  Neck: No thyromegaly present.  Cardiovascular: Normal rate, regular rhythm, normal heart sounds and intact distal pulses. Exam reveals no gallop.  No murmur heard. No leg edema, no JVD.  Pulmonary/Chest: Effort normal and breath sounds normal.  Abdominal: Soft. Bowel sounds are normal.    Musculoskeletal:     Cervical back: Neck supple.  Skin: Skin is warm and dry.   Laboratory examination:   No results for input(s): NA, K, CL, CO2, GLUCOSE, BUN, CREATININE, CALCIUM, GFRNONAA, GFRAA in the last 8760 hours. CrCl cannot be calculated (Patient's most recent lab result is older than the maximum 21 days allowed.).  CMP Latest Ref Rng & Units 02/01/2014 01/25/2014 01/10/2014  Glucose 70 - 99 mg/dL 91 98 106(H)  BUN 6 - 23 mg/dL - 38(H) 35(H)  Creatinine 0.50 - 1.35 mg/dL - 2.51(H) 4.26(H)  Sodium 137 - 147 mEq/L 142 140 140  Potassium 3.7 - 5.3 mEq/L 4.3 5.4(H) 5.3  Chloride 96 - 112 mEq/L - 103 102  CO2 19 - 32 mEq/L - 25 21  Calcium 8.4 - 10.5 mg/dL - 10.5 9.5  Total Protein 6.0 - 8.3 g/dL - - 7.8  Total Bilirubin 0.3 - 1.2 mg/dL - - 0.4  Alkaline Phos 39 - 117 U/L - - 66  AST 0 - 37 U/L - - 17  ALT 0 - 53 U/L - - 12   CBC Latest Ref Rng & Units 02/01/2014 01/25/2014 01/10/2014  WBC 4.0 - 10.5 K/uL - 6.1 8.7  Hemoglobin 13.0 - 17.0 g/dL 14.6 13.1 13.6  Hematocrit 39.0 - 52.0 % 43.0 42.0 42.2  Platelets 150 - 400 K/uL - 235 223   Lipid Panel  No results found for: CHOL, TRIG, HDL, CHOLHDL, VLDL, LDLCALC, LDLDIRECT HEMOGLOBIN A1C Lab Results  Component Value Date   HGBA1C 5.8 (H) 09/21/2013   MPG 120 (H) 09/21/2013   TSH No results for input(s): TSH in the last 8760 hours.  External labs 02/22/2011:  Cholesterol, total 133.000 m 02/22/2019 HDL 36 MG/DL 02/22/2019 LDL 73.000 mg 02/22/2019 Triglycerides 122.000 02/22/2019  A1C 5.800 % 02/22/2019  Hemoglobin 15.000 g/ 02/22/2019 Creatinine, Serum 1.900 mg/ 02/22/2019 Potassium 4.900 mEq 04/19/2017  ALT (SGPT) 21.000 uni 02/22/2019 TSH 1.020 02/22/2019  Medications and allergies   Allergies  Allergen Reactions  . Ace Inhibitors Cough     Current Outpatient Medications  Medication Instructions  . allopurinol (ZYLOPRIM) 300 mg, Daily at bedtime  . ALPRAZolam (XANAX) 0.5 mg, 2 times daily PRN  . aspirin EC 325 mg,  Daily at bedtime  . cetirizine (ZYRTEC ALLERGY) 10 mg, Oral, Daily  . Coenzyme Q10 (COQ-10 PO) 100 mg, Oral, Daily  . ezetimibe (ZETIA) 10 mg, Oral, Daily  . famotidine (PEPCID) 20 mg, Oral, Daily PRN  . FLUTICASONE PROPIONATE, NASAL, NA 2 Inhalers, Nasal, Daily  . HYDROcodone-acetaminophen (NORCO/VICODIN) 5-325 MG tablet 1 tablet, Oral, Every 6 hours PRN  . levothyroxine (SYNTHROID) 88 mcg, Oral, Daily before breakfast  . losartan (COZAAR) 50 MG tablet 1 tablet, Oral, Daily  . metoprolol tartrate (LOPRESSOR) 50 mg, Oral, 2 times daily  . Multiple Vitamins-Minerals (MULTIVITAMIN WITH MINERALS) tablet 1 tablet, Daily  . OVER THE COUNTER MEDICATION Simply saline nasal spray   . OVER THE COUNTER MEDICATION CBD ointment   . oxyCODONE-acetaminophen (PERCOCET/ROXICET) 5-325 MG  tablet 2 tablets, Oral, Every 4 hours PRN  . rosuvastatin (CRESTOR) 20 mg, Oral, Daily  . senna-docusate (SENOKOT S) 8.6-50 MG per tablet 1 tablet, Oral, 2 times daily  . sodium chloride (OCEAN) 0.65 % SOLN nasal spray 2 sprays, At bedtime PRN  . temazepam (RESTORIL) 15 mg, Oral, At bedtime PRN  . TURMERIC PO 1,000 mg, Oral, Daily   Radiology:  No results found.  Cardiac Studies:   Echocardiogram 09/13/2013:   - Left ventricle: The cavity size was normal. Wall thickness  was increased in a pattern of mild LVH. Systolic function  was normal. The estimated ejection fraction was in the  range of 50% to 55%. Wall motion was normal; there were no  regional wall motion abnormalities. Doppler parameters are  consistent with abnormal left ventricular relaxation  (grade 1 diastolic dysfunction).  - Left atrium: The atrium was mildly dilated.   Coronary angiogram 09/14/2013: Proximal LAD 80%, OM1 95% stenosis, proximal RCA subtotal stenosis.  CABG x 4 09/17/2013: Utilizing LIMA to LAD, SVG to OM2, and a sequential SVG to PDA and distal RCA.  Assessment     ICD-10-CM   1. Coronary artery disease involving  native coronary artery of native heart without angina pectoris  I25.10 EKG 12-Lead  2. Hx of CABG x 4 09/17/2013:  LIMA to LAD, SVG to OM2, and SVG to PDA-distal RCA.  Z95.1   3. Primary hypertension  I10   4. Hypercholesteremia  E78.00   5. Class 2 severe obesity due to excess calories with serious comorbidity and body mass index (BMI) of 35.0 to 35.9 in adult [facility])  E66.01    Z68.35     EKG 11/14/2019: Sinus rhythm with first-degree block at the rate of 69 bpm, left atrial enlargement, cannot exclude inferior infarct old.  Incomplete right bundle branch block.  Cannot exclude posterior infarct old.  No evidence of ischemia.  Normal QT interval.     No orders of the defined types were placed in this encounter.   Medications Discontinued During This Encounter  Medication Reason  . atorvastatin (LIPITOR) 40 MG tablet Error  . cephALEXin (KEFLEX) 500 MG capsule Error  . glucosamine-chondroitin 500-400 MG tablet Error    Recommendations:   Dalton Mitchell  is a 75 y.o. Caucasian male with coronary artery disease, obesity, hypertension, hyperlipidemia, stage IV chronic kidney disease, history of inferior MI in 1995 SP balloon angioplasty to RCA, history of CABG 1215 2014x4 presents here to establish cardiac care.  He is essentially asymptomatic except for decreased exercise tolerance.  This has been ongoing for several years.  There is no clinical evidence of heart failure, no EKG changes to suggest ischemia although he does reveal prior old infarct.  Vascular examination is normal as well.  I reviewed his external labs, lipids under excellent control.  I also reviewed his prior reports from the hospitalization and bypass surgery and updated the records.  No changes in the medications are needed for now, I will see him back in 6 months or sooner if problems.  He does have moderate obesity and in view of his underlying coronary artery disease could be considered moderate, we discussed  regarding weight loss.  Adrian Prows, MD, [facility] 11/14/2019, 3:26 PM [facility] [place] Office: [phone]

## 2019-11-22 ENCOUNTER — Ambulatory Visit: Payer: PPO

## 2019-11-24 ENCOUNTER — Ambulatory Visit: Payer: PPO | Attending: Internal Medicine

## 2019-11-24 DIAGNOSIS — Z23 Encounter for immunization: Secondary | ICD-10-CM

## 2019-11-24 NOTE — Progress Notes (Signed)
   Covid-19 Vaccination Clinic  Name:  Dalton Mitchell    MRN: [id number] DOB: 1945/10/15  11/24/2019  [name] was observed post Covid-19 immunization for 15 minutes without incidence. He was provided with Vaccine Information Sheet and instruction to access the V-Safe system.   [name] was instructed to call 911 with any severe reactions post vaccine: [name] Difficulty breathing  . Swelling of your face and throat  . A fast heartbeat  . A bad rash all over your body  . Dizziness and weakness    Immunizations Administered    Name Date Dose VIS Date Route   Pfizer COVID-19 Vaccine 11/24/2019  2:17 PM 0.3 mL 09/14/2019 Intramuscular   Manufacturer: [place]   Lot: [id number]   [place]: [id number]

## 2019-11-26 ENCOUNTER — Ambulatory Visit: Payer: PPO | Attending: Internal Medicine

## 2019-11-27 DIAGNOSIS — N5201 Erectile dysfunction due to arterial insufficiency: Secondary | ICD-10-CM | POA: Diagnosis not present

## 2019-11-27 DIAGNOSIS — D4101 Neoplasm of uncertain behavior of right kidney: Secondary | ICD-10-CM | POA: Diagnosis not present

## 2019-11-27 DIAGNOSIS — N200 Calculus of kidney: Secondary | ICD-10-CM | POA: Diagnosis not present

## 2019-11-27 DIAGNOSIS — N281 Cyst of kidney, acquired: Secondary | ICD-10-CM | POA: Diagnosis not present

## 2019-12-15 ENCOUNTER — Ambulatory Visit: Payer: PPO | Attending: Internal Medicine

## 2019-12-15 DIAGNOSIS — Z23 Encounter for immunization: Secondary | ICD-10-CM

## 2019-12-15 NOTE — Progress Notes (Signed)
   Covid-19 Vaccination Clinic  Name:  Dalton Mitchell    MRN: [id number] DOB: March 18, 1946  12/15/2019  [name] was observed post Covid-19 immunization for 15 minutes without incident. He was provided with Vaccine Information Sheet and instruction to access the V-Safe system.   [name] was instructed to call 911 with any severe reactions post vaccine: [name] Difficulty breathing  . Swelling of face and throat  . A fast heartbeat  . A bad rash all over body  . Dizziness and weakness   Immunizations Administered    Name Date Dose VIS Date Route   Pfizer COVID-19 Vaccine 12/15/2019  2:24 PM 0.3 mL 09/14/2019 Intramuscular   Manufacturer: [place]   Lot: [id number]   [place]: [phone]

## 2019-12-18 ENCOUNTER — Ambulatory Visit: Payer: PPO

## 2019-12-28 ENCOUNTER — Encounter: Payer: Self-pay | Admitting: Internal Medicine

## 2020-01-15 ENCOUNTER — Other Ambulatory Visit: Payer: Self-pay

## 2020-01-15 ENCOUNTER — Ambulatory Visit (AMBULATORY_SURGERY_CENTER): Payer: Self-pay

## 2020-01-15 VITALS — Temp 97.3°F | Ht 69.0 in | Wt 253.0 lb

## 2020-01-15 DIAGNOSIS — Z860100 Personal history of colon polyps, unspecified: Secondary | ICD-10-CM

## 2020-01-15 MED ORDER — SUTAB 1479-225-188 MG PO TABS: 12.0000 | ORAL_TABLET | ORAL | 0 refills | Status: DC

## 2020-01-15 NOTE — Progress Notes (Signed)
No egg or soy allergy known to patient  No issues with past sedation with any surgeries  or procedures, no intubation problems  No diet pills per patient No home 02 use per patient  No blood thinners per patient  Pt denies issues with constipation  No A fib or A flutter  EMMI video sent to pt's e mail   SUTAB COUPON AND CODE USED  PT [place] ON 12/15/19  Due to the COVID-19 pandemic we are asking patients to follow these guidelines. Please only bring one care partner. Please be aware that your care partner may wait in the car in the parking lot or if they feel like they will be too hot to wait in the car, they may wait in the lobby on the 4th floor. All care partners are required to wear a mask the entire time (we do not have any that we can provide them), they need to practice social distancing, and we will do a Covid check for all patient's and care partners when you arrive. Also we will check their temperature and your temperature. If the care partner waits in their car they need to stay in the parking lot the entire time and we will call them on their cell phone when the patient is ready for discharge so they can bring the car to the front of the building. Also all patient's will need to wear a mask into building.

## 2020-01-23 ENCOUNTER — Telehealth: Payer: Self-pay

## 2020-01-23 NOTE — Telephone Encounter (Signed)
Happy to see him during lunch

## 2020-01-23 NOTE — Telephone Encounter (Signed)
Patient wife called to say patient has been having fatigue, very tired, stays in bed almost all day. Patient also has been having chest pain. Pt's BP was 104/ 62   Heart rate 70.Pt wanted to see you

## 2020-01-24 ENCOUNTER — Ambulatory Visit: Payer: PPO | Admitting: Cardiology

## 2020-01-24 ENCOUNTER — Other Ambulatory Visit: Payer: Self-pay

## 2020-01-24 ENCOUNTER — Encounter: Payer: Self-pay | Admitting: Cardiology

## 2020-01-24 VITALS — BP 129/78 | HR 77 | Temp 97.2°F | Resp 16 | Ht 69.0 in | Wt 256.0 lb

## 2020-01-24 DIAGNOSIS — R5381 Other malaise: Secondary | ICD-10-CM | POA: Diagnosis not present

## 2020-01-24 DIAGNOSIS — I10 Essential (primary) hypertension: Secondary | ICD-10-CM | POA: Diagnosis not present

## 2020-01-24 DIAGNOSIS — R0609 Other forms of dyspnea: Secondary | ICD-10-CM | POA: Diagnosis not present

## 2020-01-24 DIAGNOSIS — I2510 Atherosclerotic heart disease of native coronary artery without angina pectoris: Secondary | ICD-10-CM

## 2020-01-24 DIAGNOSIS — R5383 Other fatigue: Secondary | ICD-10-CM

## 2020-01-24 DIAGNOSIS — F411 Generalized anxiety disorder: Secondary | ICD-10-CM

## 2020-01-24 DIAGNOSIS — G4733 Obstructive sleep apnea (adult) (pediatric): Secondary | ICD-10-CM | POA: Diagnosis not present

## 2020-01-24 DIAGNOSIS — Z9989 Dependence on other enabling machines and devices: Secondary | ICD-10-CM | POA: Diagnosis not present

## 2020-01-24 DIAGNOSIS — R0600 Dyspnea, unspecified: Secondary | ICD-10-CM

## 2020-01-24 NOTE — Progress Notes (Signed)
Primary Physician/Referring:  Marton Redwood, MD  Patient ID: Dalton Mitchell, male    DOB: 1946-05-22, 74 y.o.   MRN: [id number]  Chief Complaint  Patient presents with  . Follow-up    chest pain   HPI:    Dalton Mitchell  is a 74 y.o. Caucasian male with coronary artery disease, obesity, hypertension, hyperlipidemia, stage IV chronic kidney disease, history of inferior MI in 1995 SP balloon angioplasty to RCA, history of CABG 1215 2014x4 presents here to be evaluated in urgent basis as his wife left a message stating that he is markedly fatigued, has dyspnea, no energy and does not feel well overall.  He specifically denies chest pain, no PND orthopnea, no leg edema. I had last seen him 6 weeks ago.  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  Lately he does not have any energy and also states that he feels extreme anxiety and depression.  Past Medical History:  Diagnosis Date  . Adenomatous colon polyp   . Allergy    seasonal  . Arthritis   . CAD    Prior inferior MI treated with stent 1995    . 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 Depression    past in early 73s  . GERD 02/24/2008  . Gout   . History of kidney stones   . Hyperlipidemia   . Hypertensive heart disease   . Hypothyroidism   . Obesity (BMI 30-39.9)   . Old inferior wall myocardial infarction   . Sleep apnea    USES CPAP OCCASIONALLY   Past Surgical History:  Procedure Laterality Date  . COLONOSCOPY  12/25/2014   [place] previously  . CORONARY ANGIOPLASTY  1995  . CORONARY ANGIOPLASTY WITH STENT PLACEMENT  1996  . CORONARY ARTERY BYPASS GRAFT N/A 09/17/2013   Procedure: CORONARY ARTERY BYPASS GRAFTING (CABG);  Surgeon: Melrose Nakayama, MD;  Location: [facility];  Service: Open Heart Surgery;  Laterality: N/A;  . CYSTOSCOPY WITH RETROGRADE PYELOGRAM, URETEROSCOPY AND STENT PLACEMENT Right 01/11/2014   Procedure: CYSTOSCOPY  WITH RETROGRADE PYELOGRAM, URETEROSCOPY AND STENT PLACEMENT;  Surgeon: Molli Hazard, MD;  Location: [facility];  Service: Urology;  Laterality: Right;  . CYSTOSCOPY WITH RETROGRADE PYELOGRAM, URETEROSCOPY AND STENT PLACEMENT Right 02/01/2014   Procedure: CYSTOSCOPY WITH RETROGRADE PYELOGRAM, URETEROSCOPY AND STENT EXCHANGE;  Surgeon: Molli Hazard, MD;  Location: [facility];  Service: Urology;  Laterality: Right;  . EXPLORATION POST OPERATIVE OPEN HEART N/A 09/17/2013   Procedure: EXPLORATION POST OPERATIVE OPEN HEART;  Surgeon: Melrose Nakayama, MD;  Location: [facility];  Service: Open Heart Surgery;  Laterality: N/A;  . HOLMIUM LASER APPLICATION Right 03/08/353   Procedure: HOLMIUM LASER APPLICATION;  Surgeon: Molli Hazard, MD;  Location: [facility];  Service: Urology;  Laterality: Right;  . LEFT HEART CATHETERIZATION WITH CORONARY ANGIOGRAM N/A 09/14/2013   Procedure: LEFT HEART CATHETERIZATION WITH CORONARY ANGIOGRAM;  Surgeon: Jacolyn Reedy, MD;  Location: [facility];  Service: Cardiovascular;  Laterality: N/A;  . POLYPECTOMY     Social History   Tobacco Use  . Smoking status: Former Smoker    Quit date: 1970    Years since quitting: 51.3  . Smokeless tobacco: Never Used  . Tobacco comment: Quit 1971  Substance Use Topics  . Alcohol use: No   ROS  Review of Systems  Constitution: Positive for malaise/fatigue.  Cardiovascular: Negative for dyspnea on exertion and  leg swelling.  Respiratory: Positive for snoring (on CPAP and compliant).   Gastrointestinal: Negative for melena.  Psychiatric/Behavioral: Positive for depression. The patient has insomnia and is nervous/anxious.    Objective  Blood pressure 129/78, pulse 77, temperature (!) 97.2 F (36.2 C), temperature source Temporal, resp. rate 16, height 5\' 9"  (1.753 m), weight 256 lb (116.1 kg), SpO2 97 %.  Vitals with BMI 01/24/2020 01/15/2020 11/14/2019  Height 5\' 9"  5\' 9"  5\' 9"   Weight 256 lbs 253 lbs 254 lbs 11  oz  BMI 37.79 56.43 32.9  Systolic 518 - 841  Diastolic 78 - 84  Pulse 77 - 72     Physical Exam  Constitutional:  He is well-developed, moderately obese in no acute distress.  Cardiovascular: Normal rate, regular rhythm, normal heart sounds and intact distal pulses. Exam reveals no gallop.  No murmur heard. No leg edema, no JVD.  Pulmonary/Chest: Effort normal and breath sounds normal.  Abdominal: Soft. Bowel sounds are normal.   Laboratory examination:   No results for input(s): NA, K, CL, CO2, GLUCOSE, BUN, CREATININE, CALCIUM, GFRNONAA, GFRAA in the last 8760 hours. CrCl cannot be calculated (Patient's most recent lab result is older than the maximum 21 days allowed.).  CMP Latest Ref Rng & Units 02/01/2014 01/25/2014 01/10/2014  Glucose 70 - 99 mg/dL 91 98 106(H)  BUN 6 - 23 mg/dL - 38(H) 35(H)  Creatinine 0.50 - 1.35 mg/dL - 2.51(H) 4.26(H)  Sodium 137 - 147 mEq/L 142 140 140  Potassium 3.7 - 5.3 mEq/L 4.3 5.4(H) 5.3  Chloride 96 - 112 mEq/L - 103 102  CO2 19 - 32 mEq/L - 25 21  Calcium 8.4 - 10.5 mg/dL - 10.5 9.5  Total Protein 6.0 - 8.3 g/dL - - 7.8  Total Bilirubin 0.3 - 1.2 mg/dL - - 0.4  Alkaline Phos 39 - 117 U/L - - 66  AST 0 - 37 U/L - - 17  ALT 0 - 53 U/L - - 12   CBC Latest Ref Rng & Units 02/01/2014 01/25/2014 01/10/2014  WBC 4.0 - 10.5 K/uL - 6.1 8.7  Hemoglobin 13.0 - 17.0 g/dL 14.6 13.1 13.6  Hematocrit 39.0 - 52.0 % 43.0 42.0 42.2  Platelets 150 - 400 K/uL - 235 223   Lipid Panel  No results found for: CHOL, TRIG, HDL, CHOLHDL, VLDL, LDLCALC, LDLDIRECT HEMOGLOBIN A1C Lab Results  Component Value Date   HGBA1C 5.8 (H) 09/21/2013   MPG 120 (H) 09/21/2013   TSH No results for input(s): TSH in the last 8760 hours.  External labs 02/22/2011:  Cholesterol, total 133.000 m 02/22/2019 HDL 36 MG/DL 02/22/2019 LDL 73.000 mg 02/22/2019 Triglycerides 122.000 02/22/2019  A1C 5.800 % 02/22/2019  Hemoglobin 15.000 g/ 02/22/2019 Creatinine, Serum 1.900 mg/  02/22/2019 Potassium 4.900 mEq 04/19/2017  ALT (SGPT) 21.000 uni 02/22/2019 TSH 1.020 02/22/2019  Medications and allergies   Allergies  Allergen Reactions  . Ace Inhibitors Cough  . Morphine And Related Other (See Comments)    STATES HORRIBLE HALLUCINATIONS.     Current Outpatient Medications  Medication Instructions  . allopurinol (ZYLOPRIM) 300 mg, Daily at bedtime  . ALPRAZolam (XANAX) 0.5 mg, 2 times daily PRN  . aspirin EC 325 mg, Daily at bedtime  . cetirizine (ZYRTEC ALLERGY) 10 mg, Oral, Daily  . Coenzyme Q10 (COQ-10 PO) 100 mg, Oral, Daily  . ezetimibe (ZETIA) 10 mg, Oral, Daily  . famotidine (PEPCID) 20 mg, Oral, Daily PRN  . FLUTICASONE PROPIONATE, NASAL, NA 2 Inhalers, Nasal, Daily  .  HYDROcodone-acetaminophen (NORCO/VICODIN) 5-325 MG tablet 1 tablet, Oral, Every 6 hours PRN  . levothyroxine (SYNTHROID) 88 mcg, Oral, Daily before breakfast  . losartan (COZAAR) 50 MG tablet 1 tablet, Oral, Daily  . metoprolol tartrate (LOPRESSOR) 50 mg, Oral, 2 times daily  . Multiple Vitamins-Minerals (MULTIVITAMIN WITH MINERALS) tablet 1 tablet, Daily  . OVER THE COUNTER MEDICATION Simply saline nasal spray   . OVER THE COUNTER MEDICATION CBD ointment   . rosuvastatin (CRESTOR) 20 mg, Oral, Daily  . senna-docusate (SENOKOT S) 8.6-50 MG per tablet 1 tablet, Oral, 2 times daily  . sodium chloride (OCEAN) 0.65 % SOLN nasal spray 2 sprays, At bedtime PRN  . SUTAB [phone] MG TABS 12 tablets, Oral, As directed  . temazepam (RESTORIL) 15 mg, Oral, At bedtime PRN  . TURMERIC PO 1,000 mg, Oral, Daily  . zolpidem (AMBIEN) 10 mg, Oral, Daily at bedtime   Radiology:  No results found.  Cardiac Studies:   Echocardiogram 09/13/2013:   - Left ventricle: The cavity size was normal. Wall thickness  was increased in a pattern of mild LVH. Systolic function  was normal. The estimated ejection fraction was in the  range of 50% to 55%. Wall motion was normal; there were no  regional  wall motion abnormalities. Doppler parameters are  consistent with abnormal left ventricular relaxation  (grade 1 diastolic dysfunction).  - Left atrium: The atrium was mildly dilated.   Coronary angiogram 09/14/2013: Proximal LAD 80%, OM1 95% stenosis, proximal RCA subtotal stenosis.  CABG x 4 09/17/2013: Utilizing LIMA to LAD, SVG to OM2, and a sequential SVG to PDA and distal RCA.  Assessment     ICD-10-CM   1. Malaise and fatigue  R53.81 Ambulatory referral to Sleep Studies   R53.83 PCV MYOCARDIAL PERFUSION WO LEXISCAN  2. Generalized anxiety disorder  F41.1   3. Coronary artery disease involving native coronary artery of native heart without angina pectoris  I25.10 PCV MYOCARDIAL PERFUSION WO LEXISCAN  4. Primary hypertension  I10   5. Obstructive sleep apnea on CPAP  G47.33 Ambulatory referral to Sleep Studies   Z99.89   6. Dyspnea on exertion  R06.00 PCV MYOCARDIAL PERFUSION WO LEXISCAN    EKG 11/14/2019: Sinus rhythm with first-degree block at the rate of 69 bpm, left atrial enlargement, cannot exclude inferior infarct old.  Incomplete right bundle branch block.  Cannot exclude posterior infarct old.  No evidence of ischemia.  Normal QT interval.     No orders of the defined types were placed in this encounter.   Medications Discontinued During This Encounter  Medication Reason  . oxyCODONE-acetaminophen (PERCOCET/ROXICET) 5-325 MG tablet Patient Preference    Recommendations:   Dalton Mitchell  is a 74 y.o. Caucasian male with coronary artery disease, obesity, hypertension, hyperlipidemia, stage IV chronic kidney disease, history of inferior MI in 1995 SP balloon angioplasty to RCA, history of CABG 1215 2014x4.  He called [place] to be evaluated on a urgent disease as he has noticed dyspnea and also marked fatigue and malaise and also has had marked depression and anxiety that is very unusual for him. Schedule for a Exercise Nuclear stress test to evaluate for myocardial  ischemia.  Although has dyspnea on exertion, no clinical evidence of heart failure.  Hold Crestor for 2 weeks.  I will set him up for outpatient evaluation of sleep disordered breathing, he was evaluated previously by Dr. Roddie Mc.  Compliant with CPAP use.  Due to anxiety and depression, his son had committed suicide, thoughts  has crossed his mind but does not think he is can commit suicide.  I will request Dr. Carmie Kanner to see the patient on a urgent basis. Office visit following the work-up/investigations.   Adrian Prows, MD, [facility] 01/24/2020, 12:43 PM [facility] [place] Office: [phone]  CC: Izora Ribas (Sleep); Delorise Royals, MD (PCP)

## 2020-01-25 ENCOUNTER — Encounter: Payer: Self-pay | Admitting: Internal Medicine

## 2020-01-29 ENCOUNTER — Ambulatory Visit (AMBULATORY_SURGERY_CENTER): Payer: PPO | Admitting: Internal Medicine

## 2020-01-29 ENCOUNTER — Encounter: Payer: Self-pay | Admitting: Internal Medicine

## 2020-01-29 ENCOUNTER — Other Ambulatory Visit: Payer: Self-pay

## 2020-01-29 VITALS — BP 136/86 | HR 62 | Temp 96.9°F | Resp 13 | Ht 69.0 in | Wt 253.0 lb

## 2020-01-29 DIAGNOSIS — N184 Chronic kidney disease, stage 4 (severe): Secondary | ICD-10-CM | POA: Diagnosis not present

## 2020-01-29 DIAGNOSIS — I2510 Atherosclerotic heart disease of native coronary artery without angina pectoris: Secondary | ICD-10-CM | POA: Diagnosis not present

## 2020-01-29 DIAGNOSIS — K635 Polyp of colon: Secondary | ICD-10-CM

## 2020-01-29 DIAGNOSIS — D122 Benign neoplasm of ascending colon: Secondary | ICD-10-CM

## 2020-01-29 DIAGNOSIS — I129 Hypertensive chronic kidney disease with stage 1 through stage 4 chronic kidney disease, or unspecified chronic kidney disease: Secondary | ICD-10-CM | POA: Diagnosis not present

## 2020-01-29 DIAGNOSIS — G4733 Obstructive sleep apnea (adult) (pediatric): Secondary | ICD-10-CM | POA: Diagnosis not present

## 2020-01-29 DIAGNOSIS — Z86010 Personal history of colonic polyps: Secondary | ICD-10-CM

## 2020-01-29 MED ORDER — SODIUM CHLORIDE 0.9 % IV SOLN: 500.0000 mL | Freq: Once | INTRAVENOUS | Status: DC

## 2020-01-29 NOTE — Op Note (Signed)
[place] Patient Name: Dalton Mitchell Procedure Date: 01/29/2020 2:24 PM MRN: [id number] Endoscopist: Docia Chuck. Henrene Pastor , MD Age: 74 Referring MD:  Date of Birth: Jul 28, 1946 Gender: Male Account #: [id number] Procedure:                Colonoscopy with cold snare polypectomy x 1 Indications:              High risk colon cancer surveillance: Personal                            history of adenoma (10 mm or greater in size), High                            risk colon cancer surveillance: Personal history of                            adenoma with villous component, High risk colon                            cancer surveillance: Personal history of multiple                            (3 or more) adenomas. Previous examinations 2006,                            2008, 2012, 2016 Medicines:                Monitored Anesthesia Care Procedure:                Pre-Anesthesia Assessment:                           - Prior to the procedure, a History and Physical                            was performed, and patient medications and                            allergies were reviewed. The patient's tolerance of                            previous anesthesia was also reviewed. The risks                            and benefits of the procedure and the sedation                            options and risks were discussed with the patient.                            All questions were answered, and informed consent                            was obtained. Prior Anticoagulants: The patient has  taken no previous anticoagulant or antiplatelet                            agents. ASA Grade Assessment: II - A patient with                            mild systemic disease. After reviewing the risks                            and benefits, the patient was deemed in                            satisfactory condition to undergo the procedure.                           After  obtaining informed consent, the colonoscope                            was passed under direct vision. Throughout the                            procedure, the patient's blood pressure, pulse, and                            oxygen saturations were monitored continuously. The                            Colonoscope was introduced through the anus and                            advanced to the the cecum, identified by                            appendiceal orifice and ileocecal valve. Anatomical                            landmarks were photographed. The quality of the                            bowel preparation was excellent. The colonoscopy                            was performed without difficulty. The patient                            tolerated the procedure well. The bowel preparation                            used was SUPREP via split dose instruction. Scope In: 2:31:03 PM Scope Out: 2:41:28 PM Scope Withdrawal Time: 0 hours 8 minutes 48 seconds  Total Procedure Duration: 0 hours 10 minutes 25 seconds  Findings:                 A 4 mm polyp was found in the ascending  colon. The                            polyp was sessile. The polyp was removed with a                            cold snare. Resection and retrieval were complete.                           Internal hemorrhoids were found during                            retroflexion. The hemorrhoids were moderate.                           The exam was otherwise without abnormality on                            direct and retroflexion views. Complications:            No immediate complications. Estimated blood loss:                            None. Estimated Blood Loss:     Estimated blood loss: none. Impression:               - One 4 mm polyp in the ascending colon, removed                            with a cold snare. Resected and retrieved.                           - Internal hemorrhoids.                           - The  examination was otherwise normal on direct                            and retroflexion views. Recommendation:           - Repeat colonoscopy in 5 years for surveillance.                           - Patient has a contact number available for                            emergencies. The signs and symptoms of potential                            delayed complications were discussed with the                            patient. Return to normal activities tomorrow.                            Written discharge instructions were provided to the  patient.                           - Resume previous diet.                           - Continue present medications.                           - Await pathology results. Docia Chuck. Henrene Pastor, MD 01/29/2020 2:45:07 PM This report has been signed electronically.

## 2020-01-29 NOTE — Patient Instructions (Signed)
Please read handouts provided. Continue present medications. Await pathology results.   YOU HAD AN ENDOSCOPIC PROCEDURE TODAY AT THE [facility]:   Refer to the procedure report that was given to you for any specific questions about what was found during the examination.  If the procedure report does not answer your questions, please call your gastroenterologist to clarify.  If you requested that your care partner not be given the details of your procedure findings, then the procedure report has been included in a sealed envelope for you to review at your convenience later.  YOU SHOULD EXPECT: Some feelings of bloating in the abdomen. Passage of more gas than usual.  Walking can help get rid of the air that was put into your GI tract during the procedure and reduce the bloating. If you had a lower endoscopy (such as a colonoscopy or flexible sigmoidoscopy) you may notice spotting of blood in your stool or on the toilet paper. If you underwent a bowel prep for your procedure, you may not have a normal bowel movement for a few days.  Please Note:  You might notice some irritation and congestion in your nose or some drainage.  This is from the oxygen used during your procedure.  There is no need for concern and it should clear up in a day or so.  SYMPTOMS TO REPORT IMMEDIATELY:  Following lower endoscopy (colonoscopy or flexible sigmoidoscopy):  Excessive amounts of blood in the stool  Significant tenderness or worsening of abdominal pains  Swelling of the abdomen that is new, acute  Fever of 100F or higher   For urgent or emergent issues, a gastroenterologist can be reached at any hour by calling [phone]. Do not use MyChart messaging for urgent concerns.    DIET:  We do recommend a small meal at first, but then you may proceed to your regular diet.  Drink plenty of fluids but you should avoid alcoholic beverages for 24 hours.  ACTIVITY:  You should plan to take it easy  for the rest of today and you should NOT DRIVE or use heavy machinery until tomorrow (because of the sedation medicines used during the test).    FOLLOW UP: Our staff will call the number listed on your records 48-72 hours following your procedure to check on you and address any questions or concerns that you may have regarding the information given to you following your procedure. If we do not reach you, we will leave a message.  We will attempt to reach you two times.  During this call, we will ask if you have developed any symptoms of COVID 19. If you develop any symptoms (ie: fever, flu-like symptoms, shortness of breath, cough etc.) before then, please call [phone].  If you test positive for Covid 19 in the 2 weeks post procedure, please call and report this information to us.    If any biopsies were taken you will be contacted by phone or by letter within the next 1-3 weeks.  Please call us at [phone] if you have not heard about the biopsies in 3 weeks.    SIGNATURES/CONFIDENTIALITY: You and/or your care partner have signed paperwork which will be entered into your electronic medical record.  These signatures attest to the fact that that the information above on your After Visit Summary has been reviewed and is understood.  Full responsibility of the confidentiality of this discharge information lies with you and/or your care-partner.  

## 2020-01-29 NOTE — Progress Notes (Signed)
Report to PACU, RN, vss, BBS= Clear.  

## 2020-01-29 NOTE — Progress Notes (Signed)
VS- Nash Mantis Temp- Lisa Clapps  Pt took second dose of covid vaccine greater than 2 weeks ago  Pt's states no medical or surgical changes since previsit or office visit.

## 2020-01-29 NOTE — Progress Notes (Signed)
Called to room to assist during endoscopic procedure.  Patient ID and intended procedure confirmed with present staff. Received instructions for my participation in the procedure from the performing physician.  

## 2020-01-31 ENCOUNTER — Telehealth: Payer: Self-pay

## 2020-01-31 NOTE — Telephone Encounter (Signed)
  Follow up Call-  Call back number 01/29/2020  Post procedure Call Back phone  # [phone]  Permission to leave phone message Yes  Some recent data might be hidden     Patient questions:  Do you have a fever, pain , or abdominal swelling? No. Pain Score  0 *  Have you tolerated food without any problems? Yes.    Have you been able to return to your normal activities? Yes.    Do you have any questions about your discharge instructions: Diet   No. Medications  No. Follow up visit  No.  Do you have questions or concerns about your Care? No.  Actions: * If pain score is 4 or above: No action needed, pain <4.  1. Have you developed a fever since your procedure? no  2.   Have you had an respiratory symptoms (SOB or cough) since your procedure? no  3.   Have you tested positive for COVID 19 since your procedure no  4.   Have you had any family members/close contacts diagnosed with the COVID 19 since your procedure?  no   If yes to any of these questions please route to Joylene John, RN and Erenest Rasher, RN

## 2020-01-31 NOTE — Telephone Encounter (Signed)
NO ANSWER, MESSAGE LEFT FOR PATIENT. 

## 2020-02-01 ENCOUNTER — Encounter: Payer: Self-pay | Admitting: Internal Medicine

## 2020-02-06 ENCOUNTER — Other Ambulatory Visit: Payer: Self-pay

## 2020-02-06 ENCOUNTER — Ambulatory Visit: Payer: PPO

## 2020-02-06 DIAGNOSIS — R5383 Other fatigue: Secondary | ICD-10-CM

## 2020-02-06 DIAGNOSIS — R0609 Other forms of dyspnea: Secondary | ICD-10-CM | POA: Diagnosis not present

## 2020-02-06 DIAGNOSIS — I2510 Atherosclerotic heart disease of native coronary artery without angina pectoris: Secondary | ICD-10-CM | POA: Diagnosis not present

## 2020-02-06 DIAGNOSIS — R5381 Other malaise: Secondary | ICD-10-CM

## 2020-02-06 DIAGNOSIS — R0600 Dyspnea, unspecified: Secondary | ICD-10-CM

## 2020-02-11 ENCOUNTER — Encounter: Payer: Self-pay | Admitting: Neurology

## 2020-02-12 ENCOUNTER — Ambulatory Visit: Payer: PPO | Admitting: Neurology

## 2020-02-12 ENCOUNTER — Other Ambulatory Visit: Payer: Self-pay

## 2020-02-12 ENCOUNTER — Encounter: Payer: Self-pay | Admitting: Neurology

## 2020-02-12 VITALS — BP 111/74 | HR 74 | Temp 97.4°F | Ht 69.0 in | Wt 255.0 lb

## 2020-02-12 DIAGNOSIS — G4733 Obstructive sleep apnea (adult) (pediatric): Secondary | ICD-10-CM | POA: Diagnosis not present

## 2020-02-12 DIAGNOSIS — G4734 Idiopathic sleep related nonobstructive alveolar hypoventilation: Secondary | ICD-10-CM | POA: Diagnosis not present

## 2020-02-12 DIAGNOSIS — G4719 Other hypersomnia: Secondary | ICD-10-CM

## 2020-02-12 DIAGNOSIS — I110 Hypertensive heart disease with heart failure: Secondary | ICD-10-CM | POA: Diagnosis not present

## 2020-02-12 DIAGNOSIS — I2510 Atherosclerotic heart disease of native coronary artery without angina pectoris: Secondary | ICD-10-CM

## 2020-02-12 DIAGNOSIS — N184 Chronic kidney disease, stage 4 (severe): Secondary | ICD-10-CM

## 2020-02-12 NOTE — Progress Notes (Signed)
SLEEP MEDICINE CLINIC    Provider:  Larey Seat, MD  Primary Care Physician:  Marton Redwood, MD [address] [place] [address]     Referring Provider: Dr Einar Gip, MD         Chief Complaint according to patient   Patient presents with:    . New Patient (Initial Visit)     presents today to addres if pressures are set where need to be. DME [employer], machine set up 11/30/16      HISTORY OF PRESENT ILLNESS:  Dalton Mitchell is a 74  Year-old Caucasian male patient seen here upon Dr Irven Shelling referral on 02/12/2020. the patient is well known to [place] and our sleep clinic.  Chief concern according to patient :    I have the pleasure of seeing Dalton Mitchell today, a  right -handed White or Caucasian male with a known sleep disorder.  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   The patient had the first sleep study in January of the year 2018:     Mr. Dalton Mitchell had his last sleep studies results in January 2018 on the eighth after being referred from Dr. Brigitte Pulse his primary care physician he was diagnosed with a rather mild deg ree of apnea with an AHI of 11.6, REM AHI was 32.2 making the use of positive airway pressure necessary.  Also the patient had significant oxygen desaturation during the sleep study he had 164 minutes with an oxygen saturation below 9089%.  He had also a lot of limb movements at night so we asked him to come back for a CPAP titration on 31 January his apnea was significantly reduced but we noticed cardiac arrhythmia, he is still had few PLM's at the beginning of the titration but his limb movements were much alleviated when he reached his final titration of  10 cmH2O with an AHI of 0.0 and his machine was prescribed as an AutoSet with a pressure pressure range between 7 and 12 cmH2O 1 cm expiratory pressure relief.  He has been 83% compliant using the machine 25 out of 30 days with an average on days used of 5 hours 28 minutes.  He reached an AHI residual of 1.8/h which is a good resolution.  There were no central apneas noted his 95th percentile pressure however is increased to 11.4 cmH2O which would correspond with the highest amount of pressure allowed in his pressure window equal to 12 cm water.  He does have a lot of air leakage and I think we have to invest time in finding the best possible sealing mask for him.  I also would be happy to set up his pressure to a maximum of 13 rather with more expiratory pressure relief.  Dr. Einar Gip mentioned that the patient was markedly more fatigued had dyspnea no energy and did not feel overall very well.  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  Current GFR is under 30 mL/min recent laboratories were quoted below,.     Social history: Patient is working as a Development worker, international aid and lives in a household  with his spouse, he has a large family.  Family status is married , with 7 living  children, many grandchildren.  One son committed suicide. The patient currently works part time. Pets are present. 1 cat.  Tobacco use- quit 1970.  ETOH use none , Caffeine intake in form of Coffee (none ) but rarely drinking sodas. Hobbies :gardening    Sleep habits are as follows: The patient's dinner time is between 5-6 PM. The patient goes to bed at 10-11 PM but he often can't sleep-  and continues to sleep for 3-4 hours, he wakes rarely for bathroom breaks, wakes often spontaneously at 3-4  AM.   The preferred sleep position is supine , with the support of 1-2 pillows. Dreams are reportedly   frequent/vivid.  5-6AM is the usual rise time. He reports not feeling refreshed or restored in AM, with symptoms such as dry mouth, but no morning headaches, and always residual fatigue. Naps are taken frequently, lasting from 30-60 minutes and are more refreshing than nocturnal sleep.    Review of Systems: Out of a complete 14 system review, the patient complains of only the following symptoms, and all other reviewed systems are negative.:  Fatigue, sleepiness , snoring, fragmented sleep, Insomnia -    How likely are you to doze in the following situations: 0 = not likely, 1 = slight chance, 2 = moderate chance, 3 = high chance   Sitting and Reading? Watching Television? Sitting inactive in a public place (theater or meeting)? As a passenger in a car for an hour without a break? Lying down in the afternoon when circumstances permit? Sitting and talking to someone? Sitting quietly after lunch without alcohol? In a car, while stopped for a few minutes in traffic?   Total = 8/ 24 points   FSS endorsed at 60/ 63 points. severe , Depression : 9/15   Social History   Socioeconomic History  . Marital status: Married    Spouse name: Not on file  . Number of children: 8  . Years of education: college  . Highest education level: Not on file  Occupational History  . Occupation: Retired   . Occupation: [employer]   Tobacco Use  . Smoking status: Former Smoker    Quit date: 1970    Years since quitting: 51.3  . Smokeless tobacco: Never Used  . Tobacco comment: Quit 1971  Substance and Sexual Activity  . Alcohol use: No  . Drug use: Never  . Sexual activity: Not Currently  Other Topics Concern  . Not on file  Social History Narrative   Drinks about 1-2 caffeine beverages a day    Social Determinants of Health   Financial Resource Strain:   . Difficulty of Paying Living Expenses:   Food Insecurity:   . Worried About Charity fundraiser in the Last Year:   . Arboriculturist in  the Last Year:   Transportation Needs:   . Film/video editor (Medical):   [name] Lack of Transportation (Non-Medical):   Physical Activity:   . Days of Exercise per Week:   . Minutes of Exercise per Session:   Stress:   . Feeling of Stress :   Social Connections:   . Frequency of Communication with Friends and Family:   . Frequency of Social Gatherings with Friends and Family:   . Attends Religious Services:   . Active Member of Clubs or Organizations:   . Attends Archivist Meetings:   .  Marital Status:     Family History  Problem Relation Age of Onset  . Colon cancer Maternal Grandmother   . Colon polyps Maternal Grandmother   . Esophageal cancer Neg Hx   . Rectal cancer Neg Hx   . Stomach cancer Neg Hx     Past Medical History:  Diagnosis Date  . Adenomatous colon polyp   . Allergy    seasonal  . Arthritis   . CAD    Prior inferior MI treated with stent 1995    . 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 Depression    past in early 71s  . GERD 02/24/2008  . Gout   . History of kidney stones   . Hyperlipidemia   . Hypertensive heart disease   . Hypothyroidism   . Obesity (BMI 30-39.9)   . Old inferior wall myocardial infarction   . Sleep apnea    USES CPAP OCCASIONALLY    Past Surgical History:  Procedure Laterality Date  . COLONOSCOPY  12/25/2014   [place] previously  . CORONARY ANGIOPLASTY  1995  . CORONARY ANGIOPLASTY WITH STENT PLACEMENT  1996  . CORONARY ARTERY BYPASS GRAFT N/A 09/17/2013   Procedure: CORONARY ARTERY BYPASS GRAFTING (CABG);  Surgeon: Melrose Nakayama, MD;  Location: [facility];  Service: Open Heart Surgery;  Laterality: N/A;  . CYSTOSCOPY WITH RETROGRADE PYELOGRAM, URETEROSCOPY AND STENT PLACEMENT Right 01/11/2014   Procedure: CYSTOSCOPY WITH RETROGRADE PYELOGRAM, URETEROSCOPY AND STENT PLACEMENT;  Surgeon: Molli Hazard, MD;  Location: [facility];  Service: Urology;  Laterality: Right;  . CYSTOSCOPY WITH  RETROGRADE PYELOGRAM, URETEROSCOPY AND STENT PLACEMENT Right 02/01/2014   Procedure: CYSTOSCOPY WITH RETROGRADE PYELOGRAM, URETEROSCOPY AND STENT EXCHANGE;  Surgeon: Molli Hazard, MD;  Location: [facility];  Service: Urology;  Laterality: Right;  . EXPLORATION POST OPERATIVE OPEN HEART N/A 09/17/2013   Procedure: EXPLORATION POST OPERATIVE OPEN HEART;  Surgeon: Melrose Nakayama, MD;  Location: [facility];  Service: Open Heart Surgery;  Laterality: N/A;  . HOLMIUM LASER APPLICATION Right 12/08/6281   Procedure: HOLMIUM LASER APPLICATION;  Surgeon: Molli Hazard, MD;  Location: [facility];  Service: Urology;  Laterality: Right;  . LEFT HEART CATHETERIZATION WITH CORONARY ANGIOGRAM N/A 09/14/2013   Procedure: LEFT HEART CATHETERIZATION WITH CORONARY ANGIOGRAM;  Surgeon: Jacolyn Reedy, MD;  Location: [facility] C[facility];  Service: Cardiovascular;  Laterality: N/A;  . POLYPECTOMY       Current Outpatient Medications on File Prior to Visit  Medication Sig Dispense Refill  . allopurinol (ZYLOPRIM) 300 MG tablet Take 300 mg by mouth at bedtime.     [name] aspirin EC 325 MG tablet Take 325 mg by mouth at bedtime.    . cetirizine (ZYRTEC ALLERGY) 10 MG tablet Take 10 mg by mouth daily.    . Coenzyme Q10 (COQ-10 PO) Take 100 mg by mouth daily.    [name] ezetimibe (ZETIA) 10 MG tablet Take 10 mg by mouth daily.    . famotidine (PEPCID) 20 MG tablet Take 20 mg by mouth daily as needed.     [name] FLUTICASONE PROPIONATE, NASAL, NA Place 2 Inhalers into the nose daily.    [name] HYDROcodone-acetaminophen (NORCO/VICODIN) 5-325 MG tablet Take 1 tablet by mouth every 6 (six) hours as needed for moderate pain.    [name] levothyroxine (SYNTHROID, LEVOTHROID) 88 MCG tablet Take 88 mcg by mouth daily before breakfast.    . losartan (COZAAR) 50 MG tablet Take 1 tablet by mouth daily.    [name]  metoprolol (LOPRESSOR) 50 MG tablet Take 50 mg by mouth 2 (two) times daily.    . Multiple Vitamins-Minerals (MULTIVITAMIN WITH MINERALS) tablet Take 1  tablet by mouth daily.      [name] OVER THE COUNTER MEDICATION Simply saline nasal spray    . OVER THE COUNTER MEDICATION CBD ointment    . rosuvastatin (CRESTOR) 20 MG tablet Take 20 mg by mouth daily.    [name] senna-docusate (SENOKOT S) 8.6-50 MG per tablet Take 1 tablet by mouth 2 (two) times daily. (Patient taking differently: Take 1 tablet by mouth as needed. ) 60 tablet 0  . sodium chloride (OCEAN) 0.65 % SOLN nasal spray Place 2 sprays into both nostrils at bedtime as needed for congestion.    . TURMERIC PO Take 1,000 mg by mouth daily.    [name] zolpidem (AMBIEN) 10 MG tablet Take 10 mg by mouth at bedtime.     No current facility-administered medications on file prior to visit.    Allergies  Allergen Reactions  . Ace Inhibitors Cough  . Morphine And Related Other (See Comments)    STATES HORRIBLE HALLUCINATIONS.    Physical exam:  Today's Vitals   02/12/20 0841  BP: 111/74  Pulse: 74  Temp: (!) 97.4 F (36.3 C)  Weight: 255 lb (115.7 kg)  Height: 5\' 9"  (1.753 m)   Body mass index is 37.66 kg/m.   Wt Readings from Last 3 Encounters:  02/12/20 255 lb (115.7 kg)  01/29/20 253 lb (114.8 kg)  01/24/20 256 lb (116.1 kg)     Ht Readings from Last 3 Encounters:  02/12/20 5\' 9"  (1.753 m)  01/29/20 5\' 9"  (1.753 m)  01/24/20 5\' 9"  (1.753 m)      General: The patient is awake, alert and appears not in acute distress. The patient is well groomed. Head: Normocephalic, atraumatic. Neck is supple. Mallampati 3 19,  neck circumference: 19 inches . Nasal airflow patent.  Retrognathia is not seen.  Dental status: no dentures.  Cardiovascular:  Regular rate and cardiac rhythm by pulse,  without distended neck veins. Respiratory: Lungs are clear to auscultation, but restricted capacity.  Skin:  With evidence of ankle edema, or rash. Trunk: The patient's posture is erect.   Neurologic exam : The patient is awake and alert, oriented to place and time.   Memory subjective described as  intact.  Attention span & concentration ability appears normal.  Speech is fluent,  without  dysarthria, dysphonia or aphasia.  Mood and affect are appropriate.   Cranial nerves: no loss of smell or taste reported  Pupils are equal and briskly reactive to light. Funduscopic exam deferred. .  Extraocular movements in vertical and horizontal planes were intact and without nystagmus. No Diplopia. Visual fields by finger perimetry are intact. Hearing was intact to soft voice and finger rubbing.    Facial sensation intact to fine touch.  Facial motor strength is symmetric and tongue and uvula move midline.  Neck ROM : rotation, tilt and flexion extension were normal for age and shoulder shrug was symmetrical.    Motor exam:  Symmetric bulk, tone and ROM.   Normal tone without cog wheeling, symmetric grip strength .   Sensory:  Fine touch, pinprick and vibration were normal.  Proprioception tested in the upper extremities was normal.   Coordination: Rapid alternating movements in the fingers/hands were of normal speed.  The Finger-to-nose maneuver was intact without evidence of ataxia, dysmetria or tremor.   Gait and station: Patient could  rise unassisted from a seated position, walked without assistive device.  Stance is of normal width/ base .  Toe and heel walk were deferred.  Deep tendon reflexes: in the  upper and lower extremities are symmetric and intact.  Babinski response was deferred.        After spending a total time of  40 minutes face to face and for physical and neurologic examination, review of laboratory studies,  personal review of imaging studies, reports and results of other testing and review of referral information / records as far as provided in visit, I have established the following assessments:  1) machine works well for his apnea - he may benefit form more pressure.  2) depression, underlying. 3) CKD 3 or 4, will certainly contribute to fatigue.    My Plan  is to proceed with:  1) increase pressure by 2 cm and EPR by 1 cm water, fit a new mask.  20 ONO on CPAP- there may be still hypoxemia.     I would like to thank Marton Redwood, MD and Dr Einar Gip for allowing me to meet with and to take care of this pleasant patient.   In short, LAWTON DOLLINGER is presenting with excessive fatigue , a symptom that can be attributed to many of his chronic conditions, but seem not related to apnea per se.    I plan to follow up either personally or through our NP within 2-3 month. If hypoxemia is present - return for attended PAP study with oxygen.     Electronically signed by: Larey Seat, MD 02/12/2020 9:12 AM  [facility] Board certified by The [employer] of Sleep Medicine and Diplomate of the [employer] of Sleep Medicine. Board certified In Neurology through the [place], Fellow of the [employer] of Neurology. Medical Director of [facility].

## 2020-02-14 DIAGNOSIS — R0902 Hypoxemia: Secondary | ICD-10-CM | POA: Diagnosis not present

## 2020-02-14 DIAGNOSIS — J449 Chronic obstructive pulmonary disease, unspecified: Secondary | ICD-10-CM | POA: Diagnosis not present

## 2020-02-18 ENCOUNTER — Telehealth: Payer: Self-pay | Admitting: Neurology

## 2020-02-18 NOTE — Telephone Encounter (Signed)
Received the patient's ONO report. Will have Dr Dohmeier review and will call the patient with result.

## 2020-02-18 NOTE — Telephone Encounter (Signed)
There were prolonged hypoxic periods noted on ONO- he will need to be titrated in the sleep lab while on PAP therapy or have pulmonologist order oxygen.  I prefer in lab titration.

## 2020-02-20 DIAGNOSIS — G4733 Obstructive sleep apnea (adult) (pediatric): Secondary | ICD-10-CM | POA: Diagnosis not present

## 2020-02-20 NOTE — Telephone Encounter (Signed)
Called the patient to discuss the ONO results. There was no answer and VM box was full. Will attempt to call again.

## 2020-02-21 ENCOUNTER — Other Ambulatory Visit: Payer: Self-pay | Admitting: Neurology

## 2020-02-21 DIAGNOSIS — G4733 Obstructive sleep apnea (adult) (pediatric): Secondary | ICD-10-CM

## 2020-02-21 DIAGNOSIS — G4719 Other hypersomnia: Secondary | ICD-10-CM

## 2020-02-21 DIAGNOSIS — J449 Chronic obstructive pulmonary disease, unspecified: Secondary | ICD-10-CM | POA: Diagnosis not present

## 2020-02-21 DIAGNOSIS — R0902 Hypoxemia: Secondary | ICD-10-CM | POA: Diagnosis not present

## 2020-02-21 NOTE — Telephone Encounter (Signed)
Called the patient and reviewed the overnight oximetry results. Advised there was a significant time spent below 89% and would indicate the need for oxygen level. Advised that the patient would need to come in for a titration study where we would add oxygen at the time of the in lab. Pt verbalized understanding and will look for a call from the sleep lab.

## 2020-02-22 DIAGNOSIS — F418 Other specified anxiety disorders: Secondary | ICD-10-CM | POA: Diagnosis not present

## 2020-02-28 ENCOUNTER — Ambulatory Visit: Payer: PPO | Admitting: Cardiology

## 2020-03-11 NOTE — Progress Notes (Signed)
Primary Physician/Referring:  Marton Redwood, MD  Patient ID: Dalton Mitchell, male    DOB: 1946-02-06, 74 y.o.   MRN: [id number]  Chief Complaint  Patient presents with  . Coronary Artery Disease  . Shortness of Breath  . Fatigue  . Follow-up    4 week   HPI:    KHYLER Mitchell  is a 74 y.o. Caucasian male with coronary artery disease, obesity, hypertension, hyperlipidemia, stage IV chronic kidney disease, history of inferior MI in 1995 SP balloon angioplasty to RCA, history of CABG 1215 2014x4 presents here to discuss fatigue and malaise. He does have sleep apnea and has been compliant with CPAP but was noted to have hypoxemia and is now being evaluated by Dr. Brett Fairy for oxygen supplementation.  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  Lately he does not have any energy and also states that he feels extreme anxiety and depression.    Past Medical History:  Diagnosis Date  . Adenomatous colon polyp   . Allergy    seasonal  . Arthritis   . CAD    Prior inferior MI treated with stent 1995    . 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 Depression    past in early 34s  . GERD 02/24/2008  . Gout   . History of kidney stones   . Hyperlipidemia   . Hypertensive heart disease   . Hypothyroidism   . Obesity (BMI 30-39.9)   . Old inferior wall myocardial infarction   . Sleep apnea    USES CPAP OCCASIONALLY   Past Surgical History:  Procedure Laterality Date  . COLONOSCOPY  12/25/2014   [place] previously  . CORONARY ANGIOPLASTY  1995  . CORONARY ANGIOPLASTY WITH STENT PLACEMENT  1996  . CORONARY ARTERY BYPASS GRAFT N/A 09/17/2013   Procedure: CORONARY ARTERY BYPASS GRAFTING (CABG);  Surgeon: Melrose Nakayama, MD;  Location: [place];  Service: Open Heart Surgery;  Laterality: N/A;  . CYSTOSCOPY WITH RETROGRADE PYELOGRAM, URETEROSCOPY AND STENT PLACEMENT Right 01/11/2014   Procedure: CYSTOSCOPY  WITH RETROGRADE PYELOGRAM, URETEROSCOPY AND STENT PLACEMENT;  Surgeon: Molli Hazard, MD;  Location: [facility];  Service: Urology;  Laterality: Right;  . CYSTOSCOPY WITH RETROGRADE PYELOGRAM, URETEROSCOPY AND STENT PLACEMENT Right 02/01/2014   Procedure: CYSTOSCOPY WITH RETROGRADE PYELOGRAM, URETEROSCOPY AND STENT EXCHANGE;  Surgeon: Molli Hazard, MD;  Location: [facility];  Service: Urology;  Laterality: Right;  . EXPLORATION POST OPERATIVE OPEN HEART N/A 09/17/2013   Procedure: EXPLORATION POST OPERATIVE OPEN HEART;  Surgeon: Melrose Nakayama, MD;  Location: [place];  Service: Open Heart Surgery;  Laterality: N/A;  . HOLMIUM LASER APPLICATION Right 4/0/9811   Procedure: HOLMIUM LASER APPLICATION;  Surgeon: Molli Hazard, MD;  Location: [facility];  Service: Urology;  Laterality: Right;  . LEFT HEART CATHETERIZATION WITH CORONARY ANGIOGRAM N/A 09/14/2013   Procedure: LEFT HEART CATHETERIZATION WITH CORONARY ANGIOGRAM;  Surgeon: Jacolyn Reedy, MD;  Location: [facility];  Service: Cardiovascular;  Laterality: N/A;  . POLYPECTOMY     Family History  Problem Relation Age of Onset  . Colon cancer Maternal Grandmother   . Colon polyps Maternal Grandmother   . Emphysema Mother   . Esophageal cancer Neg Hx   . Rectal cancer Neg Hx   . Stomach cancer Neg Hx     Social History   Tobacco Use  . Smoking status: Former Smoker  Quit date: 1970    Years since quitting: 51.4  . Smokeless tobacco: Never Used  . Tobacco comment: Quit 1971  Substance Use Topics  . Alcohol use: No   Marital Status: Married  ROS  Review of Systems  Constitution: Positive for malaise/fatigue.  Cardiovascular: Negative for dyspnea on exertion and leg swelling.  Respiratory: Positive for snoring (on CPAP and compliant).   Gastrointestinal: Negative for melena.  Psychiatric/Behavioral: Positive for depression. The patient has insomnia and is nervous/anxious.    Objective  Blood pressure 125/85,  pulse 74, resp. rate 16, height 5\' 9"  (1.753 m), weight 252 lb (114.3 kg), SpO2 95 %.  Vitals with BMI 03/12/2020 02/12/2020 01/29/2020  Height 5\' 9"  5\' 9"  -  Weight 252 lbs 255 lbs -  BMI 24.2 68.34 -  Systolic 196 222 979  Diastolic 85 74 86  Pulse 74 74 62     Physical Exam  Constitutional:  He is well-developed, moderately obese in no acute distress.  Cardiovascular: Normal rate, regular rhythm, normal heart sounds and intact distal pulses. Exam reveals no gallop.  No murmur heard. No leg edema, no JVD.  Pulmonary/Chest: Effort normal and breath sounds normal.  Abdominal: Soft. Bowel sounds are normal.   Laboratory examination:   No results for input(s): NA, K, CL, CO2, GLUCOSE, BUN, CREATININE, CALCIUM, GFRNONAA, GFRAA in the last 8760 hours. CrCl cannot be calculated (Patient's most recent lab result is older than the maximum 21 days allowed.).  CMP Latest Ref Rng & Units 02/01/2014 01/25/2014 01/10/2014  Glucose 70 - 99 mg/dL 91 98 106(H)  BUN 6 - 23 mg/dL - 38(H) 35(H)  Creatinine 0.50 - 1.35 mg/dL - 2.51(H) 4.26(H)  Sodium 137 - 147 mEq/L 142 140 140  Potassium 3.7 - 5.3 mEq/L 4.3 5.4(H) 5.3  Chloride 96 - 112 mEq/L - 103 102  CO2 19 - 32 mEq/L - 25 21  Calcium 8.4 - 10.5 mg/dL - 10.5 9.5  Total Protein 6.0 - 8.3 g/dL - - 7.8  Total Bilirubin 0.3 - 1.2 mg/dL - - 0.4  Alkaline Phos 39 - 117 U/L - - 66  AST 0 - 37 U/L - - 17  ALT 0 - 53 U/L - - 12   CBC Latest Ref Rng & Units 02/01/2014 01/25/2014 01/10/2014  WBC 4.0 - 10.5 K/uL - 6.1 8.7  Hemoglobin 13.0 - 17.0 g/dL 14.6 13.1 13.6  Hematocrit 39 - 52 % 43.0 42.0 42.2  Platelets 150 - 400 K/uL - 235 223   Lipid Panel  No results found for: CHOL, TRIG, HDL, CHOLHDL, VLDL, LDLCALC, LDLDIRECT HEMOGLOBIN A1C Lab Results  Component Value Date   HGBA1C 5.8 (H) 09/21/2013   MPG 120 (H) 09/21/2013   TSH No results for input(s): TSH in the last 8760 hours.  External labs: Cholesterol, total 133.000 m 02/22/2019 HDL 36 MG/DL  02/22/2019 LDL 73.000 mg 02/22/2019 Triglycerides 122.000 02/22/2019  A1C 5.800 % 02/22/2019  Hemoglobin 15.000 g/ 02/22/2019  Creatinine, Serum 1.900 mg/ 02/22/2019 Potassium 4.900 mEq 04/19/2017 ALT (SGPT) 21.000 uni 02/22/2019 TSH 1.020 02/22/2019  Medications and allergies   Allergies  Allergen Reactions  . Ace Inhibitors Cough  . Morphine And Related Other (See Comments)    STATES HORRIBLE HALLUCINATIONS.     Current Outpatient Medications  Medication Instructions  . allopurinol (ZYLOPRIM) 300 mg, Daily at bedtime  . aspirin EC 325 mg, Daily at bedtime  . cetirizine (ZYRTEC ALLERGY) 10 mg, Oral, Daily  . Coenzyme Q10 (COQ-10 PO) 100  mg, Oral, Daily  . ezetimibe (ZETIA) 10 mg, Oral, Daily  . famotidine (PEPCID) 20 mg, Oral, Daily PRN  . FLUTICASONE PROPIONATE, NASAL, NA 2 Inhalers, Nasal, Daily  . HYDROcodone-acetaminophen (NORCO/VICODIN) 5-325 MG tablet 1 tablet, Oral, Every 6 hours PRN  . levothyroxine (SYNTHROID) 88 mcg, Oral, Daily before breakfast  . losartan (COZAAR) 50 MG tablet 1 tablet, Oral, Daily  . metoprolol tartrate (LOPRESSOR) 50 mg, Oral, 2 times daily  . Multiple Vitamins-Minerals (MULTIVITAMIN WITH MINERALS) tablet 1 tablet, Daily  . Omega 3 1000 MG CAPS 2,000 capsules, Oral, Daily  . OVER THE COUNTER MEDICATION Simply saline nasal spray   . OVER THE COUNTER MEDICATION CBD ointment   . rosuvastatin (CRESTOR) 20 mg, Oral, Daily  . senna-docusate (SENOKOT S) 8.6-50 MG per tablet 1 tablet, Oral, 2 times daily  . TURMERIC PO 1,000 mg, Oral, Daily  . zolpidem (AMBIEN) 10 mg, Oral, Daily at bedtime   Medications Discontinued During This Encounter  Medication Reason  . sodium chloride (OCEAN) 0.65 % SOLN nasal spray Patient Preference   Radiology:    Cardiac Studies:   Echocardiogram 09/13/2013:   - Left ventricle: The cavity size was normal. Wall thickness was increased in a pattern of mild LVH. Systolic function was normal. The estimated ejection  fraction was in the range of 50% to 55%. Wall motion was normal; there were no regional wall motion abnormalities. Doppler parameters are  consistent with abnormal left ventricular relaxation(grade 1 diastolic dysfunction).  - Left atrium: The atrium was mildly dilated.   Coronary angiogram 09/14/2013: Proximal LAD 80%, OM1 95% stenosis, proximal RCA subtotal stenosis.  CABG x 4 09/17/2013: Utilizing LIMA to LAD, SVG to OM2, and a sequential SVG to PDA and distal RCA.  Sleep Study 10/11/2016: 1. Mild Obstructive Sleep Apnea (OSA), mainly hypopnea. REM AHI was 32.2, making this apnea not suitable for mandibular advancement therapy.  2. Hypoxemia for 164 minutes.  3. 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 4. Snoring   Lexiscan (Walking with mod Bruce)Tetrofosmin Stress Test  02/06/2020: Nondiagnostic ECG stress. Mild degree medium extent perfusion defect consistent with mild (reversible) ischemia located in the mid inferolateral wall and basal inferolateral wall  (Left Circumflex Artery region) of left ventricle. Overall LV systolic function is normal without regional wall motion abnormalities. Stress LV EF: 63%.  No previous exam available for comparison. Low risk.   EKG  11/14/2019: Sinus rhythm with first-degree block at the rate of 69 bpm, left atrial enlargement, cannot exclude inferior infarct old.  Incomplete right bundle branch block.  Cannot exclude posterior infarct old.  No evidence of ischemia.  Normal QT interval.     Assessment     ICD-10-CM   1. Coronary artery disease involving native coronary artery of native heart without angina pectoris  I25.10   2. Obstructive sleep apnea on CPAP  G47.33    Z99.89   3. Malaise and fatigue  R53.81    R53.83     Recommendations:   Dalton Mitchell  is a 74 y.o. Caucasian male with coronary artery disease, obesity, hypertension, hyperlipidemia, stage IV chronic kidney disease, history of inferior MI in  1995 SP balloon angioplasty to RCA, history of CABG 1215 2014x4 presents here to discuss fatigue and malaise. He does have sleep apnea and has been compliant with CPAP but was noted to have hypoxemia and is now being evaluated by Dr. Brett Fairy for oxygen supplementation.  I reviewed the recent perform sepsis, although he has mild  inferolateral ischemia, with preserved LVEF, I am not completely convinced that his fatigue is related to underlying CAD.  He has profound nocturnal hypoxemia, history of obstructive sleep apnea on CPAP, is now being evaluated for oxygen supplementation at night.  Would recommend that he try this route first prior to cardiac catheterization, as I am not completely convinced that the mildly abnormal stress test is etiology for his overall fatigue and malaise.  If after treatment of OSA appropriately, if he continues to have persistent symptoms then we can consider coronary angiography. I discussed this with Dr. Brett Fairy.   Blood pressure is well controlled, external records reviewed, lipids in excellent control as well.  I reassured him to increase his physical activity, weight loss discussed, I will see him back in 6 months for follow-up.   Adrian Prows, MD, [facility] 03/13/2020, 12:31 PM [facility] [place] Pager: [phone] Office: [phone]  CC: Larey Seat, MD

## 2020-03-12 ENCOUNTER — Ambulatory Visit: Payer: PPO | Admitting: Cardiology

## 2020-03-12 ENCOUNTER — Telehealth: Payer: Self-pay

## 2020-03-12 ENCOUNTER — Other Ambulatory Visit: Payer: Self-pay

## 2020-03-12 ENCOUNTER — Encounter: Payer: Self-pay | Admitting: Cardiology

## 2020-03-12 VITALS — BP 125/85 | HR 74 | Resp 16 | Ht 69.0 in | Wt 252.0 lb

## 2020-03-12 DIAGNOSIS — R5383 Other fatigue: Secondary | ICD-10-CM

## 2020-03-12 DIAGNOSIS — G4733 Obstructive sleep apnea (adult) (pediatric): Secondary | ICD-10-CM | POA: Diagnosis not present

## 2020-03-12 DIAGNOSIS — Z9989 Dependence on other enabling machines and devices: Secondary | ICD-10-CM | POA: Diagnosis not present

## 2020-03-12 DIAGNOSIS — R5381 Other malaise: Secondary | ICD-10-CM | POA: Diagnosis not present

## 2020-03-12 DIAGNOSIS — I2510 Atherosclerotic heart disease of native coronary artery without angina pectoris: Secondary | ICD-10-CM

## 2020-03-12 NOTE — Telephone Encounter (Signed)
Patient did not bring in any bottles. Confirmed medications verbally.

## 2020-03-13 ENCOUNTER — Telehealth: Payer: Self-pay | Admitting: Neurology

## 2020-03-13 NOTE — Telephone Encounter (Signed)
I had a brief phone conversation with Dr. Christen Butter who is Mr. Dalton Mitchell cardiologist.  Dr. Einar Gip assured me that Dalton Mitchell's cardiac function is only mildly impaired and he does not think it would be accounting for his degree of fatigue or the degree of hypoxemia that the patient presented with during his sleep study.  We discussed the treatment of his mother mild orthopnea first and then hopefully treatment of hypoxemia may not be necessary, but if hypoxemia is not corrected by positive airway pressure therapy we would go and try to get oxygen supplementation approved for the patient.  I explained that we would need to apply oxygen during a sleep study for documentation of the need of oxygen in spite of therapeutic pressures of positive airway pressure being present.

## 2020-03-18 ENCOUNTER — Ambulatory Visit (INDEPENDENT_AMBULATORY_CARE_PROVIDER_SITE_OTHER): Payer: PPO | Admitting: Neurology

## 2020-03-18 DIAGNOSIS — G4719 Other hypersomnia: Secondary | ICD-10-CM

## 2020-03-18 DIAGNOSIS — G4733 Obstructive sleep apnea (adult) (pediatric): Secondary | ICD-10-CM | POA: Diagnosis not present

## 2020-03-18 DIAGNOSIS — G4734 Idiopathic sleep related nonobstructive alveolar hypoventilation: Secondary | ICD-10-CM

## 2020-03-18 DIAGNOSIS — F418 Other specified anxiety disorders: Secondary | ICD-10-CM | POA: Diagnosis not present

## 2020-03-25 DIAGNOSIS — E7849 Other hyperlipidemia: Secondary | ICD-10-CM | POA: Diagnosis not present

## 2020-03-25 DIAGNOSIS — M109 Gout, unspecified: Secondary | ICD-10-CM | POA: Diagnosis not present

## 2020-03-25 DIAGNOSIS — Z125 Encounter for screening for malignant neoplasm of prostate: Secondary | ICD-10-CM | POA: Diagnosis not present

## 2020-03-25 DIAGNOSIS — R7301 Impaired fasting glucose: Secondary | ICD-10-CM | POA: Diagnosis not present

## 2020-03-25 DIAGNOSIS — E038 Other specified hypothyroidism: Secondary | ICD-10-CM | POA: Diagnosis not present

## 2020-03-28 DIAGNOSIS — G4719 Other hypersomnia: Secondary | ICD-10-CM | POA: Insufficient documentation

## 2020-03-28 DIAGNOSIS — G4733 Obstructive sleep apnea (adult) (pediatric): Secondary | ICD-10-CM | POA: Insufficient documentation

## 2020-03-28 DIAGNOSIS — Z9989 Dependence on other enabling machines and devices: Secondary | ICD-10-CM | POA: Insufficient documentation

## 2020-03-28 NOTE — Addendum Note (Signed)
Addended by: Larey Seat on: 03/28/2020 01:39 PM   Modules accepted: Orders

## 2020-03-28 NOTE — Progress Notes (Signed)
DIAGNOSIS ; Obstructive Sleep Apnea and sleep hypoxia were  alleviated under CPAP of 14 cm water     PLANS/RECOMMENDATIONS:  The patient's current machine can be reset to autotitration mode:  please set to 7 though 16 cm water pressure, 1 cm EPR with heated  humidity under use of a ResMed F20 large sized full face mask.  1. CPAP therapy compliance is defined as 4 hours or more of  nightly use.  2. Any Sleep Apnea patient should avoid sedatives, hypnotics, and  alcohol consumption   DISCUSSION: A follow up appointment will be scheduled in the  Sleep Clinic at [facility].  Please call  [phone] with any questions.

## 2020-03-28 NOTE — Procedures (Signed)
PATIENT'S NAME:  Dalton Mitchell, Dalton Mitchell DOB:      1946-09-21      MR#:    [id number]     DATE OF RECORDING: 03/18/2020 [facility] REFERRING M.D.:  Adrian Prows, MD Study Performed:   Titration to positive airway pressure HISTORY:  Dalton Mitchell is a 74 year-old Caucasian male patient seen here upon Dr. Irven Shelling referral on 02/12/2020. The patient is well known to [place] and our sleep clinic.  Chief concern according to patient:  I have the pleasure of seeing Dalton Mitchell today, a right -handed Caucasian male with a known sleep disorder. He has a medical history of Adenomatous colon polyp, Allergy, Arthritis, CAD, Chronic kidney disease, stage IV,  Depression, GERD (02/24/2008), Gout, History of kidney stones, Hyperlipidemia, Hypertensive heart disease, CABG,  Hypothyroidism, Obesity (BMI 30-39.9), Old inferior wall myocardial infarction, and Sleep apnea. Patient returning for a CPAP Titration sleep study following up on an ONO with prolonged periods of hypoxia while on CPAP, dated 02-18-2020. The patient endorsed the Epworth Sleepiness Scale at 8/24 points. The patient's weight 225 pounds with a height of 69 (inches), resulting in a BMI of 33.3 kg/m2.The patient's neck circumference measured 19 inches.  CURRENT MEDICATIONS: Zyloprim, Aspirin, Zyrtec, COQ-10, Zetia, Pepcid, Norco, Synthroid, Cozaar, Lopressor, Multivitamins, Crestor, Senokot, Ocean, Ambien.   PROCEDURE:  This is a multichannel digital polysomnogram utilizing the SomnoStar 11.2 system.  Electrodes and sensors were applied and monitored per AASM Specifications.   EEG, EOG, Chin and Limb EMG, were sampled at 200 Hz.  ECG, Snore and Nasal Pressure, Thermal Airflow, Respiratory Effort, CPAP Flow and Pressure, Oximetry was sampled at 50 Hz. Digital video and audio were recorded.       The patient was fitted with a ResMed F20 large sized full face mask. CPAP was initiated at 5 cmH20 with heated humidity per AASM split night standards and pressure was  advanced to 10cmH20 because of hypopneas, apneas and desaturations.  At a PAP pressure of 14 cmH20, there was a reduction of the AHI to 0 with improvement of sleep apnea. There was no hypoxemia noted after CPAP pressures of 11 and higher were reached, nadir at last setting was 93%.   Lights Out was at 22:05 and Lights On at 05:07. Total recording time (TRT) was 423 minutes, with a total sleep time (TST) of 363.5 minutes. The patient's sleep latency was 33.5 minutes. REM latency was 76.5 minutes.  The sleep efficiency was 85.9 %.    SLEEP ARCHITECTURE: WASO (Wake after sleep onset) was 21.5 minutes.  There were 8 minutes in Stage N1, 254.5 minutes Stage N2, 27.5 minutes Stage N3 and 73.5 minutes in Stage REM.  The percentage of Stage N1 was 2.2%, Stage N2 was 70.%, Stage N3 was 7.6% and Stage R (REM sleep) was 20.2%.   RESPIRATORY ANALYSIS:  There was a total of 38 respiratory events: 38 hypopneas with a hypopnea index of 6.3/hour.  The total APNEA/HYPOPNEA INDEX  (AHI) was 6.3 /hour .  7 events occurred in REM sleep and 31 events in NREM. The REM AHI was 5.7 /hour versus a non-REM AHI of 6.4 /hour.  The patient spent 91.5 minutes of total sleep time in the supine position and 272 minutes in non-supine. The supine AHI was 9.2, versus a non-supine AHI of 5.3/h.  OXYGEN SATURATION & C02: The baseline 02 saturation was 92%, with the lowest being 85%. Time spent below 89% saturation equaled 16 minutes.  The arousals were noted as: 16  were spontaneous, 14 were associated with PLMs, 4 were associated with respiratory events. The patient had a total of 189 Periodic Limb Movements. The Periodic Limb Movement (PLM) index was 31.2 and the PLM Arousal index was 2.3 /hour.   Audio and video analysis did not show any abnormal or unusual movements, behaviors, phonations or vocalizations.   EKG was in keeping with regular rhythm.  DIAGNOSIS ; Obstructive Sleep Apnea and sleep hypoxia were alleviated under CPAP of  14 cm water     PLANS/RECOMMENDATIONS:  The patient's current machine can be reset to autotitration mode: please set to 7 though 16 cm water pressure, 1 cm EPR with heated humidity under use of a ResMed F20 large sized full face mask.  1. CPAP therapy compliance is defined as 4 hours or more of nightly use. 2. Any Sleep Apnea patient should avoid sedatives, hypnotics, and alcohol consumption   DISCUSSION: A follow up appointment will be scheduled in the Sleep Clinic at [facility].   Please call [phone] with any questions.      I certify that I have reviewed the entire raw data recording prior to the issuance of this report in accordance with the Standards of Accreditation of the American Academy of Sleep Medicine (AASM)  Larey Seat, M.D. Diplomat, Tax adviser of Psychiatry and Neurology  Diplomat, Tax adviser of Sleep Medicine Market researcher, [employer] [facility] at [employer]

## 2020-03-31 ENCOUNTER — Telehealth: Payer: Self-pay | Admitting: Neurology

## 2020-03-31 DIAGNOSIS — M109 Gout, unspecified: Secondary | ICD-10-CM | POA: Diagnosis not present

## 2020-03-31 DIAGNOSIS — I25810 Atherosclerosis of coronary artery bypass graft(s) without angina pectoris: Secondary | ICD-10-CM | POA: Diagnosis not present

## 2020-03-31 DIAGNOSIS — N1832 Chronic kidney disease, stage 3b: Secondary | ICD-10-CM | POA: Diagnosis not present

## 2020-03-31 DIAGNOSIS — F339 Major depressive disorder, recurrent, unspecified: Secondary | ICD-10-CM | POA: Diagnosis not present

## 2020-03-31 DIAGNOSIS — K529 Noninfective gastroenteritis and colitis, unspecified: Secondary | ICD-10-CM | POA: Diagnosis not present

## 2020-03-31 DIAGNOSIS — Z7689 Persons encountering health services in other specified circumstances: Secondary | ICD-10-CM | POA: Diagnosis not present

## 2020-03-31 DIAGNOSIS — R5383 Other fatigue: Secondary | ICD-10-CM | POA: Diagnosis not present

## 2020-03-31 DIAGNOSIS — E039 Hypothyroidism, unspecified: Secondary | ICD-10-CM | POA: Diagnosis not present

## 2020-03-31 DIAGNOSIS — I10 Essential (primary) hypertension: Secondary | ICD-10-CM | POA: Diagnosis not present

## 2020-03-31 DIAGNOSIS — R7301 Impaired fasting glucose: Secondary | ICD-10-CM | POA: Diagnosis not present

## 2020-03-31 DIAGNOSIS — R82998 Other abnormal findings in urine: Secondary | ICD-10-CM | POA: Diagnosis not present

## 2020-03-31 DIAGNOSIS — R05 Cough: Secondary | ICD-10-CM | POA: Diagnosis not present

## 2020-03-31 DIAGNOSIS — E785 Hyperlipidemia, unspecified: Secondary | ICD-10-CM | POA: Diagnosis not present

## 2020-03-31 DIAGNOSIS — Z0000 Encounter for general adult medical examination without abnormal findings: Secondary | ICD-10-CM | POA: Diagnosis not present

## 2020-03-31 NOTE — Telephone Encounter (Signed)
Called patient to discuss sleep study results. No answer at this time. Left a detailed message advising that the pt did not qualify per the SS for oxygen needs. Pressure will be changed for the pt. LVM for the patient to call back to discuss further.

## 2020-03-31 NOTE — Telephone Encounter (Signed)
-----   Message from Larey Seat, MD sent at 03/28/2020  1:39 PM EDT ----- DIAGNOSIS ; Obstructive Sleep Apnea and sleep hypoxia were  alleviated under CPAP of 14 cm water     PLANS/RECOMMENDATIONS:  The patient's current machine can be reset to autotitration mode:  please set to 7 though 16 cm water pressure, 1 cm EPR with heated  humidity under use of a ResMed F20 large sized full face mask.  1. CPAP therapy compliance is defined as 4 hours or more of  nightly use.  2. Any Sleep Apnea patient should avoid sedatives, hypnotics, and  alcohol consumption   DISCUSSION: A follow up appointment will be scheduled in the  Sleep Clinic at [facility].  Please call  [phone] with any questions.

## 2020-04-03 NOTE — Telephone Encounter (Signed)
Called the pt and reviewed the sleep study results. This study showed that the patient was best treated on CPAP at a pressure of 14 cm water pressure. His oxygen level was also treated well at that pressure and would not qualify for the need of oxygen. She recommends increasing the maximum pressure. I will send the updated order to [employer] for the pt. Pt verbalized understanding.

## 2020-04-03 NOTE — Telephone Encounter (Signed)
-----   Message from Larey Seat, MD sent at 03/28/2020  1:39 PM EDT ----- DIAGNOSIS ; Obstructive Sleep Apnea and sleep hypoxia were  alleviated under CPAP of 14 cm water     PLANS/RECOMMENDATIONS:  The patient's current machine can be reset to autotitration mode:  please set to 7 though 16 cm water pressure, 1 cm EPR with heated  humidity under use of a ResMed F20 large sized full face mask.  1. CPAP therapy compliance is defined as 4 hours or more of  nightly use.  2. Any Sleep Apnea patient should avoid sedatives, hypnotics, and  alcohol consumption   DISCUSSION: A follow up appointment will be scheduled in the  Sleep Clinic at [facility].  Please call  [phone] with any questions.

## 2020-05-05 DIAGNOSIS — I2510 Atherosclerotic heart disease of native coronary artery without angina pectoris: Secondary | ICD-10-CM | POA: Diagnosis not present

## 2020-05-05 DIAGNOSIS — M1000 Idiopathic gout, unspecified site: Secondary | ICD-10-CM | POA: Diagnosis not present

## 2020-05-05 DIAGNOSIS — N1832 Chronic kidney disease, stage 3b: Secondary | ICD-10-CM | POA: Diagnosis not present

## 2020-05-05 DIAGNOSIS — N189 Chronic kidney disease, unspecified: Secondary | ICD-10-CM | POA: Diagnosis not present

## 2020-05-05 DIAGNOSIS — E039 Hypothyroidism, unspecified: Secondary | ICD-10-CM | POA: Diagnosis not present

## 2020-05-05 DIAGNOSIS — N2581 Secondary hyperparathyroidism of renal origin: Secondary | ICD-10-CM | POA: Diagnosis not present

## 2020-05-12 ENCOUNTER — Other Ambulatory Visit: Payer: Self-pay | Admitting: Nephrology

## 2020-05-12 DIAGNOSIS — N1832 Chronic kidney disease, stage 3b: Secondary | ICD-10-CM

## 2020-05-14 ENCOUNTER — Ambulatory Visit: Payer: PPO | Admitting: Cardiology

## 2020-05-14 ENCOUNTER — Ambulatory Visit: Payer: PPO | Admitting: Neurology

## 2020-05-20 ENCOUNTER — Ambulatory Visit
Admission: RE | Admit: 2020-05-20 | Discharge: 2020-05-20 | Disposition: A | Discharge status: Home / Self Care | Payer: PPO | Source: Ambulatory Visit | Attending: Nephrology | Admitting: Nephrology

## 2020-05-20 ENCOUNTER — Other Ambulatory Visit: Payer: Self-pay

## 2020-05-20 DIAGNOSIS — N1832 Chronic kidney disease, stage 3b: Secondary | ICD-10-CM

## 2020-05-20 DIAGNOSIS — N281 Cyst of kidney, acquired: Secondary | ICD-10-CM | POA: Diagnosis not present

## 2020-05-20 DIAGNOSIS — N183 Chronic kidney disease, stage 3 unspecified: Secondary | ICD-10-CM | POA: Diagnosis not present

## 2020-05-21 DIAGNOSIS — E039 Hypothyroidism, unspecified: Secondary | ICD-10-CM | POA: Diagnosis not present

## 2020-05-21 DIAGNOSIS — G4733 Obstructive sleep apnea (adult) (pediatric): Secondary | ICD-10-CM | POA: Diagnosis not present

## 2020-05-21 DIAGNOSIS — E291 Testicular hypofunction: Secondary | ICD-10-CM | POA: Diagnosis not present

## 2020-05-21 DIAGNOSIS — N184 Chronic kidney disease, stage 4 (severe): Secondary | ICD-10-CM | POA: Diagnosis not present

## 2020-05-21 DIAGNOSIS — I25810 Atherosclerosis of coronary artery bypass graft(s) without angina pectoris: Secondary | ICD-10-CM | POA: Diagnosis not present

## 2020-05-21 DIAGNOSIS — E785 Hyperlipidemia, unspecified: Secondary | ICD-10-CM | POA: Diagnosis not present

## 2020-05-21 DIAGNOSIS — F339 Major depressive disorder, recurrent, unspecified: Secondary | ICD-10-CM | POA: Diagnosis not present

## 2020-05-21 DIAGNOSIS — F1320 Sedative, hypnotic or anxiolytic dependence, uncomplicated: Secondary | ICD-10-CM | POA: Diagnosis not present

## 2020-05-21 DIAGNOSIS — I10 Essential (primary) hypertension: Secondary | ICD-10-CM | POA: Diagnosis not present

## 2020-05-21 DIAGNOSIS — R7301 Impaired fasting glucose: Secondary | ICD-10-CM | POA: Diagnosis not present

## 2020-05-21 DIAGNOSIS — I129 Hypertensive chronic kidney disease with stage 1 through stage 4 chronic kidney disease, or unspecified chronic kidney disease: Secondary | ICD-10-CM | POA: Diagnosis not present

## 2020-05-26 DIAGNOSIS — N183 Chronic kidney disease, stage 3 unspecified: Secondary | ICD-10-CM | POA: Diagnosis not present

## 2020-05-26 DIAGNOSIS — N281 Cyst of kidney, acquired: Secondary | ICD-10-CM | POA: Diagnosis not present

## 2020-05-26 DIAGNOSIS — N5201 Erectile dysfunction due to arterial insufficiency: Secondary | ICD-10-CM | POA: Diagnosis not present

## 2020-05-26 DIAGNOSIS — D4101 Neoplasm of uncertain behavior of right kidney: Secondary | ICD-10-CM | POA: Diagnosis not present

## 2020-06-10 ENCOUNTER — Other Ambulatory Visit: Payer: Self-pay

## 2020-06-10 ENCOUNTER — Ambulatory Visit: Payer: PPO | Admitting: Neurology

## 2020-06-10 ENCOUNTER — Encounter: Payer: Self-pay | Admitting: Neurology

## 2020-06-10 VITALS — BP 108/68 | HR 72 | Ht 69.0 in | Wt 248.0 lb

## 2020-06-10 DIAGNOSIS — Z9989 Dependence on other enabling machines and devices: Secondary | ICD-10-CM | POA: Diagnosis not present

## 2020-06-10 DIAGNOSIS — G4733 Obstructive sleep apnea (adult) (pediatric): Secondary | ICD-10-CM | POA: Diagnosis not present

## 2020-06-10 MED ORDER — ZOLPIDEM TARTRATE 10 MG PO TABS: 10.0000 mg | ORAL_TABLET | Freq: Every day | ORAL | 1 refills | Status: DC

## 2020-06-10 NOTE — Patient Instructions (Signed)
Quality Sleep Information, Adult Quality sleep is important for your mental and physical health. It also improves your quality of life. Quality sleep means you:  Are asleep for most of the time you are in bed.  Fall asleep within 30 minutes.  Wake up no more than once a night.  Are awake for no longer than 20 minutes if you do wake up during the night. Most adults need 7-8 hours of quality sleep each night. How can poor sleep affect me? If you do not get enough quality sleep, you may have:  Mood swings.  Daytime sleepiness.  Confusion.  Decreased reaction time.  Sleep disorders, such as insomnia and sleep apnea.  Difficulty with: ? Solving problems. ? Coping with stress. ? Paying attention. These issues may affect your performance and productivity at work, school, and at home. Lack of sleep may also put you at higher risk for accidents, suicide, and risky behaviors. If you do not get quality sleep you may also be at higher risk for several health problems, including:  Infections.  Type 2 diabetes.  Heart disease.  High blood pressure.  Obesity.  Worsening of long-term conditions, like arthritis, kidney disease, depression, Parkinson's disease, and epilepsy. What actions can I take to get more quality sleep?      Stick to a sleep schedule. Go to sleep and wake up at about the same time each day. Do not try to sleep less on weekdays and make up for lost sleep on weekends. This does not work.  Try to get about 30 minutes of exercise on most days. Do not exercise 2-3 hours before going to bed.  Limit naps during the day to 30 minutes or less.  Do not use any products that contain nicotine or tobacco, such as cigarettes or e-cigarettes. If you need help quitting, ask your health care provider.  Do not drink caffeinated beverages for at least 8 hours before going to bed. Coffee, tea, and some sodas contain caffeine.  Do not drink alcohol close to bedtime.  Do not  eat large meals close to bedtime.  Do not take naps in the late afternoon.  Try to get at least 30 minutes of sunlight every day. Morning sunlight is best.  Make time to relax before bed. Reading, listening to music, or taking a hot bath promotes quality sleep.  Make your bedroom a place that promotes quality sleep. Keep your bedroom dark, quiet, and at a comfortable room temperature. Make sure your bed is comfortable. Take out sleep distractions like TV, a computer, smartphone, and bright lights.  If you are lying awake in bed for longer than 20 minutes, get up and do a relaxing activity until you feel sleepy.  Work with your health care provider to treat medical conditions that may affect sleeping, such as: ? Nasal obstruction. ? Snoring. ? Sleep apnea and other sleep disorders.  Talk to your health care provider if you think any of your prescription medicines may cause you to have difficulty falling or staying asleep.  If you have sleep problems, talk with a sleep consultant. If you think you have a sleep disorder, talk with your health care provider about getting evaluated by a specialist. Where to find more information  National Sleep Foundation website: https://sleepfoundation.org  National Heart, Lung, and Blood Institute (NHLBI): www.nhlbi.nih.gov/files/docs/public/sleep/healthy_sleep.pdf  Centers for Disease Control and Prevention (CDC): www.cdc.gov/sleep/index.html Contact a health care provider if you:  Have trouble getting to sleep or staying asleep.  Often wake up   very early in the morning and cannot get back to sleep.  Have daytime sleepiness.  Have daytime sleep attacks of suddenly falling asleep and sudden muscle weakness (narcolepsy).  Have a tingling sensation in your legs with a strong urge to move your legs (restless legs syndrome).  Stop breathing briefly during sleep (sleep apnea).  Think you have a sleep disorder or are taking a medicine that is  affecting your quality of sleep. Summary  Most adults need 7-8 hours of quality sleep each night.  Getting enough quality sleep is an important part of health and well-being.  Make your bedroom a place that promotes quality sleep and avoid things that may cause you to have poor sleep, such as alcohol, caffeine, smoking, and large meals.  Talk to your health care provider if you have trouble falling asleep or staying asleep. This information is not intended to replace advice given to you by your health care provider. Make sure you discuss any questions you have with your health care provider. Document Revised: 12/28/2017 Document Reviewed: 12/28/2017 Elsevier Patient Education  [place]. CPAP and BPAP Information CPAP and BPAP are methods of helping a person breathe with the use of air pressure. CPAP stands for "continuous positive airway pressure." BPAP stands for "bi-level positive airway pressure." In both methods, air is blown through your nose or mouth and into your air passages to help you breathe well. CPAP and BPAP use different amounts of pressure to blow air. With CPAP, the amount of pressure stays the same while you breathe in and out. With BPAP, the amount of pressure is increased when you breathe in (inhale) so that you can take larger breaths. Your health care provider will recommend whether CPAP or BPAP would be more helpful for you. Why are CPAP and BPAP treatments used? CPAP or BPAP can be helpful if you have:  Sleep apnea.  Chronic obstructive pulmonary disease (COPD).  Heart failure.  Medical conditions that weaken the muscles of the chest including muscular dystrophy, or neurological diseases such as amyotrophic lateral sclerosis (ALS).  Other problems that cause breathing to be weak, abnormal, or difficult. CPAP is most commonly used for obstructive sleep apnea (OSA) to keep the airways from collapsing when the muscles relax during sleep. How is CPAP or BPAP  administered? Both CPAP and BPAP are provided by a small machine with a flexible plastic tube that attaches to a plastic mask. You wear the mask. Air is blown through the mask into your nose or mouth. The amount of pressure that is used to blow the air can be adjusted on the machine. Your health care provider will determine the pressure setting that should be used based on your individual needs. When should CPAP or BPAP be used? In most cases, the mask only needs to be worn during sleep. Generally, the mask needs to be worn throughout the night and during any daytime naps. People with certain medical conditions may also need to wear the mask at other times when they are awake. Follow instructions from your health care provider about when to use the machine. What are some tips for using the mask?   Because the mask needs to be snug, some people feel trapped or closed-in (claustrophobic) when first using the mask. If you feel this way, you may need to get used to the mask. One way to do this is by holding the mask loosely over your nose or mouth and then gradually applying the mask more snugly.  You can also gradually increase the amount of time that you use the mask.  Masks are available in various types and sizes. Some fit over your mouth and nose while others fit over just your nose. If your mask does not fit well, talk with your health care provider about getting a different one.  If you are using a mask that fits over your nose and you tend to breathe through your mouth, a chin strap may be applied to help keep your mouth closed.  The CPAP and BPAP machines have alarms that may sound if the mask comes off or develops a leak.  If you have trouble with the mask, it is very important that you talk with your health care provider about finding a way to make the mask easier to tolerate. Do not stop using the mask. Stopping the use of the mask could have a negative impact on your health. What are some  tips for using the machine?  Place your CPAP or BPAP machine on a secure table or stand near an electrical outlet.  Know where the on/off switch is located on the machine.  Follow instructions from your health care provider about how to set the pressure on your machine and when you should use it.  Do not eat or drink while the CPAP or BPAP machine is on. Food or fluids could get pushed into your lungs by the pressure of the CPAP or BPAP.  Do not smoke. Tobacco smoke residue can damage the machine.  For home use, CPAP and BPAP machines can be rented or purchased through home health care companies. Many different brands of machines are available. Renting a machine before purchasing may help you find out which particular machine works well for you.  Keep the CPAP or BPAP machine and attachments clean. Ask your health care provider for specific instructions. Get help right away if:  You have redness or open areas around your nose or mouth where the mask fits.  You have trouble using the CPAP or BPAP machine.  You cannot tolerate wearing the CPAP or BPAP mask.  You have pain, discomfort, and bloating in your abdomen. Summary  CPAP and BPAP are methods of helping a person breathe with the use of air pressure.  Both CPAP and BPAP are provided by a small machine with a flexible plastic tube that attaches to a plastic mask.  If you have trouble with the mask, it is very important that you talk with your health care provider about finding a way to make the mask easier to tolerate. This information is not intended to replace advice given to you by your health care provider. Make sure you discuss any questions you have with your health care provider. Document Revised: 01/10/2019 Document Reviewed: 08/09/2016 Elsevier Patient Education  [place].

## 2020-06-10 NOTE — Progress Notes (Signed)
SLEEP MEDICINE CLINIC    Provider:  Larey Seat, MD  Primary Care Physician:  Marton Redwood, MD [address] [place] [address]     Referring Provider: Dr Einar Gip, MD         Chief Complaint according to patient   Patient presents with:    . New Patient (Initial Visit)     presents today to addres if pressures are set where need to be. DME [employer], machine set up 11/30/16      HISTORY OF PRESENT ILLNESS:  Dalton Mitchell is a 74  Year-old Caucasian male patient seen here on 06/10/2020. the patient is well known to [place] and our sleep clinic.   Chief concern according to patient : follow up on sleep studies-   I am seeing [name] today, a  right -handed White or Caucasian male with a known sleep disorder.  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  Mr. Stansbury is seen here on 10 June 2020 to follow-up on his most recent sleep study which took place on 15 June the patient's current CPAP machine could be set to auto titration mode    I have the pressure between 7 and 16 cmH2O and 1 cm EPR under the use of a ResMed F 20 large size fullface mask.  He did respond positively to 14 cmH2O pressure his previous baseline sleep study followed in over no was prolonged.  Of hypoxia while on CPAP.  AHI resolved to 0.0/h under 14 cmH2O pressure sleep efficiency was 86%, and he is now meeting to see if he could adhere to this regimen.  He has used the machine 24 out of 30 days, average use at time is 4 hours 55 minutes, 69% he is using the machine over 4 hours but there were 4 days where he was under the 4-hour mark.  His residual AHI is excellent 2.2/h which is a significant  reduction to baseline he does not have a significant central apnea problem with in his upper residual apneas, and his 95th percentile pressure was 13 cmH2O 95th percentile air leakage was at 33 L/min and this may be further reducible. Currently using an Airfit F10 with magnetic snaps.    The patient had the first sleep study in January of the year 2018: upon Dr Irven Shelling referral .    Mr. Romer had his last sleep studies results in January 2018 on the eighth after being referred from Dr. Brigitte Pulse his primary care physician he was diagnosed with a rather mild degree of apnea with an AHI of 11.6, REM AHI was 32.2 making the use of positive airway pressure necessary.  Also the patient had significant oxygen desaturation during the sleep study he had 164 minutes with an oxygen saturation below 9089%.  He had also a lot of limb movements at night so we asked him to come back for a CPAP titration on 31 January his apnea was significantly reduced but we noticed cardiac arrhythmia, he is still had few PLM's at the beginning of the titration but his limb movements were much alleviated when he reached his final titration of 10 cmH2O with an AHI of 0.0 and his machine was prescribed as an AutoSet with a pressure pressure range  between 7 and 12 cmH2O 1 cm expiratory pressure relief.  He has been 83% compliant using the machine 25 out of 30 days with an average on days used of 5 hours 28 minutes.  He reached an AHI residual of 1.8/h which is a good resolution.  There were no central apneas noted his 95th percentile pressure however is increased to 11.4 cmH2O which would correspond with the highest amount of pressure allowed in his pressure window equal to 12 cm water.  He does have a lot of air leakage and I think we have to invest time in finding the best possible sealing mask for him.  I also would be happy to set up his pressure to a maximum of 13 rather with more expiratory pressure relief.  Dr. Einar Gip mentioned that the  patient was markedly more fatigued had dyspnea no energy and did not feel overall very well.  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  Current GFR is under 30 mL/min recent laboratories were quoted below,.     Social history: Patient is working as a Development worker, international [employer] and lives in a household with his spouse, he has a large family.  Family status is married , with 7 living  children, many grandchildren.  One son committed suicide. 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 . Pets are present. 1 cat.  Tobacco use- quit 1970.  ETOH use none , Caffeine intake in form of Coffee (none ) but rarely drinking sodas. Hobbies :gardening    Sleep habits are as follows: The patient's dinner time is between 5-6 PM. The patient goes to bed at 10-11 PM but he often can't sleep-  and continues to sleep for 3-4 hours, he wakes rarely for bathroom breaks, wakes often spontaneously at 3-4  AM.   The preferred sleep position is supine , with the support of 1-2 pillows. Dreams are reportedly  frequent/vivid.  5-6AM is the usual rise time. He reports not feeling refreshed or restored in AM, with symptoms such as dry mouth, but no morning headaches, and always residual fatigue. Naps are taken frequently, lasting from 30-60 minutes and are more refreshing than nocturnal sleep.    Review of Systems: Out of a complete 14 system review, the patient complains of only the following symptoms, and all other reviewed systems are negative.:  Fatigue, sleepiness , snoring, fragmented sleep, Insomnia -    How likely are you to doze in the following situations: 0 = not likely, 1 = slight chance, 2 = moderate chance, 3 = high chance   Sitting and Reading? Watching Television? Sitting inactive in a public place (theater or meeting)? As a passenger in a car for an hour  without a break? Lying down in the afternoon when circumstances permit? Sitting and talking to someone? Sitting quietly after lunch without alcohol? In a car, while stopped for a few minutes in traffic?   Total = 8/ 24 points   FSS endorsed at 60/ 63 points. severe , Depression : 9/15   Social History   Socioeconomic History  . Marital status: Married    Spouse name: Not on file  . Number of children: 8  . Years of education: college  . Highest education level: Not on file  Occupational History  . Occupation: Retired   . Occupation: [employer]  Tobacco Use  . Smoking status: Former Smoker    Quit date: 1970    Years since quitting: 51.7  . Smokeless tobacco: Never Used  . Tobacco comment: Quit 1971  Vaping Use  . Vaping Use: Never used  Substance and Sexual Activity  . Alcohol use: No  . Drug use: Never  . Sexual activity: Not Currently  Other Topics Concern  . Not on file  Social History Narrative   Drinks about 1-2 caffeine beverages a day    Social Determinants of Health   Financial Resource Strain:   . Difficulty of Paying Living Expenses: Not on file  Food Insecurity:   . Worried About Charity fundraiser in the Last Year: Not on file  . Ran Out of Food in the Last Year: Not on file  Transportation Needs:   . Lack of Transportation (Medical): Not on file  . Lack of Transportation (Non-Medical): Not on file  Physical Activity:   . Days of Exercise per Week: Not on file  . Minutes of Exercise per Session: Not on file  Stress:   . Feeling of Stress : Not on file  Social Connections:   . Frequency of Communication with Friends and Family: Not on file  . Frequency of Social Gatherings with Friends and Family: Not on file  . Attends Religious Services: Not on file  . Active Member of Clubs or Organizations: Not on file  . Attends Archivist Meetings: Not on file  . Marital Status: Not on file    Family History  Problem Relation Age of Onset  .  Colon cancer Maternal Grandmother   . Colon polyps Maternal Grandmother   . Emphysema Mother   . Esophageal cancer Neg Hx   . Rectal cancer Neg Hx   . Stomach cancer Neg Hx     Past Medical History:  Diagnosis Date  . Adenomatous colon polyp   . Allergy    seasonal  . Arthritis   . CAD    Prior inferior MI treated with stent 1995    . 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 Depression    past in early 73s  . GERD 02/24/2008  . Gout   . History of kidney stones   . Hyperlipidemia   . Hypertensive heart disease   . Hypothyroidism   . Obesity (BMI 30-39.9)   . Old inferior wall myocardial infarction   . Sleep apnea    USES CPAP OCCASIONALLY    Past Surgical History:  Procedure Laterality Date  . COLONOSCOPY  12/25/2014   [place] previously  . CORONARY ANGIOPLASTY  1995  . CORONARY ANGIOPLASTY WITH STENT PLACEMENT  1996  . CORONARY ARTERY BYPASS GRAFT N/A 09/17/2013   Procedure: CORONARY ARTERY BYPASS GRAFTING (CABG);  Surgeon: Melrose Nakayama, MD;  Location: [facility];  Service: Open Heart Surgery;  Laterality: N/A;  . CYSTOSCOPY WITH RETROGRADE PYELOGRAM, URETEROSCOPY AND STENT PLACEMENT Right 01/11/2014   Procedure: CYSTOSCOPY WITH RETROGRADE PYELOGRAM, URETEROSCOPY AND STENT PLACEMENT;  Surgeon: Molli Hazard, MD;  Location: [facility];  Service: Urology;  Laterality: Right;  . CYSTOSCOPY WITH RETROGRADE PYELOGRAM, URETEROSCOPY AND STENT PLACEMENT Right 02/01/2014   Procedure: CYSTOSCOPY WITH RETROGRADE PYELOGRAM, URETEROSCOPY AND STENT EXCHANGE;  Surgeon: Molli Hazard, MD;  Location: [facility];  Service: Urology;  Laterality: Right;  . EXPLORATION POST OPERATIVE OPEN HEART N/A 09/17/2013   Procedure: EXPLORATION POST OPERATIVE OPEN HEART;  Surgeon: Melrose Nakayama, MD;  Location: [facility];  Service: Open Heart Surgery;  Laterality: N/A;  . HOLMIUM LASER APPLICATION Right 01/04/3535   Procedure: HOLMIUM LASER APPLICATION;  Surgeon: Molli Hazard, MD;  Location: [facility];  Service: Urology;  Laterality: Right;  . LEFT HEART CATHETERIZATION WITH CORONARY ANGIOGRAM N/A 09/14/2013   Procedure: LEFT HEART CATHETERIZATION WITH CORONARY ANGIOGRAM;  Surgeon: Jacolyn Reedy, MD;  Location[facility];  Service: Cardiovascular;  Laterality: N/A;  . POLYPECTOMY       Current Outpatient Medications on File Prior to Visit  Medication Sig Dispense Refill  . allopurinol (ZYLOPRIM) 300 MG tablet Take 300 mg by mouth at bedtime.     [name] aspirin EC 325 MG tablet Take 325 mg by mouth at bedtime.    . cetirizine (ZYRTEC ALLERGY) 10 MG tablet Take 10 mg by mouth daily.    . Coenzyme Q10 (COQ-10 PO) Take 100 mg by mouth daily.    [name] ezetimibe (ZETIA) 10 MG tablet Take 10 mg by mouth daily.    . famotidine (PEPCID) 20 MG tablet Take 20 mg by mouth daily as needed.     [name] FLUTICASONE PROPIONATE, NASAL, NA Place 2 Inhalers into the nose daily.    [name] HYDROcodone-acetaminophen (NORCO/VICODIN) 5-325 MG tablet Take 1 tablet by mouth every 6 (six) hours as needed for moderate pain.    [name] levothyroxine (SYNTHROID, LEVOTHROID) 88 MCG tablet Take 88 mcg by mouth daily before breakfast.    . losartan (COZAAR) 50 MG tablet Take 1 tablet by mouth daily.    . metoprolol (LOPRESSOR) 50 MG tablet Take 50 mg by mouth 2 (two) times daily.    . Multiple Vitamins-Minerals (MULTIVITAMIN WITH MINERALS) tablet Take 1 tablet by mouth daily.      . Omega 3 1000 MG CAPS Take 2,000 capsules by mouth daily.    [name] OVER THE COUNTER MEDICATION Simply saline nasal spray    . OVER THE COUNTER MEDICATION CBD ointment    . rosuvastatin (CRESTOR) 20 MG tablet Take 20 mg by mouth daily.    [name] senna-docusate (SENOKOT S) 8.6-50 MG per tablet Take 1 tablet by mouth 2 (two) times daily. (Patient taking differently: Take 1 tablet by mouth as needed. ) 60 tablet 0  . TURMERIC PO Take 1,000 mg by mouth daily.    [name] zolpidem (AMBIEN) 10 MG tablet Take 10 mg by mouth at bedtime.     No current  facility-administered medications on file prior to visit.    Allergies  Allergen Reactions  . Ace Inhibitors Cough  . Morphine And Related Other (See Comments)    STATES HORRIBLE HALLUCINATIONS.    Physical exam:  Today's Vitals   06/10/20 1455  BP: 108/68  Pulse: 72  Weight: 248 lb (112.5 kg)  Height: 5\' 9"  (1.753 m)   Body mass index is 36.62 kg/m.   Wt Readings from Last 3 Encounters:  06/10/20 248 lb (112.5 kg)  03/12/20 252 lb (114.3 kg)  02/12/20 255 lb (115.7 kg)     Ht Readings from Last 3 Encounters:  06/10/20 5\' 9"  (1.753 m)  03/12/20 5\' 9"  (1.753 m)  02/12/20 5\' 9"  (1.753 m)      General: The patient is awake, alert and appears not in acute distress. The patient is well groomed. Head: Normocephalic, atraumatic. Neck is supple. Mallampati 3 19,  neck circumference: 19 inches . Dental status: no dentures.  Cardiovascular:  Regular rate and cardiac rhythm by pulse,  without distended neck veins.  Respiratory: Lungs are clear to auscultation, but restricted capacity.  Skin:  With evidence of ankle edema, or rash. Trunk: The patient's posture is erect.   Neurologic exam : The patient is awake and alert, oriented to place and time.   Memory subjective described as intact.  Attention span & concentration ability appears normal.  Speech is fluent,  without  dysarthria, dysphonia or aphasia.  Mood and affect are appropriate.   Cranial nerves: no loss of smell or taste reported  Pupils are equal and briskly reactive to light. Funduscopic exam deferred. .  Extraocular movements in vertical and horizontal planes were intact and without nystagmus. No Diplopia. Visual fields by finger perimetry are intact. Hearing was intact to soft voice and finger rubbing.   Facial sensation intact to fine touch. Facial motor strength is symmetric and tongue and uvula move midline.  Neck ROM : rotation, tilt and flexion extension were normal for age and shoulder shrug was  symmetrical.    Motor exam:  Symmetric bulk, tone and ROM.   Normal tone without cog wheeling, symmetric grip strength . Sensory:  Fine touch, pinprick and vibration were normal.  Proprioception tested in the upper extremities was normal. Coordination: Rapid alternating movements in the fingers/hands were of normal speed.  The Finger-to-nose maneuver was intact without evidence of ataxia, dysmetria or tremor. Gait and station: Patient could rise unassisted from a seated position, walked without assistive device.  Stance is of normal width/ base .  Toe and heel walk were deferred.  Deep tendon reflexes: in the  upper and lower extremities are symmetric and intact.  Babinski response was deferred.      After spending a total time of  15 minutes face to face and for physical and neurologic examination, review of laboratory studies,  personal review of imaging studies, reports and results of other testing and review of referral information / records as far as provided in visit, I have established the following assessments:  1) machine works well for his apnea - he may benefit form more pressure, but the residual AHI is food - air leaks remain but mask is comfortable, I will let it be.   2) depression, underlying. 3) his CKD grade 3 or 4, will certainly contribute to fatigue.    My Plan is to proceed with:  In short, JEROD MCQUAIN is presenting with excessive fatigue , a symptom that can be attributed to many of his chronic conditions, but seem not related to apnea per se.    I plan to follow up either personally or through our NP within 12 month.    Electronically signed by: Larey Seat, MD 06/10/2020 3:14 PM  [facility] Board certified by The [employer] of Sleep Medicine and Diplomate of the [employer] of Sleep Medicine. Board certified In Neurology through the [place], Fellow of the [employer] of Neurology. Medical Director  of [facility].

## 2020-06-17 DIAGNOSIS — N1832 Chronic kidney disease, stage 3b: Secondary | ICD-10-CM | POA: Diagnosis not present

## 2020-06-17 DIAGNOSIS — M1000 Idiopathic gout, unspecified site: Secondary | ICD-10-CM | POA: Diagnosis not present

## 2020-06-17 DIAGNOSIS — N2581 Secondary hyperparathyroidism of renal origin: Secondary | ICD-10-CM | POA: Diagnosis not present

## 2020-06-17 DIAGNOSIS — N189 Chronic kidney disease, unspecified: Secondary | ICD-10-CM | POA: Diagnosis not present

## 2020-06-17 DIAGNOSIS — I2510 Atherosclerotic heart disease of native coronary artery without angina pectoris: Secondary | ICD-10-CM | POA: Diagnosis not present

## 2020-06-17 DIAGNOSIS — E039 Hypothyroidism, unspecified: Secondary | ICD-10-CM | POA: Diagnosis not present

## 2020-07-30 DIAGNOSIS — Z23 Encounter for immunization: Secondary | ICD-10-CM | POA: Diagnosis not present

## 2020-08-07 DIAGNOSIS — M170 Bilateral primary osteoarthritis of knee: Secondary | ICD-10-CM | POA: Diagnosis not present

## 2020-08-20 ENCOUNTER — Encounter: Payer: Self-pay | Admitting: Internal Medicine

## 2020-08-20 ENCOUNTER — Other Ambulatory Visit: Payer: Self-pay

## 2020-08-20 ENCOUNTER — Ambulatory Visit: Payer: PPO | Admitting: Internal Medicine

## 2020-08-20 LAB — BASIC METABOLIC PANEL
BUN: 33 mg/dL — ABNORMAL HIGH (ref 6–23)
CO2: 27 mEq/L (ref 19–32)
Calcium: 9.9 mg/dL (ref 8.4–10.5)
Chloride: 105 mEq/L (ref 96–112)
Creatinine, Ser: 2.02 mg/dL — ABNORMAL HIGH (ref 0.40–1.50)
GFR: 32.12 mL/min — ABNORMAL LOW (ref >60.00)
Glucose, Bld: 92 mg/dL (ref 70–99)
Potassium: 4.3 mEq/L (ref 3.5–5.1)
Sodium: 139 mEq/L (ref 135–145)

## 2020-08-20 LAB — ALBUMIN: Albumin: 4.4 g/dL (ref 3.5–5.2)

## 2020-08-20 LAB — VITAMIN D 25 HYDROXY (VIT D DEFICIENCY, FRACTURES): VITD: 28.57 ng/mL — ABNORMAL LOW (ref 30.00–100.00)

## 2020-08-20 NOTE — Patient Instructions (Addendum)
-   Stay Hydrated - AVOID all over the counter calcium tablets - Make sure you are consuming 2-3 servings of calcium daily in the diet     24-Hour Urine Collection   You will be collecting your urine for a 24-hour period of time.  Your timer starts with your first urine of the morning (For example - If you first pee at [place], your timer will start at [place])  [place] away your first urine of the morning  Collect your urine every time you pee for the next 24 hours STOP your urine collection 24 hours after you started the collection (For example - You would stop at 9AM the day after you started)

## 2020-08-20 NOTE — Progress Notes (Signed)
Name: Dalton Mitchell  MRN/ DOB: [id number], [birth year]    Age/ Sex: 74 y.o., male    PCP: Marton Redwood, MD   Reason for Endocrinology Evaluation: Hypercalcemia      Date of Initial Endocrinology Evaluation: 08/20/2020     HPI: Dalton Mitchell is a 74 y.o. male with a past medical history of CKD V, HTN and nephrolithiasis. The patient presented for initial endocrinology clinic visit on 08/20/2020 for consultative assistance with his Hypercalcemia.    Dalton Mitchell indicates that he was first diagnosed with hypercalcemia in 2015. Since that time, he has experienced symptoms of constipation, polyuria, but denies polydipsia, but has noted generalized weakness, denies diffuse muscle pains. He admits to the  use of over the counter calcium (including supplements,and occasional  Tums)  lithium, HCTZ, or vitamin D supplements.   He has history of kidney stones he is on citrate drink which helps, and kidney disease,but no liver disease,or  granulomatous disease. He denies steoporosis or prior fractures. Daily dietary calcium intake: 1 servings . He denies  family history of osteoporosis, parathyroid disease. Grandmother with hx of Goiter     Brother with adrenal issues requiring sx   He has had CKD since at least 2011  HISTORY:  Past Medical History:  Past Medical History:  Diagnosis Date  . Adenomatous colon polyp   . Allergy    seasonal  . Arthritis   . CAD    Prior inferior MI treated with stent 1995    . 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 Depression    past in early 87s  . GERD 02/24/2008  . Gout   . History of kidney stones   . Hyperlipidemia   . Hypertensive heart disease   . Hypothyroidism   . Obesity (BMI 30-39.9)   . Old inferior wall myocardial infarction   . Sleep apnea    USES CPAP OCCASIONALLY    Past Surgical History:  Past Surgical History:  Procedure Laterality Date  . COLONOSCOPY  12/25/2014   MANY previously  .  CORONARY ANGIOPLASTY  1995  . CORONARY ANGIOPLASTY WITH STENT PLACEMENT  1996  . CORONARY ARTERY BYPASS GRAFT N/A 09/17/2013   Procedure: CORONARY ARTERY BYPASS GRAFTING (CABG);  Surgeon: Melrose Nakayama, MD;  Location: [facility];  Service: Open Heart Surgery;  Laterality: N/A;  . CYSTOSCOPY WITH RETROGRADE PYELOGRAM, URETEROSCOPY AND STENT PLACEMENT Right 01/11/2014   Procedure: CYSTOSCOPY WITH RETROGRADE PYELOGRAM, URETEROSCOPY AND STENT PLACEMENT;  Surgeon: Molli Hazard, MD;  [facility];  Service: Urology;  Laterality: Right;  . CYSTOSCOPY WITH RETROGRADE PYELOGRAM, URETEROSCOPY AND STENT PLACEMENT Right 02/01/2014   Procedure: CYSTOSCOPY WITH RETROGRADE PYELOGRAM, URETEROSCOPY AND STENT EXCHANGE;  Surgeon: Molli Hazard, MD;  Location[facility];  Service: Urology;  Laterality: Right;  . EXPLORATION POST OPERATIVE OPEN HEART N/A 09/17/2013   Procedure: EXPLORATION POST OPERATIVE OPEN HEART;  Surgeon: Melrose Nakayama, MD;  Location: [facility];  Service: Open Heart Surgery;  Laterality: N/A;  . HOLMIUM LASER APPLICATION Right 06/08/6386   Procedure: HOLMIUM LASER APPLICATION;  Surgeon: Molli Hazard, MD;  [facility];  Service: Urology;  Laterality: Right;  . LEFT HEART CATHETERIZATION WITH CORONARY ANGIOGRAM N/A 09/14/2013   Procedure: LEFT HEART CATHETERIZATION WITH CORONARY ANGIOGRAM;  Surgeon: Jacolyn Reedy, MD;  Location: [facility] CATH LAB;  Service: Cardiovascular;  Laterality: N/A;  . POLYPECTOMY        Social History:  reports  that he quit smoking about 51 years ago. He has never used smokeless tobacco. He reports that he does not drink alcohol and does not use drugs.  Family History: family history includes Colon cancer in his maternal grandmother; Colon polyps in his maternal grandmother; Emphysema in his mother.   HOME MEDICATIONS: Allergies as of 08/20/2020      Reactions   Ace Inhibitors Cough   Morphine And Related Other (See Comments)   STATES  HORRIBLE HALLUCINATIONS.      Medication List       Accurate as of August 20, 2020  9:33 AM. If you have any questions, ask your nurse or doctor.        allopurinol 300 MG tablet Commonly known as: ZYLOPRIM Take 300 mg by mouth at bedtime.   aspirin EC 325 MG tablet Take 325 mg by mouth at bedtime.   COQ-10 PO Take 100 mg by mouth daily.   ezetimibe 10 MG tablet Commonly known as: ZETIA Take 10 mg by mouth daily.   famotidine 20 MG tablet Commonly known as: PEPCID Take 20 mg by mouth daily as needed.   FLUTICASONE PROPIONATE (NASAL) NA Place 2 Inhalers into the nose daily.   HYDROcodone-acetaminophen 5-325 MG tablet Commonly known as: NORCO/VICODIN Take 1 tablet by mouth every 6 (six) hours as needed for moderate pain.   levothyroxine 88 MCG tablet Commonly known as: SYNTHROID Take 88 mcg by mouth daily before breakfast.   losartan 50 MG tablet Commonly known as: COZAAR Take 1 tablet by mouth daily.   metoprolol tartrate 50 MG tablet Commonly known as: LOPRESSOR Take 50 mg by mouth 2 (two) times daily.   multivitamin with minerals tablet Take 1 tablet by mouth daily.   Omega 3 1000 MG Caps Take 2,000 capsules by mouth daily.   OVER THE COUNTER MEDICATION Simply saline nasal spray   OVER THE COUNTER MEDICATION CBD ointment   rosuvastatin 20 MG tablet Commonly known as: CRESTOR Take 20 mg by mouth daily.   senna-docusate 8.6-50 MG tablet Commonly known as: Senokot S Take 1 tablet by mouth 2 (two) times daily. What changed:   when to take this  reasons to take this   TURMERIC PO Take 1,000 mg by mouth daily.   zolpidem 10 MG tablet Commonly known as: AMBIEN Take 1 tablet (10 mg total) by mouth at bedtime.   ZyrTEC Allergy 10 MG tablet Generic drug: cetirizine Take 10 mg by mouth daily.         REVIEW OF SYSTEMS: A comprehensive ROS was conducted with the patient and is negative except as per HPI     OBJECTIVE:  VS: There were  no vitals taken for this visit.   Wt Readings from Last 3 Encounters:  06/10/20 248 lb (112.5 kg)  03/12/20 252 lb (114.3 kg)  02/12/20 255 lb (115.7 kg)     EXAM: General: Pt appears well and is in NAD  Neck: General: Supple without adenopathy. Thyroid: Thyroid size normal.  No goiter or nodules appreciated.   Lungs: Clear with good BS bilat with no rales, rhonchi, or wheezes  Heart: Auscultation: RRR.  Abdomen: Normoactive bowel sounds, soft, nontender, without masses or organomegaly palpable  Extremities:  BL LE: No pretibial edema normal ROM and strength.  Skin: Hair: Texture and amount normal with gender appropriate distribution Skin Inspection: No rashes Skin Palpation: Skin temperature, texture, and thickness normal to palpation  Neuro: Cranial nerves: II - XII grossly intact  Motor: Normal strength  throughout DTRs: 2+ and symmetric in UE without delay in relaxation phase  Mental Status: Judgment, insight: Intact Orientation: Oriented to time, place, and person Mood and affect: No depression, anxiety, or agitation     DATA REVIEWED:   Results for DORRIAN, DOGGETT (MRN [id number]) as of 08/22/2020 11:51  Ref. Range 08/20/2020 14:45  Sodium Latest Ref Range: 135 - 145 mEq/L 139  Potassium Latest Ref Range: 3.5 - 5.1 mEq/L 4.3  Chloride Latest Ref Range: 96 - 112 mEq/L 105  CO2 Latest Ref Range: 19 - 32 mEq/L 27  Glucose Latest Ref Range: 70 - 99 mg/dL 92  BUN Latest Ref Range: 6 - 23 mg/dL 33 (H)  Creatinine Latest Ref Range: 0.40 - 1.50 mg/dL 2.02 (H)  Calcium Latest Ref Range: 8.4 - 10.5 mg/dL 9.9  Albumin Latest Ref Range: 3.5 - 5.2 g/dL 4.4  GFR Latest Ref Range: >60.00 mL/min 32.12 (L)  VITD Latest Ref Range: 30.00 - 100.00 ng/mL 28.57 (L)  PTH, Intact Latest Ref Range: 14 - 64 pg/mL 64    05/05/2020 BUN/CR 22/1.96 GFR 33 Na 142 K 5.1  Calcium 10.8 mg/dL Albumin 4.9 g/dL  PTH 51 pg/mL   ASSESSMENT/PLAN/RECOMMENDATIONS:   1. Hypercalcemia :  - We  discussed primary vs tertiary hyperparathyroidism in the setting of CKD but repeat calcium and PTH are normal.  - In review of his labs from 05/2020 he had an elevated Albumin of 4.9 with a serum calcium of 10.8 mg/dL with a corrected calcium at 10.08 mg/dL  - Corrected calcium on today's labs 9.58 mg/dL   Recommendation  - Stay Hydrated - AVOID all over the counter calcium tablets for now  - Make sure you are consuming 2-3 servings of calcium daily in the diet     2. Vitamin D insufficiency   - Start Vitamin D 1000 iu daily    Discussed results with pt on 08/22/2020   F/U in 3 months   Signed electronically by: Mack Guise, MD  [facility] Endocrinology  [facility] [address] [place], [place] [address] Phone: [phone] FAX: [phone]   CC: Marton Redwood, [place] [address] Phone: [phone] Fax: [phone]   Return to Endocrinology clinic as below: Future Appointments  Date Time Provider [place]  08/20/2020  2:40 PM Ebelin Dillehay, Melanie Crazier, MD [facility] None  09/18/2020  3:15 PM Adrian Prows, MD [facility] None  06/10/2021  2:30 PM Dohmeier, Asencion Partridge, MD [facility] None

## 2020-08-21 LAB — PARATHYROID HORMONE, INTACT (NO CA): PTH: 64 pg/mL (ref 14–64)

## 2020-08-22 ENCOUNTER — Other Ambulatory Visit: Payer: PPO

## 2020-08-22 ENCOUNTER — Other Ambulatory Visit: Payer: Self-pay

## 2020-08-23 LAB — CREATININE, URINE, 24 HOUR: Creatinine, 24H Ur: 1.8 g/(24.h) (ref 0.50–2.15)

## 2020-08-23 LAB — EXTRA URINE SPECIMEN

## 2020-08-23 LAB — CALCIUM, URINE, 24 HOUR: Calcium, 24H Urine: 87 mg/24 h

## 2020-08-25 ENCOUNTER — Encounter: Payer: Self-pay | Admitting: Internal Medicine

## 2020-09-11 ENCOUNTER — Ambulatory Visit: Payer: PPO | Admitting: Cardiology

## 2020-09-18 ENCOUNTER — Encounter: Payer: Self-pay | Admitting: Cardiology

## 2020-09-18 ENCOUNTER — Ambulatory Visit: Payer: PPO | Admitting: Cardiology

## 2020-09-18 ENCOUNTER — Other Ambulatory Visit: Payer: Self-pay

## 2020-09-18 VITALS — BP 120/86 | HR 79 | Resp 16 | Ht 69.0 in | Wt 246.8 lb

## 2020-09-18 DIAGNOSIS — I2510 Atherosclerotic heart disease of native coronary artery without angina pectoris: Secondary | ICD-10-CM

## 2020-09-18 DIAGNOSIS — G4733 Obstructive sleep apnea (adult) (pediatric): Secondary | ICD-10-CM

## 2020-09-18 DIAGNOSIS — R0609 Other forms of dyspnea: Secondary | ICD-10-CM

## 2020-09-18 DIAGNOSIS — Z9989 Dependence on other enabling machines and devices: Secondary | ICD-10-CM

## 2020-09-18 DIAGNOSIS — I10 Essential (primary) hypertension: Secondary | ICD-10-CM

## 2020-09-18 DIAGNOSIS — N184 Chronic kidney disease, stage 4 (severe): Secondary | ICD-10-CM

## 2020-09-18 DIAGNOSIS — R0600 Dyspnea, unspecified: Secondary | ICD-10-CM

## 2020-09-18 DIAGNOSIS — G4734 Idiopathic sleep related nonobstructive alveolar hypoventilation: Secondary | ICD-10-CM | POA: Diagnosis not present

## 2020-09-18 DIAGNOSIS — E7800 Pure hypercholesterolemia, unspecified: Secondary | ICD-10-CM | POA: Diagnosis not present

## 2020-09-18 DIAGNOSIS — Z951 Presence of aortocoronary bypass graft: Secondary | ICD-10-CM | POA: Diagnosis not present

## 2020-09-18 NOTE — Progress Notes (Signed)
Primary Physician/Referring:  Marton Redwood, MD  Patient ID: Dalton Mitchell, male    DOB: [birth year], 74 y.o.   MRN: [id number]  Chief Complaint  Patient presents with  . Coronary Artery Disease  . Follow-up    6 month   HPI:    Dalton Mitchell  is a 74 y.o. Caucasian male with coronary artery disease, obesity, hypertension, hyperlipidemia, stage IV chronic kidney disease, history of inferior MI in 1995 SP balloon angioplasty to RCA, history of CABG 1215 2014x4 presents here to discuss fatigue and malaise. He does have sleep apnea and has not been compliant with CPAP, follows Dr. Brett Fairy.  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  He continues to complain of mild dyspnea on exertion but most importantly marked daytime somnolence and chronic fatigue.  Denies chest pain, palpitation, dizziness or syncope.  Denies leg edema, PND or orthopnea.  Past Medical History:  Diagnosis Date  . Adenomatous colon polyp   . Allergy    seasonal  . Arthritis   . CAD    Prior inferior MI treated with stent 1995    . 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 Depression    past in early 4s  . GERD 02/24/2008  . Gout   . History of kidney stones   . Hyperlipidemia   . Hypertensive heart disease   . Hypothyroidism   . Obesity (BMI 30-39.9)   . Old inferior wall myocardial infarction   . Sleep apnea    USES CPAP OCCASIONALLY   Past Surgical History:  Procedure Laterality Date  . COLONOSCOPY  12/25/2014   [place]Y previously  . CORONARY ANGIOPLASTY  1995  . CORONARY ANGIOPLASTY WITH STENT PLACEMENT  1996  . CORONARY ARTERY BYPASS GRAFT N/A 09/17/2013   Procedure: CORONARY ARTERY BYPASS GRAFTING (CABG);  Surgeon: Melrose Nakayama, MD;  Location: [place] [facility];  Service: Open Heart Surgery;  Laterality: N/A;  . CYSTOSCOPY WITH RETROGRADE PYELOGRAM, URETEROSCOPY AND STENT PLACEMENT Right 01/11/2014   Procedure:  CYSTOSCOPY WITH RETROGRADE PYELOGRAM, URETEROSCOPY AND STENT PLACEMENT;  Surgeon: Molli Hazard, MD;  [facility];  Service: Urology;  Laterality: Right;  . CYSTOSCOPY WITH RETROGRADE PYELOGRAM, URETEROSCOPY AND STENT PLACEMENT Right 02/01/2014   Procedure: CYSTOSCOPY WITH RETROGRADE PYELOGRAM, URETEROSCOPY AND STENT EXCHANGE;  Surgeon: Molli Hazard, MD;  [facility];  Service: Urology;  Laterality: Right;  . EXPLORATION POST OPERATIVE OPEN HEART N/A 09/17/2013   Procedure: EXPLORATION POST OPERATIVE OPEN HEART;  Surgeon: Melrose Nakayama, MD;  Location: [facility];  Service: Open Heart Surgery;  Laterality: N/A;  . HOLMIUM LASER APPLICATION Right 05/08/2777   Procedure: HOLMIUM LASER APPLICATION;  Surgeon: Molli Hazard, MD;  [facility];  Service: Urology;  Laterality: Right;  . LEFT HEART CATHETERIZATION WITH CORONARY ANGIOGRAM N/A 09/14/2013   Procedure: LEFT HEART CATHETERIZATION WITH CORONARY ANGIOGRAM;  Surgeon: Jacolyn Reedy, MD;  Location: [facility] C[facility];  Service: Cardiovascular;  Laterality: N/A;  . POLYPECTOMY     Family History  Problem Relation Age of Onset  . Colon cancer Maternal Grandmother   . Colon polyps Maternal Grandmother   . Emphysema Mother   . Esophageal cancer Neg Hx   . Rectal cancer Neg Hx   . Stomach cancer Neg Hx     Social History   Tobacco Use  . Smoking status: Former Smoker    Quit date: 1970    Years  since quitting: 51.9  . Smokeless tobacco: Never Used  . Tobacco comment: Quit 1971  Substance Use Topics  . Alcohol use: No   Marital Status: Married  ROS  Review of Systems  Constitutional: Positive for malaise/fatigue.  Cardiovascular: Negative for dyspnea on exertion and leg swelling.  Respiratory: Positive for snoring (not compliant with CPAP).   Gastrointestinal: Negative for melena.  Psychiatric/Behavioral: Positive for depression. The patient has insomnia and is nervous/anxious.    Objective  Blood  pressure 120/86, pulse 79, resp. rate 16, height 5\' 9"  (1.753 m), weight 246 lb 12.8 oz (111.9 kg), SpO2 96 %.  Vitals with BMI 09/18/2020 08/20/2020 06/10/2020  Height 5\' 9"  5\' 9"  5\' 9"   Weight 246 lbs 13 oz 247 lbs 8 oz 248 lbs  BMI 36.43 78.93 81.01  Systolic 751 025 852  Diastolic 86 82 68  Pulse 79 76 72     Physical Exam Constitutional:      Comments: He is well-developed, moderately obese in no acute distress.  Cardiovascular:     Rate and Rhythm: Normal rate and regular rhythm.     Pulses: Intact distal pulses.     Heart sounds: Normal heart sounds. No murmur heard. No gallop.      Comments: No leg edema, no JVD. Pulmonary:     Effort: Pulmonary effort is normal.     Breath sounds: Normal breath sounds.  Abdominal:     General: Bowel sounds are normal.     Palpations: Abdomen is soft.    Laboratory examination:   Recent Labs    08/20/20 1445  NA 139  K 4.3  CL 105  CO2 27  GLUCOSE 92  BUN 33*  CREATININE 2.02*  CALCIUM 9.9   CrCl cannot be calculated (Patient's most recent lab result is older than the maximum 21 days allowed.).  CMP Latest Ref Rng & Units 08/20/2020 02/01/2014 01/25/2014  Glucose 70 - 99 mg/dL 92 91 98  BUN 6 - 23 mg/dL 33(H) - 38(H)  Creatinine 0.40 - 1.50 mg/dL 2.02(H) - 2.51(H)  Sodium 135 - 145 mEq/L 139 142 140  Potassium 3.5 - 5.1 mEq/L 4.3 4.3 5.4(H)  Chloride 96 - 112 mEq/L 105 - 103  CO2 19 - 32 mEq/L 27 - 25  Calcium 8.4 - 10.5 mg/dL 9.9 - 10.5  Total Protein 6.0 - 8.3 g/dL - - -  Total Bilirubin 0.3 - 1.2 mg/dL - - -  Alkaline Phos 39 - 117 U/L - - -  AST 0 - 37 U/L - - -  ALT 0 - 53 U/L - - -   CBC Latest Ref Rng & Units 02/01/2014 01/25/2014 01/10/2014  WBC 4.0 - 10.5 K/uL - 6.1 8.7  Hemoglobin 13.0 - 17.0 g/dL 14.6 13.1 13.6  Hematocrit 39.0 - 52.0 % 43.0 42.0 42.2  Platelets 150 - 400 K/uL - 235 223   Lipid Panel  No results found for: CHOL, TRIG, HDL, CHOLHDL, VLDL, LDLCALC, LDLDIRECT HEMOGLOBIN A1C Lab Results   Component Value Date   HGBA1C 5.8 (H) 09/21/2013   MPG 120 (H) 09/21/2013    Lipid Panel  No results found for: CHOL, TRIG, HDL, CHOLHDL, VLDL, LDLCALC, LDLDIRECT, LABVLDL   External labs:   Cholesterol, total 125.000 m 03/25/2020 HDL 38 MG/DL 03/25/2020 LDL 63.000 mg 03/25/2020 Triglycerides 122.000 03/25/2020  A1C 5.700 % 03/25/2020 TSH 1.730 05/05/2020  Hemoglobin 15.900 G/ 06/17/2020 Creatinine, Serum 2.020 mg/ 08/20/2020 Potassium 4.300 mEq 08/20/2020 ALT (SGPT) 24.000 uni 03/25/2020  Cholesterol, total 133.000 m 02/22/2019 HDL 36 MG/DL 02/22/2019 LDL 73.000 mg 02/22/2019 Triglycerides 122.000 02/22/2019  A1C 5.800 % 02/22/2019  Hemoglobin 15.000 g/ 02/22/2019  Creatinine, Serum 1.900 mg/ 02/22/2019 Potassium 4.900 mEq 04/19/2017 ALT (SGPT) 21.000 uni 02/22/2019 TSH 1.020 02/22/2019  Medications and allergies   Allergies  Allergen Reactions  . Ace Inhibitors Cough  . Morphine And Related Other (See Comments)    STATES HORRIBLE HALLUCINATIONS.    Current Outpatient Medications on File Prior to Visit  Medication Sig Dispense Refill  . allopurinol (ZYLOPRIM) 300 MG tablet Take 300 mg by mouth at bedtime.    [name] aspirin EC 325 MG tablet Take 325 mg by mouth at bedtime.    . cetirizine (ZYRTEC) 10 MG tablet Take 10 mg by mouth daily.    . Coenzyme Q10 (COQ-10 PO) Take 100 mg by mouth daily.    [name] escitalopram (LEXAPRO) 10 MG tablet Take by mouth.    . ezetimibe (ZETIA) 10 MG tablet Take 10 mg by mouth daily.    . famotidine (PEPCID) 20 MG tablet Take 20 mg by mouth daily as needed.     [name] FLUTICASONE PROPIONATE, NASAL, NA Place 2 Inhalers into the nose daily.    [name] levothyroxine (SYNTHROID, LEVOTHROID) 88 MCG tablet Take 88 mcg by mouth daily before breakfast.    . losartan (COZAAR) 50 MG tablet Take 1 tablet by mouth daily.    . metoprolol (LOPRESSOR) 50 MG tablet Take 50 mg by mouth 2 (two) times daily.    [name] OVER THE COUNTER MEDICATION CBD ointment    . rosuvastatin  (CRESTOR) 20 MG tablet Take 20 mg by mouth daily.    [name] senna-docusate (SENOKOT S) 8.6-50 MG per tablet Take 1 tablet by mouth 2 (two) times daily. (Patient taking differently: Take 1 tablet by mouth as needed.) 60 tablet 0  . zolpidem (AMBIEN) 10 MG tablet Take 1 tablet (10 mg total) by mouth at bedtime. 30 tablet 1  . ALPRAZolam (XANAX) 0.5 MG tablet Take 0.25-0.5 mg by mouth 2 (two) times daily. (Patient not taking: Reported on 09/18/2020)     No current facility-administered medications on file prior to visit.    Radiology:    Cardiac Studies:   Echocardiogram 09/13/2013:   - Left ventricle: The cavity size was normal. Wall thickness was increased in a pattern of mild LVH. Systolic function was normal. The estimated ejection fraction was in the range of 50% to 55%. Wall motion was normal; there were no regional wall motion abnormalities. Doppler parameters are  consistent with abnormal left ventricular relaxation(grade 1 diastolic dysfunction).  - Left atrium: The atrium was mildly dilated.   Coronary angiogram 09/14/2013: Proximal LAD 80%, OM1 95% stenosis, proximal RCA subtotal stenosis.  CABG x 4 09/17/2013: Utilizing LIMA to LAD, SVG to OM2, and a sequential SVG to PDA and distal RCA.  Sleep Study 10/11/2016: 1. Mild Obstructive Sleep Apnea (OSA), mainly hypopnea. REM AHI was 32.2, making this apnea not suitable for mandibular advancement therapy.  2. Hypoxemia for 164 minutes.  3. 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 4. Snoring   Lexiscan (Walking with mod Bruce)Tetrofosmin Stress Test  02/06/2020: Nondiagnostic ECG stress. Mild degree medium extent perfusion defect consistent with mild (reversible) ischemia located in the mid inferolateral wall and basal inferolateral wall  (Left Circumflex Artery region) of left ventricle. Overall LV systolic function is normal without regional wall motion abnormalities. Stress LV EF: 63%.  No previous exam  available for comparison.  Low risk.   EKG    EKG 09/18/2020: Normal sinus rhythm at a rate of 74 bpm, left atrial enlargement, left axis deviation, cannot exclude inferior infarct old.  Early repolarization, inferior leads.  No evidence of ischemia, normal QT interval.  No significant change from 11/14/2019.    Assessment     ICD-10-CM   1. Coronary artery disease involving native coronary artery of native heart without angina pectoris  I25.10 EKG 12-Lead  2. Hx of CABG x 4 09/17/2013:  LIMA to LAD, SVG to OM2, and SVG to PDA-distal RCA.  Z95.1   3. Dyspnea on exertion  R06.00 AMB referral to pulmonary rehabilitation  4. Obstructive sleep apnea on CPAP  G47.33 AMB referral to pulmonary rehabilitation   Z99.89   5. Primary hypertension  I10   6. Stage 4 chronic kidney disease ([place])  N18.4   7. Hypercholesteremia  E78.00   8. Nocturnal hypoxemia  G47.34 AMB referral to pulmonary rehabilitation   No orders of the defined types were placed in this encounter.   Medications Discontinued During This Encounter  Medication Reason  . HYDROcodone-acetaminophen (NORCO/VICODIN) 5-325 MG tablet Patient Preference  . Multiple Vitamins-Minerals (MULTIVITAMIN WITH MINERALS) tablet Patient Preference  . OVER THE COUNTER MEDICATION Patient Preference  . Omega 3 1000 MG CAPS Patient Preference    Recommendations:   DEMARQUES PILZ  is a 74 y.o. Caucasian male with coronary artery disease, obesity, hypertension, hyperlipidemia, stage IV chronic kidney disease, history of inferior MI in 1995 SP balloon angioplasty to RCA, history of CABG 1215 2014x4 presents here to discuss fatigue and malaise. He does have sleep apnea and has not been compliant with CPAP, follows Dr. Brett Fairy.   He continues to have chronic dyspnea and also marked fatigue.  I will refer him for pulmonary rehab, patient was excited about rehab program hoping to increase his physical activity.  He also has nocturnal hypoxemia, suspect  this is also related to underlying OSA.  I have again stressed to him regarding being compliant with CPAP.   Blood pressure is well controlled, external records reviewed, lipids in excellent control as well.  I reassured him to increase his physical activity, weight loss discussed, I will see him back in 6 months for follow-up.    Adrian Prows, MD, [facility] 09/18/2020, 5:55 PM Office: [phone] Pager: [phone].

## 2020-09-23 ENCOUNTER — Telehealth: Payer: Self-pay | Admitting: Cardiology

## 2020-09-23 NOTE — Telephone Encounter (Signed)
I have left a voicemail for patient letting him know it is Pulmonary Rehab and why

## 2020-09-23 NOTE — Telephone Encounter (Signed)
Pulmonary rehab. He wont qualify for cardiac

## 2020-09-30 DIAGNOSIS — Z85820 Personal history of malignant melanoma of skin: Secondary | ICD-10-CM | POA: Diagnosis not present

## 2020-09-30 DIAGNOSIS — C4441 Basal cell carcinoma of skin of scalp and neck: Secondary | ICD-10-CM | POA: Diagnosis not present

## 2020-09-30 DIAGNOSIS — L820 Inflamed seborrheic keratosis: Secondary | ICD-10-CM | POA: Diagnosis not present

## 2020-09-30 DIAGNOSIS — D692 Other nonthrombocytopenic purpura: Secondary | ICD-10-CM | POA: Diagnosis not present

## 2020-09-30 DIAGNOSIS — L821 Other seborrheic keratosis: Secondary | ICD-10-CM | POA: Diagnosis not present

## 2020-09-30 DIAGNOSIS — D485 Neoplasm of uncertain behavior of skin: Secondary | ICD-10-CM | POA: Diagnosis not present

## 2020-10-04 HISTORY — PX: POLYPECTOMY: SHX149

## 2020-10-14 ENCOUNTER — Telehealth (HOSPITAL_COMMUNITY): Payer: Self-pay

## 2020-10-14 DIAGNOSIS — Z85820 Personal history of malignant melanoma of skin: Secondary | ICD-10-CM | POA: Diagnosis not present

## 2020-10-14 DIAGNOSIS — C4441 Basal cell carcinoma of skin of scalp and neck: Secondary | ICD-10-CM | POA: Diagnosis not present

## 2020-10-14 NOTE — Telephone Encounter (Signed)
Pt insurance is active and benefits verified through [employer] Co-pay $15, DED 0/0 met, out of pocket $3,400/0 met, co-insurance 0%. no pre-authorization required. Passport, [place] REF# [id number]

## 2020-10-21 ENCOUNTER — Telehealth (HOSPITAL_COMMUNITY): Payer: Self-pay | Admitting: *Deleted

## 2020-11-13 ENCOUNTER — Encounter (HOSPITAL_COMMUNITY): Payer: Self-pay | Admitting: *Deleted

## 2020-11-13 NOTE — Progress Notes (Signed)
Called patient to confirm his pulmonary orientation appointment. I was unable to reach him but ask him to return call. He called and has confirmed that he will be here for his appointment. He was given directions to the department.

## 2020-11-14 ENCOUNTER — Telehealth (HOSPITAL_COMMUNITY): Payer: Self-pay | Admitting: Internal Medicine

## 2020-11-14 ENCOUNTER — Ambulatory Visit (HOSPITAL_COMMUNITY): Payer: PPO

## 2020-11-18 ENCOUNTER — Ambulatory Visit (HOSPITAL_COMMUNITY): Payer: PPO

## 2020-11-19 ENCOUNTER — Ambulatory Visit: Payer: PPO | Admitting: Internal Medicine

## 2020-11-19 NOTE — Progress Notes (Deleted)
Name: Dalton Mitchell  MRN/ DOB: [id number], 1946/03/31    Age/ Sex: 75 y.o., male     PCP: Marton Redwood, MD   Reason for Endocrinology Evaluation: Hypercalcemia      Initial Endocrinology Clinic Visit: 08/20/2020    PATIENT IDENTIFIER: Dalton Mitchell is a 75 y.o., male with a past medical history of CKD V, HTN and nephrolithiasis. He has followed with [facility] since 08/20/2020 for consultative assistance with management of his hypercalcemia    HISTORICAL SUMMARY: The patient was first diagnosed with hypercalcemia in 2015. On his initial visit to our clinic his PTH was normal at 64 pg/mL and a normal serum calcium of 9.9 mg/dL with c He has history of kidney stones he is on citrate drink which helps,he has had CKD since at least 2011   Brother with adrenal issues requiring sx  SUBJECTIVE:    Today (11/19/2020):  Dalton Mitchell is here for hypercalcemia.       HISTORY:  Past Medical History:  Past Medical History:  Diagnosis Date  . Adenomatous colon polyp   . Allergy    seasonal  . Arthritis   . CAD    Prior inferior MI treated with stent 1995    . 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 Depression    past in early 48s  . GERD 02/24/2008  . Gout   . History of kidney stones   . Hyperlipidemia   . Hypertensive heart disease   . Hypothyroidism   . Obesity (BMI 30-39.9)   . Old inferior wall myocardial infarction   . Sleep apnea    USES CPAP OCCASIONALLY    Past Surgical History:  Past Surgical History:  Procedure Laterality Date  . COLONOSCOPY  12/25/2014   [place] previously  . CORONARY ANGIOPLASTY  1995  . CORONARY ANGIOPLASTY WITH STENT PLACEMENT  1996  . CORONARY ARTERY BYPASS GRAFT N/A 09/17/2013   Procedure: CORONARY ARTERY BYPASS GRAFTING (CABG);  Surgeon: Melrose Nakayama, MD;  Location: [facility];  Service: Open Heart Surgery;  Laterality: N/A;  . CYSTOSCOPY WITH RETROGRADE PYELOGRAM, URETEROSCOPY AND  STENT PLACEMENT Right 01/11/2014   Procedure: CYSTOSCOPY WITH RETROGRADE PYELOGRAM, URETEROSCOPY AND STENT PLACEMENT;  Surgeon: Molli Hazard, MD;  Location[facility];  Service: Urology;  Laterality: Right;  . CYSTOSCOPY WITH RETROGRADE PYELOGRAM, URETEROSCOPY AND STENT PLACEMENT Right 02/01/2014   Procedure: CYSTOSCOPY WITH RETROGRADE PYELOGRAM, URETEROSCOPY AND STENT EXCHANGE;  Surgeon: Molli Hazard, MD;  Location[facility];  Service: Urology;  Laterality: Right;  . EXPLORATION POST OPERATIVE OPEN HEART N/A 09/17/2013   Procedure: EXPLORATION POST OPERATIVE OPEN HEART;  Surgeon: Melrose Nakayama, MD;  Location: [facility];  Service: Open Heart Surgery;  Laterality: N/A;  . HOLMIUM LASER APPLICATION Right 01/05/101   Procedure: HOLMIUM LASER APPLICATION;  Surgeon: Molli Hazard, MD;  [facility];  Service: Urology;  Laterality: Right;  . LEFT HEART CATHETERIZATION WITH CORONARY ANGIOGRAM N/A 09/14/2013   Procedure: LEFT HEART CATHETERIZATION WITH CORONARY ANGIOGRAM;  Surgeon: Jacolyn Reedy, MD;  Location: [facility] C[facility];  Service: Cardiovascular;  Laterality: N/A;  . POLYPECTOMY       Social History:  reports that he quit smoking about 52 years ago. He has never used smokeless tobacco. He reports that he does not drink alcohol and does not use drugs. Family History:  Family History  Problem Relation Age of Onset  . Colon cancer Maternal Grandmother   .  Colon polyps Maternal Grandmother   . Emphysema Mother   . Esophageal cancer Neg Hx   . Rectal cancer Neg Hx   . Stomach cancer Neg Hx       HOME MEDICATIONS: Allergies as of 11/19/2020      Reactions   Ace Inhibitors Cough   Morphine And Related Other (See Comments)   STATES HORRIBLE HALLUCINATIONS.      Medication List       Accurate as of November 19, 2020 12:34 PM. If you have any questions, ask your nurse or doctor.        allopurinol 300 MG tablet Commonly known as: ZYLOPRIM Take 300 mg by mouth  at bedtime.   ALPRAZolam 0.5 MG tablet Commonly known as: XANAX Take 0.25-0.5 mg by mouth 2 (two) times daily.   aspirin EC 325 MG tablet Take 325 mg by mouth at bedtime.   cetirizine 10 MG tablet Commonly known as: ZYRTEC Take 10 mg by mouth daily.   COQ-10 PO Take 100 mg by mouth daily.   escitalopram 10 MG tablet Commonly known as: LEXAPRO Take by mouth.   ezetimibe 10 MG tablet Commonly known as: ZETIA Take 10 mg by mouth daily.   famotidine 20 MG tablet Commonly known as: PEPCID Take 20 mg by mouth daily as needed.   FLUTICASONE PROPIONATE (NASAL) NA Place 2 Inhalers into the nose daily.   levothyroxine 88 MCG tablet Commonly known as: SYNTHROID Take 88 mcg by mouth daily before breakfast.   losartan 50 MG tablet Commonly known as: COZAAR Take 1 tablet by mouth daily.   metoprolol tartrate 50 MG tablet Commonly known as: LOPRESSOR Take 50 mg by mouth 2 (two) times daily.   OVER THE COUNTER MEDICATION CBD ointment   rosuvastatin 20 MG tablet Commonly known as: CRESTOR Take 20 mg by mouth daily.   senna-docusate 8.6-50 MG tablet Commonly known as: Senokot S Take 1 tablet by mouth 2 (two) times daily. What changed:   when to take this  reasons to take this   zolpidem 10 MG tablet Commonly known as: AMBIEN Take 1 tablet (10 mg total) by mouth at bedtime.         OBJECTIVE:   PHYSICAL EXAM: VS: There were no vitals taken for this visit.   EXAM: General: Pt appears well and is in NAD  Hydration: Well-hydrated with moist mucous membranes and good skin turgor  Eyes: External eye exam normal without stare, lid lag or exophthalmos.  EOM intact.  PERRL.  Ears, Nose, Throat: Hearing: Grossly intact bilaterally Dental: Good dentition  Throat: Clear without mass, erythema or exudate  Neck: General: Supple without adenopathy. Thyroid: Thyroid size normal.  No goiter or nodules appreciated. No thyroid bruit.  Lungs: Clear with good BS bilat with  no rales, rhonchi, or wheezes  Heart: Auscultation: RRR.  Abdomen: Normoactive bowel sounds, soft, nontender, without masses or organomegaly palpable  Extremities: Gait and station: Normal gait  Digits and nails: No clubbing, cyanosis, petechiae, or nodes Head and neck: Normal alignment and mobility BL UE: Normal ROM and strength. BL LE: No pretibial edema normal ROM and strength.  Skin: Hair: Texture and amount normal with gender appropriate distribution Skin Inspection: No rashes, acanthosis nigricans/skin tags. No lipohypertrophy Skin Palpation: Skin temperature, texture, and thickness normal to palpation  Neuro: Cranial nerves: II - XII grossly intact  Cerebellar: Normal coordination and movement; no tremor Motor: Normal strength throughout DTRs: 2+ and symmetric in UE without delay in relaxation phase  Mental Status: Judgment, insight: Intact Orientation: Oriented to time, place, and person Memory: Intact for recent and remote events Mood and affect: No depression, anxiety, or agitation     DATA REVIEWED: ***   05/05/2020 BUN/CR 22/1.96 GFR 33 Na 142 K 5.1  Calcium 10.8 mg/dL Albumin 4.9 g/dL  PTH 51 pg/mL  ASSESSMENT / PLAN / RECOMMENDATIONS:  1. 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  - In review of his labs from 05/2020 he had an elevated Albumin of 4.9 with a serum calcium of 10.8 mg/dL with a corrected calcium at 10.08 mg/dL  - Corrected calcium on today's labs 9.58 mg/dL   Recommendation  - Stay Hydrated - AVOID all over the counter calcium tablets for now  - Make sure you are consuming 2-3 servings of calcium daily in the diet     2. Vitamin D insufficiency   - Start Vitamin D 1000 iu daily     Signed electronically by: Mack Guise, MD  [facility]  [facility] [address], [place], [place]  [address] Phone: [phone] FAX: [phone]      CC: Marton Redwood, [place] [address] Phone: [phone]  Fax: [phone]   Return to Endocrinology clinic as below: Future Appointments  Date Time Provider [place]  11/19/2020  2:20 PM Shannell Mikkelsen, Melanie Crazier, MD [facility] None  03/20/2021  2:30 PM Adrian Prows, MD [facility] None  06/10/2021  2:30 PM Dohmeier, Asencion Partridge, MD [facility] None

## 2020-11-20 ENCOUNTER — Ambulatory Visit (HOSPITAL_COMMUNITY): Payer: PPO

## 2020-11-25 ENCOUNTER — Ambulatory Visit (HOSPITAL_COMMUNITY): Payer: PPO

## 2020-11-27 ENCOUNTER — Ambulatory Visit (HOSPITAL_COMMUNITY): Payer: PPO

## 2020-12-02 ENCOUNTER — Ambulatory Visit (HOSPITAL_COMMUNITY): Payer: PPO

## 2020-12-04 ENCOUNTER — Ambulatory Visit (HOSPITAL_COMMUNITY): Payer: PPO

## 2020-12-08 DIAGNOSIS — N5201 Erectile dysfunction due to arterial insufficiency: Secondary | ICD-10-CM | POA: Diagnosis not present

## 2020-12-08 DIAGNOSIS — N183 Chronic kidney disease, stage 3 unspecified: Secondary | ICD-10-CM | POA: Diagnosis not present

## 2020-12-08 DIAGNOSIS — D4101 Neoplasm of uncertain behavior of right kidney: Secondary | ICD-10-CM | POA: Diagnosis not present

## 2020-12-08 DIAGNOSIS — N281 Cyst of kidney, acquired: Secondary | ICD-10-CM | POA: Diagnosis not present

## 2020-12-08 DIAGNOSIS — N200 Calculus of kidney: Secondary | ICD-10-CM | POA: Diagnosis not present

## 2020-12-09 ENCOUNTER — Ambulatory Visit (HOSPITAL_COMMUNITY): Payer: PPO

## 2020-12-11 ENCOUNTER — Ambulatory Visit (HOSPITAL_COMMUNITY): Payer: PPO

## 2020-12-16 ENCOUNTER — Ambulatory Visit (HOSPITAL_COMMUNITY): Payer: PPO

## 2020-12-18 ENCOUNTER — Ambulatory Visit (HOSPITAL_COMMUNITY): Payer: PPO

## 2020-12-23 ENCOUNTER — Ambulatory Visit (HOSPITAL_COMMUNITY): Payer: PPO

## 2020-12-23 DIAGNOSIS — M170 Bilateral primary osteoarthritis of knee: Secondary | ICD-10-CM | POA: Diagnosis not present

## 2020-12-25 ENCOUNTER — Ambulatory Visit (HOSPITAL_COMMUNITY): Payer: PPO

## 2020-12-30 ENCOUNTER — Ambulatory Visit (HOSPITAL_COMMUNITY): Payer: PPO

## 2021-01-01 ENCOUNTER — Ambulatory Visit (HOSPITAL_COMMUNITY): Payer: PPO

## 2021-01-05 DIAGNOSIS — M170 Bilateral primary osteoarthritis of knee: Secondary | ICD-10-CM | POA: Diagnosis not present

## 2021-01-06 ENCOUNTER — Ambulatory Visit (HOSPITAL_COMMUNITY): Payer: PPO

## 2021-01-08 ENCOUNTER — Ambulatory Visit (HOSPITAL_COMMUNITY): Payer: PPO

## 2021-01-12 DIAGNOSIS — M170 Bilateral primary osteoarthritis of knee: Secondary | ICD-10-CM | POA: Diagnosis not present

## 2021-01-13 ENCOUNTER — Ambulatory Visit (HOSPITAL_COMMUNITY): Payer: PPO

## 2021-01-14 ENCOUNTER — Telehealth (HOSPITAL_COMMUNITY): Payer: Self-pay

## 2021-01-14 NOTE — Telephone Encounter (Signed)
Called patient to see if he was interested in participating in the Pulmonary Rehab Program. Patient stated yes. Patient will come in for orientation on 02/13/2021@1 :30pm and will attend the 1:15pm exercise class.  Tourist information centre manager.

## 2021-01-15 ENCOUNTER — Ambulatory Visit (HOSPITAL_COMMUNITY): Payer: PPO

## 2021-01-15 DIAGNOSIS — L820 Inflamed seborrheic keratosis: Secondary | ICD-10-CM | POA: Diagnosis not present

## 2021-01-15 DIAGNOSIS — B029 Zoster without complications: Secondary | ICD-10-CM | POA: Diagnosis not present

## 2021-01-15 DIAGNOSIS — Z85820 Personal history of malignant melanoma of skin: Secondary | ICD-10-CM | POA: Diagnosis not present

## 2021-01-19 DIAGNOSIS — M170 Bilateral primary osteoarthritis of knee: Secondary | ICD-10-CM | POA: Diagnosis not present

## 2021-01-29 DIAGNOSIS — I2510 Atherosclerotic heart disease of native coronary artery without angina pectoris: Secondary | ICD-10-CM | POA: Diagnosis not present

## 2021-01-29 DIAGNOSIS — E039 Hypothyroidism, unspecified: Secondary | ICD-10-CM | POA: Diagnosis not present

## 2021-01-29 DIAGNOSIS — N189 Chronic kidney disease, unspecified: Secondary | ICD-10-CM | POA: Diagnosis not present

## 2021-01-29 DIAGNOSIS — N1832 Chronic kidney disease, stage 3b: Secondary | ICD-10-CM | POA: Diagnosis not present

## 2021-01-29 DIAGNOSIS — N2581 Secondary hyperparathyroidism of renal origin: Secondary | ICD-10-CM | POA: Diagnosis not present

## 2021-01-29 DIAGNOSIS — M1000 Idiopathic gout, unspecified site: Secondary | ICD-10-CM | POA: Diagnosis not present

## 2021-02-12 ENCOUNTER — Telehealth (HOSPITAL_COMMUNITY): Payer: Self-pay | Admitting: *Deleted

## 2021-02-13 ENCOUNTER — Ambulatory Visit (HOSPITAL_COMMUNITY): Payer: PPO

## 2021-02-17 ENCOUNTER — Ambulatory Visit (HOSPITAL_COMMUNITY): Payer: PPO

## 2021-02-17 DIAGNOSIS — H25813 Combined forms of age-related cataract, bilateral: Secondary | ICD-10-CM | POA: Diagnosis not present

## 2021-02-17 DIAGNOSIS — H5203 Hypermetropia, bilateral: Secondary | ICD-10-CM | POA: Diagnosis not present

## 2021-02-17 DIAGNOSIS — H52223 Regular astigmatism, bilateral: Secondary | ICD-10-CM | POA: Diagnosis not present

## 2021-02-17 DIAGNOSIS — H524 Presbyopia: Secondary | ICD-10-CM | POA: Diagnosis not present

## 2021-02-19 ENCOUNTER — Ambulatory Visit (HOSPITAL_COMMUNITY): Payer: PPO

## 2021-02-24 ENCOUNTER — Ambulatory Visit (HOSPITAL_COMMUNITY): Payer: PPO

## 2021-02-24 DIAGNOSIS — G4733 Obstructive sleep apnea (adult) (pediatric): Secondary | ICD-10-CM | POA: Diagnosis not present

## 2021-02-24 DIAGNOSIS — K82A1 Gangrene of gallbladder in cholecystitis: Secondary | ICD-10-CM | POA: Diagnosis not present

## 2021-02-24 DIAGNOSIS — I252 Old myocardial infarction: Secondary | ICD-10-CM | POA: Diagnosis not present

## 2021-02-24 DIAGNOSIS — G4730 Sleep apnea, unspecified: Secondary | ICD-10-CM | POA: Diagnosis not present

## 2021-02-24 DIAGNOSIS — K8000 Calculus of gallbladder with acute cholecystitis without obstruction: Secondary | ICD-10-CM | POA: Diagnosis not present

## 2021-02-24 DIAGNOSIS — K819 Cholecystitis, unspecified: Secondary | ICD-10-CM | POA: Diagnosis not present

## 2021-02-24 DIAGNOSIS — N1832 Chronic kidney disease, stage 3b: Secondary | ICD-10-CM | POA: Diagnosis not present

## 2021-02-24 DIAGNOSIS — N183 Chronic kidney disease, stage 3 unspecified: Secondary | ICD-10-CM | POA: Diagnosis not present

## 2021-02-24 DIAGNOSIS — R1010 Upper abdominal pain, unspecified: Secondary | ICD-10-CM | POA: Diagnosis not present

## 2021-02-24 DIAGNOSIS — Z79899 Other long term (current) drug therapy: Secondary | ICD-10-CM | POA: Diagnosis not present

## 2021-02-24 DIAGNOSIS — I10 Essential (primary) hypertension: Secondary | ICD-10-CM | POA: Diagnosis not present

## 2021-02-24 DIAGNOSIS — K8020 Calculus of gallbladder without cholecystitis without obstruction: Secondary | ICD-10-CM | POA: Diagnosis not present

## 2021-02-24 DIAGNOSIS — R0689 Other abnormalities of breathing: Secondary | ICD-10-CM | POA: Diagnosis not present

## 2021-02-24 DIAGNOSIS — R001 Bradycardia, unspecified: Secondary | ICD-10-CM | POA: Diagnosis not present

## 2021-02-24 DIAGNOSIS — K821 Hydrops of gallbladder: Secondary | ICD-10-CM | POA: Diagnosis not present

## 2021-02-24 DIAGNOSIS — E669 Obesity, unspecified: Secondary | ICD-10-CM | POA: Diagnosis not present

## 2021-02-24 DIAGNOSIS — K8012 Calculus of gallbladder with acute and chronic cholecystitis without obstruction: Secondary | ICD-10-CM | POA: Diagnosis not present

## 2021-02-24 DIAGNOSIS — K8050 Calculus of bile duct without cholangitis or cholecystitis without obstruction: Secondary | ICD-10-CM | POA: Diagnosis not present

## 2021-02-24 DIAGNOSIS — R1084 Generalized abdominal pain: Secondary | ICD-10-CM | POA: Diagnosis not present

## 2021-02-24 DIAGNOSIS — K8051 Calculus of bile duct without cholangitis or cholecystitis with obstruction: Secondary | ICD-10-CM | POA: Diagnosis not present

## 2021-02-24 DIAGNOSIS — Z888 Allergy status to other drugs, medicaments and biological substances status: Secondary | ICD-10-CM | POA: Diagnosis not present

## 2021-02-24 DIAGNOSIS — I2510 Atherosclerotic heart disease of native coronary artery without angina pectoris: Secondary | ICD-10-CM | POA: Diagnosis not present

## 2021-02-24 DIAGNOSIS — N179 Acute kidney failure, unspecified: Secondary | ICD-10-CM | POA: Diagnosis not present

## 2021-02-24 DIAGNOSIS — R0902 Hypoxemia: Secondary | ICD-10-CM | POA: Diagnosis not present

## 2021-02-24 DIAGNOSIS — K838 Other specified diseases of biliary tract: Secondary | ICD-10-CM | POA: Diagnosis not present

## 2021-02-24 DIAGNOSIS — I129 Hypertensive chronic kidney disease with stage 1 through stage 4 chronic kidney disease, or unspecified chronic kidney disease: Secondary | ICD-10-CM | POA: Diagnosis not present

## 2021-02-24 DIAGNOSIS — Z9889 Other specified postprocedural states: Secondary | ICD-10-CM | POA: Diagnosis not present

## 2021-02-24 DIAGNOSIS — Z885 Allergy status to narcotic agent status: Secondary | ICD-10-CM | POA: Diagnosis not present

## 2021-02-24 DIAGNOSIS — E785 Hyperlipidemia, unspecified: Secondary | ICD-10-CM | POA: Diagnosis not present

## 2021-02-24 DIAGNOSIS — Z951 Presence of aortocoronary bypass graft: Secondary | ICD-10-CM | POA: Diagnosis not present

## 2021-02-25 DIAGNOSIS — N1832 Chronic kidney disease, stage 3b: Secondary | ICD-10-CM | POA: Diagnosis not present

## 2021-02-25 DIAGNOSIS — R1010 Upper abdominal pain, unspecified: Secondary | ICD-10-CM | POA: Diagnosis not present

## 2021-02-25 DIAGNOSIS — K8050 Calculus of bile duct without cholangitis or cholecystitis without obstruction: Secondary | ICD-10-CM | POA: Diagnosis not present

## 2021-02-25 DIAGNOSIS — K8020 Calculus of gallbladder without cholecystitis without obstruction: Secondary | ICD-10-CM | POA: Diagnosis not present

## 2021-02-25 DIAGNOSIS — N179 Acute kidney failure, unspecified: Secondary | ICD-10-CM | POA: Diagnosis not present

## 2021-02-26 ENCOUNTER — Ambulatory Visit (HOSPITAL_COMMUNITY): Payer: PPO

## 2021-02-26 DIAGNOSIS — K8050 Calculus of bile duct without cholangitis or cholecystitis without obstruction: Secondary | ICD-10-CM | POA: Diagnosis not present

## 2021-02-26 DIAGNOSIS — I2510 Atherosclerotic heart disease of native coronary artery without angina pectoris: Secondary | ICD-10-CM | POA: Diagnosis not present

## 2021-02-26 DIAGNOSIS — Z951 Presence of aortocoronary bypass graft: Secondary | ICD-10-CM | POA: Diagnosis not present

## 2021-02-26 DIAGNOSIS — I252 Old myocardial infarction: Secondary | ICD-10-CM | POA: Diagnosis not present

## 2021-02-27 DIAGNOSIS — I2510 Atherosclerotic heart disease of native coronary artery without angina pectoris: Secondary | ICD-10-CM | POA: Diagnosis not present

## 2021-02-27 DIAGNOSIS — I252 Old myocardial infarction: Secondary | ICD-10-CM | POA: Diagnosis not present

## 2021-02-27 DIAGNOSIS — K819 Cholecystitis, unspecified: Secondary | ICD-10-CM | POA: Diagnosis not present

## 2021-02-27 DIAGNOSIS — G4730 Sleep apnea, unspecified: Secondary | ICD-10-CM | POA: Diagnosis not present

## 2021-03-03 ENCOUNTER — Ambulatory Visit (HOSPITAL_COMMUNITY): Payer: PPO

## 2021-03-03 DIAGNOSIS — K8012 Calculus of gallbladder with acute and chronic cholecystitis without obstruction: Secondary | ICD-10-CM | POA: Diagnosis not present

## 2021-03-05 ENCOUNTER — Ambulatory Visit (HOSPITAL_COMMUNITY): Payer: PPO

## 2021-03-09 DIAGNOSIS — N179 Acute kidney failure, unspecified: Secondary | ICD-10-CM | POA: Diagnosis not present

## 2021-03-09 DIAGNOSIS — K819 Cholecystitis, unspecified: Secondary | ICD-10-CM | POA: Diagnosis not present

## 2021-03-09 DIAGNOSIS — I129 Hypertensive chronic kidney disease with stage 1 through stage 4 chronic kidney disease, or unspecified chronic kidney disease: Secondary | ICD-10-CM | POA: Diagnosis not present

## 2021-03-09 DIAGNOSIS — R748 Abnormal levels of other serum enzymes: Secondary | ICD-10-CM | POA: Diagnosis not present

## 2021-03-09 DIAGNOSIS — N184 Chronic kidney disease, stage 4 (severe): Secondary | ICD-10-CM | POA: Diagnosis not present

## 2021-03-10 ENCOUNTER — Ambulatory Visit (HOSPITAL_COMMUNITY): Payer: PPO

## 2021-03-12 ENCOUNTER — Ambulatory Visit (HOSPITAL_COMMUNITY): Payer: PPO

## 2021-03-17 ENCOUNTER — Ambulatory Visit (HOSPITAL_COMMUNITY): Payer: PPO

## 2021-03-19 ENCOUNTER — Ambulatory Visit (HOSPITAL_COMMUNITY): Payer: PPO

## 2021-03-20 ENCOUNTER — Encounter: Payer: Self-pay | Admitting: Cardiology

## 2021-03-20 ENCOUNTER — Ambulatory Visit: Payer: PPO | Admitting: Cardiology

## 2021-03-20 ENCOUNTER — Other Ambulatory Visit: Payer: Self-pay

## 2021-03-20 VITALS — BP 126/96 | HR 73 | Temp 97.2°F | Resp 17 | Ht 69.0 in | Wt 233.0 lb

## 2021-03-20 DIAGNOSIS — I2510 Atherosclerotic heart disease of native coronary artery without angina pectoris: Secondary | ICD-10-CM | POA: Diagnosis not present

## 2021-03-20 DIAGNOSIS — R0600 Dyspnea, unspecified: Secondary | ICD-10-CM

## 2021-03-20 DIAGNOSIS — R0609 Other forms of dyspnea: Secondary | ICD-10-CM | POA: Diagnosis not present

## 2021-03-20 DIAGNOSIS — N184 Chronic kidney disease, stage 4 (severe): Secondary | ICD-10-CM

## 2021-03-20 DIAGNOSIS — E7800 Pure hypercholesterolemia, unspecified: Secondary | ICD-10-CM | POA: Diagnosis not present

## 2021-03-20 DIAGNOSIS — Z951 Presence of aortocoronary bypass graft: Secondary | ICD-10-CM | POA: Diagnosis not present

## 2021-03-20 NOTE — Progress Notes (Signed)
Primary Physician/Referring:  Ginger Organ., MD  Patient ID: Dalton Mitchell, male    DOB: [birth year], 75 y.o.   MRN: [id number]  Chief Complaint  Patient presents with   Follow-up   Coronary Artery Disease   Hypertension   Hyperlipidemia   Shortness of Breath    6 month   HPI:    Dalton Mitchell  is a 75 y.o. 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 Brett Fairy presents here for annual visit.   Patient was admitted to the hospital in [place], [place] on 02/24/2021 with acute cholecystitis.  Underwent ERCP and also cholecystectomy.  He has recuperated well.  States that since last office visit he has not had any further fatigue.  States that he is presently asymptomatic and has been fairly active.  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     Past Medical History:  Diagnosis Date   Adenomatous colon polyp    Allergy    seasonal   Arthritis    CAD    Prior inferior MI treated with stent 1995     Chronic kidney disease, stage III (GFR 30-59 ml/min)    Depression    past in early 90s   GERD 02/24/2008   Gout    History of kidney stones    Hyperlipidemia    Hypertensive heart disease    Hypothyroidism    Obesity (BMI 30-39.9)    Old inferior wall myocardial infarction    Sleep apnea    USES CPAP OCCASIONALLY   Past Surgical History:  Procedure Laterality Date   COLONOSCOPY  12/25/2014   [place] previously   [place]   CORONARY ARTERY BYPASS GRAFT N/A 09/17/2013   Procedure: CORONARY ARTERY BYPASS GRAFTING (CABG);  Surgeon: Melrose Nakayama, MD;  Location: [facility];  Service: Open Heart Surgery;   Laterality: N/A;   CYSTOSCOPY WITH RETROGRADE PYELOGRAM, URETEROSCOPY AND STENT PLACEMENT Right 01/11/2014   Procedure: CYSTOSCOPY WITH RETROGRADE PYELOGRAM, URETEROSCOPY AND STENT PLACEMENT;  Surgeon: Molli Hazard, MD;  [facility];  Service: Urology;  Laterality: Right;   CYSTOSCOPY WITH RETROGRADE PYELOGRAM, URETEROSCOPY AND STENT PLACEMENT Right 02/01/2014   Procedure: CYSTOSCOPY WITH RETROGRADE PYELOGRAM, URETEROSCOPY AND STENT EXCHANGE;  Surgeon: Molli Hazard, MD;  [facility];  Service: Urology;  Laterality: Right;   EXPLORATION POST OPERATIVE OPEN HEART N/A 09/17/2013   Procedure: EXPLORATION POST OPERATIVE OPEN HEART;  Surgeon: Melrose Nakayama, MD;  Location: [facility];  Service: Open Heart Surgery;  Laterality: N/A;   HOLMIUM LASER APPLICATION Right 06/12/3569   Procedure: HOLMIUM LASER APPLICATION;  Surgeon: Molli Hazard, MD;  [facility];  Service: Urology;  Laterality: Right;   LEFT HEART CATHETERIZATION WITH CORONARY ANGIOGRAM N/A 09/14/2013   Procedure: LEFT HEART CATHETERIZATION WITH CORONARY ANGIOGRAM;  Surgeon: Jacolyn Reedy, MD;  Location: [facility] C[facility];  Service: Cardiovascular;  Laterality: N/A;   POLYPECTOMY     Family History  Problem Relation Age of Onset   Colon cancer Maternal Grandmother    Colon polyps Maternal Grandmother    Emphysema Mother    Esophageal cancer Neg Hx    Rectal cancer Neg  Hx    Stomach cancer Neg Hx     Social History   Tobacco Use   Smoking status: Former    Pack years: 0.00    Types: Cigarettes    Quit date: 1970    Years since quitting: 52.4   Smokeless tobacco: Never   Tobacco comments:    Quit 1971  Substance Use Topics   Alcohol use: No   Marital Status: Married  ROS  Review of Systems  Constitutional: Negative for malaise/fatigue.  Cardiovascular:  Negative for dyspnea on exertion and leg swelling.  Respiratory:  Positive for snoring (not compliant with CPAP).   Gastrointestinal:   Negative for melena.  Psychiatric/Behavioral:  The patient has insomnia. The patient is not nervous/anxious.   Objective  Blood pressure (!) 126/96, pulse 73, temperature (!) 97.2 F (36.2 C), resp. rate 17, height 5' 9"  (1.753 m), weight 233 lb (105.7 kg), SpO2 96 %.  Vitals with BMI 03/20/2021 09/18/2020 08/20/2020  Height 5' 9"  5' 9"  5' 9"   Weight 233 lbs 246 lbs 13 oz 247 lbs 8 oz  BMI 34.39 78.58 85.02  Systolic 774 128 786  Diastolic 96 86 82  Pulse 73 79 76     Physical Exam Constitutional:      Appearance: He is obese.     Comments: He is well-developed, moderately obese in no acute distress.  Neck:     Vascular: No carotid bruit or JVD.  Cardiovascular:     Rate and Rhythm: Normal rate and regular rhythm.     Pulses: Intact distal pulses.     Heart sounds: Normal heart sounds. No murmur heard.   No gallop.  Pulmonary:     Effort: Pulmonary effort is normal.     Breath sounds: Normal breath sounds.  Abdominal:     General: Bowel sounds are normal.     Palpations: Abdomen is soft.  Musculoskeletal:        General: Normal range of motion.     Right lower leg: No edema.  Neurological:     Mental Status: He is alert.   Laboratory examination:   Recent Labs    08/20/20 1445  NA 139  K 4.3  CL 105  CO2 27  GLUCOSE 92  BUN 33*  CREATININE 2.02*  CALCIUM 9.9   CrCl cannot be calculated (Patient's most recent lab result is older than the maximum 21 days allowed.).  CMP Latest Ref Rng & Units 08/20/2020 02/01/2014 01/25/2014  Glucose 70 - 99 mg/dL 92 91 98  BUN 6 - 23 mg/dL 33(H) - 38(H)  Creatinine 0.40 - 1.50 mg/dL 2.02(H) - 2.51(H)  Sodium 135 - 145 mEq/L 139 142 140  Potassium 3.5 - 5.1 mEq/L 4.3 4.3 5.4(H)  Chloride 96 - 112 mEq/L 105 - 103  CO2 19 - 32 mEq/L 27 - 25  Calcium 8.4 - 10.5 mg/dL 9.9 - 10.5  Total Protein 6.0 - 8.3 g/dL - - -  Total Bilirubin 0.3 - 1.2 mg/dL - - -  Alkaline Phos 39 - 117 U/L - - -  AST 0 - 37 U/L - - -  ALT 0 - 53 U/L - - -    CBC Latest Ref Rng & Units 02/01/2014 01/25/2014 01/10/2014  WBC 4.0 - 10.5 K/uL - 6.1 8.7  Hemoglobin 13.0 - 17.0 g/dL 14.6 13.1 13.6  Hematocrit 39.0 - 52.0 % 43.0 42.0 42.2  Platelets 150 - 400 K/uL - 235 223   Lipid Panel  No results  found for: CHOL, TRIG, HDL, CHOLHDL, VLDL, LDLCALC, LDLDIRECT HEMOGLOBIN A1C Lab Results  Component Value Date   HGBA1C 5.8 (H) 09/21/2013   MPG 120 (H) 09/21/2013    Lipid Panel  No results found for: CHOL, TRIG, HDL, CHOLHDL, VLDL, LDLCALC, LDLDIRECT, LABVLDL   External labs:   Labs 03/02/2021:  BUN 20, creatinine 1.76, EGFR 37 mL, potassium 4.1.  ALT 10/04/1996, AST 100.  Serum bilirubin 1.0.  A1c 6.9%, TSH was normal.  Hb 14.1/HCT 42.3, platelets 162.  Cholesterol, total 125.000 m 03/25/2020 HDL 38 MG/DL 03/25/2020 LDL 63.000 mg 03/25/2020 Triglycerides 122.000 03/25/2020  A1C 5.700 % 03/25/2020 TSH 1.730 05/05/2020  Hemoglobin 15.900 G/ 06/17/2020 Creatinine, Serum 2.020 mg/ 08/20/2020 Potassium 4.300 mEq 08/20/2020 ALT (SGPT) 24.000 uni 03/25/2020    Cholesterol, total 133.000 m 02/22/2019 HDL 36 MG/DL 02/22/2019 LDL 73.000 mg 02/22/2019 Triglycerides 122.000 02/22/2019  A1C 5.800 % 02/22/2019  Hemoglobin 15.000 g/ 02/22/2019  Creatinine, Serum 1.900 mg/ 02/22/2019 Potassium 4.900 mEq 04/19/2017 ALT (SGPT) 21.000 uni 02/22/2019 TSH 1.020 02/22/2019  Medications and allergies   Allergies  Allergen Reactions   Morphine And Related Other (See Comments)    STATES HORRIBLE HALLUCINATIONS.   Ace Inhibitors Cough   Lisinopril Other (See Comments) and Cough    Current Outpatient Medications on File Prior to Visit  Medication Sig Dispense Refill   allopurinol (ZYLOPRIM) 300 MG tablet Take 300 mg by mouth at bedtime.     aspirin EC 325 MG tablet Take 325 mg by mouth at bedtime.     cetirizine (ZYRTEC) 10 MG tablet Take 10 mg by mouth daily.     Coenzyme Q10 (COQ-10 PO) Take 100 mg by mouth daily.     escitalopram (LEXAPRO) 10 MG tablet  Take by mouth.     ezetimibe (ZETIA) 10 MG tablet Take 10 mg by mouth daily.     famotidine (PEPCID) 20 MG tablet Take 20 mg by mouth daily as needed.      FLUTICASONE PROPIONATE, NASAL, NA Place 2 Inhalers into the nose daily.     levothyroxine (SYNTHROID, LEVOTHROID) 88 MCG tablet Take 88 mcg by mouth daily before breakfast.     losartan (COZAAR) 50 MG tablet Take 1 tablet by mouth daily.     metoprolol (LOPRESSOR) 50 MG tablet Take 50 mg by mouth 2 (two) times daily.     OVER THE COUNTER MEDICATION CBD ointment     oxyCODONE-acetaminophen (PERCOCET/ROXICET) 5-325 MG tablet Take 1 tablet by mouth as needed.     rosuvastatin (CRESTOR) 20 MG tablet Take 20 mg by mouth daily.     senna-docusate (SENOKOT S) 8.6-50 MG per tablet Take 1 tablet by mouth 2 (two) times daily. (Patient taking differently: Take 1 tablet by mouth as needed.) 60 tablet 0   zolpidem (AMBIEN) 10 MG tablet Take 1 tablet (10 mg total) by mouth at bedtime. 30 tablet 1   No current facility-administered medications on file prior to visit.    Radiology:    Cardiac Studies:   Echocardiogram 09/13/2013:   - Left ventricle: The cavity size was normal. Wall thickness  was increased in a pattern of mild LVH. Systolic function  was normal. The estimated ejection fraction was in the range of 50% to 55%. Wall motion was normal; there were no regional wall motion abnormalities. Doppler parameters are  consistent with abnormal left ventricular relaxation (grade 1 diastolic dysfunction).  - Left atrium: The atrium was mildly dilated.   Coronary angiogram 09/14/2013: Proximal LAD 80%, OM1  95% stenosis, proximal RCA subtotal stenosis.  CABG x 4 09/17/2013: Utilizing LIMA to LAD, SVG to OM2, and a sequential SVG to PDA and distal RCA.  Sleep Study 10/11/2016: 1. Mild Obstructive Sleep Apnea (OSA), mainly hypopnea. REM AHI was 32.2, making this apnea not suitable for mandibular advancement therapy.  2. Hypoxemia for 164 minutes.  3.  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 4. Snoring   Lexiscan (Walking with mod Bruce)Tetrofosmin Stress Test  02/06/2020: Nondiagnostic ECG stress. Mild degree medium extent perfusion defect consistent with mild (reversible) ischemia located in the mid inferolateral wall and basal inferolateral wall  (Left Circumflex Artery region) of left ventricle. Overall LV systolic function is normal without regional wall motion abnormalities. Stress LV EF: 63%.  No previous exam available for comparison. Low risk.   EKG  EKG 03/20/2021: Sinus rhythm at first-degree block at rate of 74 bpm, left axis deviation, left anterior fascicular block.  No evidence of ischemia, normal QT interval.  No significant change from 09/18/2020.   Assessment     ICD-10-CM   1. Coronary artery disease involving native coronary artery of native heart without angina pectoris  I25.10 EKG 12-Lead    PCV ECHOCARDIOGRAM COMPLETE    2. Dyspnea on exertion  R06.00     3. Hx of CABG  Z95.1     4. Stage 4 chronic kidney disease ([place])  N18.4     5. Hypercholesteremia  E78.00      No orders of the defined types were placed in this encounter.   Medications Discontinued During This Encounter  Medication Reason   ALPRAZolam Duanne Moron) 0.5 MG tablet Error    Recommendations:   BASSEL GASKILL  is a 75 y.o. 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 Brett Fairy presents here for annual visit.   Patient was admitted to the hospital in [place], [place] on 02/24/2021 with acute cholecystitis.  Underwent ERCP and also cholecystectomy.  He has recuperated well.  States that since last office visit he has not had any further fatigue.  States that he is presently asymptomatic  and has been fairly active.  Blood pressure is well controlled, external records reviewed, lipids in excellent control as well.  He has had no recurrence of angina pectoris or dyspnea on exertion and fatigue is resolved.  He has not had a baseline echocardiogram in several years, it was ordered today.  I reassured him to increase his physical activity, weight loss discussed, I will see him back on an annual basis.     Adrian Prows, MD, [facility] 03/22/2021, 10:09 AM Office: [phone] Pager: [phone].

## 2021-03-24 ENCOUNTER — Ambulatory Visit (HOSPITAL_COMMUNITY): Payer: PPO

## 2021-03-26 ENCOUNTER — Ambulatory Visit (HOSPITAL_COMMUNITY): Payer: PPO

## 2021-03-31 ENCOUNTER — Ambulatory Visit: Payer: PPO

## 2021-03-31 ENCOUNTER — Ambulatory Visit (HOSPITAL_COMMUNITY): Payer: PPO

## 2021-03-31 ENCOUNTER — Other Ambulatory Visit: Payer: Self-pay

## 2021-03-31 DIAGNOSIS — I2510 Atherosclerotic heart disease of native coronary artery without angina pectoris: Secondary | ICD-10-CM

## 2021-04-01 DIAGNOSIS — E039 Hypothyroidism, unspecified: Secondary | ICD-10-CM | POA: Diagnosis not present

## 2021-04-01 DIAGNOSIS — R7301 Impaired fasting glucose: Secondary | ICD-10-CM | POA: Diagnosis not present

## 2021-04-01 DIAGNOSIS — E785 Hyperlipidemia, unspecified: Secondary | ICD-10-CM | POA: Diagnosis not present

## 2021-04-01 DIAGNOSIS — M109 Gout, unspecified: Secondary | ICD-10-CM | POA: Diagnosis not present

## 2021-04-01 DIAGNOSIS — Z125 Encounter for screening for malignant neoplasm of prostate: Secondary | ICD-10-CM | POA: Diagnosis not present

## 2021-04-02 ENCOUNTER — Ambulatory Visit (HOSPITAL_COMMUNITY): Payer: PPO

## 2021-04-02 DIAGNOSIS — N184 Chronic kidney disease, stage 4 (severe): Secondary | ICD-10-CM | POA: Diagnosis not present

## 2021-04-02 DIAGNOSIS — I129 Hypertensive chronic kidney disease with stage 1 through stage 4 chronic kidney disease, or unspecified chronic kidney disease: Secondary | ICD-10-CM | POA: Diagnosis not present

## 2021-04-02 DIAGNOSIS — E039 Hypothyroidism, unspecified: Secondary | ICD-10-CM | POA: Diagnosis not present

## 2021-04-02 DIAGNOSIS — E785 Hyperlipidemia, unspecified: Secondary | ICD-10-CM | POA: Diagnosis not present

## 2021-04-07 ENCOUNTER — Ambulatory Visit (HOSPITAL_COMMUNITY): Payer: PPO

## 2021-04-08 DIAGNOSIS — Z1331 Encounter for screening for depression: Secondary | ICD-10-CM | POA: Diagnosis not present

## 2021-04-08 DIAGNOSIS — F1320 Sedative, hypnotic or anxiolytic dependence, uncomplicated: Secondary | ICD-10-CM | POA: Diagnosis not present

## 2021-04-08 DIAGNOSIS — M25561 Pain in right knee: Secondary | ICD-10-CM | POA: Diagnosis not present

## 2021-04-08 DIAGNOSIS — Z1339 Encounter for screening examination for other mental health and behavioral disorders: Secondary | ICD-10-CM | POA: Diagnosis not present

## 2021-04-08 DIAGNOSIS — I25810 Atherosclerosis of coronary artery bypass graft(s) without angina pectoris: Secondary | ICD-10-CM | POA: Diagnosis not present

## 2021-04-08 DIAGNOSIS — E039 Hypothyroidism, unspecified: Secondary | ICD-10-CM | POA: Diagnosis not present

## 2021-04-08 DIAGNOSIS — R7301 Impaired fasting glucose: Secondary | ICD-10-CM | POA: Diagnosis not present

## 2021-04-08 DIAGNOSIS — N184 Chronic kidney disease, stage 4 (severe): Secondary | ICD-10-CM | POA: Diagnosis not present

## 2021-04-08 DIAGNOSIS — M25562 Pain in left knee: Secondary | ICD-10-CM | POA: Diagnosis not present

## 2021-04-08 DIAGNOSIS — E785 Hyperlipidemia, unspecified: Secondary | ICD-10-CM | POA: Diagnosis not present

## 2021-04-08 DIAGNOSIS — F339 Major depressive disorder, recurrent, unspecified: Secondary | ICD-10-CM | POA: Diagnosis not present

## 2021-04-08 DIAGNOSIS — R972 Elevated prostate specific antigen [PSA]: Secondary | ICD-10-CM | POA: Diagnosis not present

## 2021-04-08 DIAGNOSIS — I129 Hypertensive chronic kidney disease with stage 1 through stage 4 chronic kidney disease, or unspecified chronic kidney disease: Secondary | ICD-10-CM | POA: Diagnosis not present

## 2021-04-08 DIAGNOSIS — Z0000 Encounter for general adult medical examination without abnormal findings: Secondary | ICD-10-CM | POA: Diagnosis not present

## 2021-04-08 NOTE — Progress Notes (Signed)
Echocardiogram 03/31/2021: Technically difficult study with suboptimal images. Left ventricle cavity is normal in size and wall thickness. Normal global wall motion. Normal LV systolic function with EF 61%. Doppler evidence of grade I (impaired) diastolic dysfunction, normal LAP. No significant valvular abnormality. IVC not seen. Compared to 09/13/2013, LVEF 50 to 55%.  I will discuss on office visit soon.

## 2021-04-08 NOTE — Progress Notes (Signed)
Please let the patient know that the echocardiogram reveals normal heart function, very stable even compared to 2014.

## 2021-04-09 ENCOUNTER — Ambulatory Visit (HOSPITAL_COMMUNITY): Payer: PPO

## 2021-04-10 NOTE — Progress Notes (Signed)
Called patient, NA, LMAM

## 2021-04-10 NOTE — Progress Notes (Signed)
Patient called back, I have discussed results with him.

## 2021-04-14 ENCOUNTER — Ambulatory Visit (HOSPITAL_COMMUNITY): Payer: PPO

## 2021-04-16 ENCOUNTER — Ambulatory Visit (HOSPITAL_COMMUNITY): Payer: PPO

## 2021-05-03 DIAGNOSIS — E039 Hypothyroidism, unspecified: Secondary | ICD-10-CM | POA: Diagnosis not present

## 2021-05-03 DIAGNOSIS — N184 Chronic kidney disease, stage 4 (severe): Secondary | ICD-10-CM | POA: Diagnosis not present

## 2021-05-03 DIAGNOSIS — E785 Hyperlipidemia, unspecified: Secondary | ICD-10-CM | POA: Diagnosis not present

## 2021-05-03 DIAGNOSIS — I129 Hypertensive chronic kidney disease with stage 1 through stage 4 chronic kidney disease, or unspecified chronic kidney disease: Secondary | ICD-10-CM | POA: Diagnosis not present

## 2021-05-04 DIAGNOSIS — G4733 Obstructive sleep apnea (adult) (pediatric): Secondary | ICD-10-CM | POA: Diagnosis not present

## 2021-06-10 ENCOUNTER — Other Ambulatory Visit: Payer: Self-pay

## 2021-06-10 ENCOUNTER — Ambulatory Visit: Payer: PPO | Admitting: Neurology

## 2021-06-10 ENCOUNTER — Encounter: Payer: Self-pay | Admitting: Neurology

## 2021-06-10 VITALS — BP 120/82 | HR 87 | Ht 69.0 in | Wt 245.0 lb

## 2021-06-10 DIAGNOSIS — G4734 Idiopathic sleep related nonobstructive alveolar hypoventilation: Secondary | ICD-10-CM

## 2021-06-10 DIAGNOSIS — G4719 Other hypersomnia: Secondary | ICD-10-CM | POA: Diagnosis not present

## 2021-06-10 DIAGNOSIS — Z9989 Dependence on other enabling machines and devices: Secondary | ICD-10-CM | POA: Diagnosis not present

## 2021-06-10 DIAGNOSIS — G4733 Obstructive sleep apnea (adult) (pediatric): Secondary | ICD-10-CM

## 2021-06-10 DIAGNOSIS — I110 Hypertensive heart disease with heart failure: Secondary | ICD-10-CM

## 2021-06-10 DIAGNOSIS — N184 Chronic kidney disease, stage 4 (severe): Secondary | ICD-10-CM | POA: Diagnosis not present

## 2021-06-10 NOTE — Patient Instructions (Signed)

## 2021-06-10 NOTE — Progress Notes (Signed)
SLEEP MEDICINE CLINIC    Provider:  Larey Seat, MD  Primary Care Physician:  Ginger Organ., MD [address] [place] [address]     Referring Provider: Dr Einar Gip, MD         Chief Complaint according to patient   Patient presents with:     New Patient (Initial Visit)     presents today to addres if pressures are set where need to be. DME [employer], machine set up 11/30/16      HISTORY OF PRESENT ILLNESS:  Dalton Mitchell is a 75  Year-old Caucasian male patient seen here on 06/10/2021. the patient is well known to [place] and our sleep clinic. Here for yearly CPAP f/u. Pt reports his cpap power cord burned out and has not been able to use it.   Chief concern according to patient :  I am seeing Dalton Mitchell today , 06-10-2021, in a sleep clinic yearly follow up visit. He is in good mood,and talking fluently and without SOB. Travelled to [place] in summer of this year and developed some abdominal pain, he ws driving with his wife who convinced him to present to a local hospital,  He was hospitalized in June 22 in [place] , [place], had developed a gangrenous gallbladder, had robotic surgery. Chronic kidney disease, cardiac history, OSA with hypoxia.  He has w questions about INSPIRE device but he is not the right type of apnea ( hypoxia) and he has been too heavy.  His PAP machine recently puffed and smoked - he needs a new machine.   Last sleep study was just in 2021, his machine was reset , not replaced following this study,  Dr Rosette Reveal ordered the study. I reset to 7 through 16 cm water pressure. The machine 'died' in 04/20/21. He has reached an AHI of 1.2/h but often remove the mask early, after 2-3 hours.  He needed 4 cm water pressure to hit the sweet spot.  ----    I am seeing Dalton Mitchell today, a  right -handed White or Caucasian male with a known sleep disorder.  He  has a past medical history of Adenomatous colon polyp, Allergy,  Arthritis, CAD, Chronic kidney disease, stage III (GFR 30-59 ml/min), Depression, GERD (02/24/2008), Gout, History of kidney stones, Hyperlipidemia, Hypertensive heart disease, Hypothyroidism, Obesity (BMI 30-39.9), Old inferior wall myocardial infarction, and Sleep apnea.  Dalton Mitchell is seen here on 10 June 2020 to follow-up on his most recent sleep study which took place on 15 June the patient's current CPAP machine could be set to auto titration mode    I have the pressure between 7 and 16 cmH2O and 1 cm EPR under the use of a ResMed F 20 large size fullface mask.  He did respond positively to 14 cmH2O pressure his previous baseline sleep study followed in over no was prolonged.  Of hypoxia while on CPAP.  AHI resolved to 0.0/h under 14 cmH2O pressure sleep efficiency was 86%, and he is now meeting to see if he could adhere to this regimen.  He has used the machine 24 out of 30 days, average use at time is 4 hours 55 minutes, 69% he is using the machine over 4 hours but there were 4 days where he was under the 4-hour mark.  His residual AHI is excellent 2.2/h which is a significant reduction to baseline he does not have a significant central apnea problem with in  his upper residual apneas, and his 95th percentile pressure was 13 cmH2O 95th percentile air leakage was at 33 L/min and this may be further reducible. Currently using an Airfit F10 with magnetic snaps.    The patient had the first sleep study in January of the year 2018: upon Dr Irven Shelling referral .    Dalton Mitchell had his last sleep studies results in January 2018 on the eighth after being referred from Dr. Brigitte Pulse his primary care physician he was diagnosed with a rather mild degree of apnea with an AHI of 11.6, REM AHI was 32.2 making the use of positive airway pressure necessary.  Also the patient had significant oxygen desaturation during the sleep study he had 164 minutes with an oxygen saturation below 9089%.  He had also a lot of limb  movements at night so we asked him to come back for a CPAP titration on 31 January his apnea was significantly reduced but we noticed cardiac arrhythmia, he is still had few PLM's at the beginning of the titration but his limb movements were much alleviated when he reached his final titration of 10 cmH2O with an AHI of 0.0 and his machine was prescribed as an AutoSet with a pressure pressure range between 7 and 12 cmH2O 1 cm expiratory pressure relief.  He has been 83% compliant using the machine 25 out of 30 days with an average on days used of 5 hours 28 minutes.  He reached an AHI residual of 1.8/h which is a good resolution.  There were no central apneas noted his 95th percentile pressure however is increased to 11.4 cmH2O which would correspond with the highest amount of pressure allowed in his pressure window equal to 12 cm water.  He does have a lot of air leakage and I think we have to invest time in finding the best possible sealing mask for him.  I also would be happy to set up his pressure to a maximum of 13 rather with more expiratory pressure relief.  Dr. Einar Gip mentioned that the patient was markedly more fatigued had dyspnea no energy and did not feel overall very well.  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  Current GFR is under 30 mL/min recent laboratories were quoted below,.     Social history: Patient is working as a Development worker, international aid and lives in a household with his spouse, he has a large family.  Family status is married , with 7 living  children, many grandchildren.  One son committed suicide. 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 . Pets are present. 1 cat.  Tobacco use- quit 1970.  ETOH use none , Caffeine intake in form of Coffee (none ) but rarely drinking sodas. Hobbies :gardening    Sleep habits are as follows:  The patient's dinner time is between 5-6 PM. The patient goes to bed at 10-11 PM but he often can't sleep-  and continues to sleep for 3-4 hours, he wakes rarely for bathroom breaks, wakes often spontaneously at 3-4  AM.   The preferred sleep position is supine , with the support of 1-2 pillows. Dreams are reportedly  frequent/vivid.  5-6AM is the usual rise time. He reports not feeling refreshed or restored in AM, with symptoms such as dry mouth, but no morning headaches, and always residual fatigue. Naps  are taken frequently, lasting from 30-60 minutes and are more refreshing than nocturnal sleep.    Review of Systems: Out of a complete 14 system review, the patient complains of only the following symptoms, and all other reviewed systems are negative.:  Fatigue, sleepiness , snoring, fragmented sleep, Insomnia -    How likely are you to doze in the following situations: 0 = not likely, 1 = slight chance, 2 = moderate chance, 3 = high chance   Sitting and Reading? Watching Television? Sitting inactive in a public place (theater or meeting)? As a passenger in a car for an hour without a break? Lying down in the afternoon when circumstances permit? Sitting and talking to someone? Sitting quietly after lunch without alcohol? In a car, while stopped for a few minutes in traffic?   Total = 8/ 24 points   FSS endorsed at 60/ 63 points. severe , Depression : 9/15   Social History   Socioeconomic History   Marital status: Married    Spouse name: Not on file   Number of children: 8   Years of education: college   Highest education level: Not on file  Occupational History   Occupation: Retired    Occupation: [employer]   Tobacco Use   Smoking status: Former    Types: Cigarettes    Quit date: 1970    Years since quitting: 52.7   Smokeless tobacco: Never   Tobacco comments:    Quit Engineer, civil (consulting) Use: Never used  Substance and Sexual Activity   Alcohol use: No   Drug use:  Never   Sexual activity: Not Currently  Other Topics Concern   Not on file  Social History Narrative   Drinks about 1-2 caffeine beverages a day    Social Determinants of Radio broadcast assistant Strain: Not on file  Food Insecurity: Not on file  Transportation Needs: Not on file  Physical Activity: Not on file  Stress: Not on file  Social Connections: Not on file    Family History  Problem Relation Age of Onset   Colon cancer Maternal Grandmother    Colon polyps Maternal Grandmother    Emphysema Mother    Esophageal cancer Neg Hx    Rectal cancer Neg Hx    Stomach cancer Neg Hx     Past Medical History:  Diagnosis Date   Adenomatous colon polyp    Allergy    seasonal   Arthritis    CAD    Prior inferior MI treated with stent 1995     Chronic kidney disease, stage III (GFR 30-59 ml/min)    Depression    past in early 90s   GERD 02/24/2008   Gout    History of kidney stones    Hyperlipidemia    Hypertensive heart disease    Hypothyroidism    Obesity (BMI 30-39.9)    Old inferior wall myocardial infarction    Sleep apnea    USES CPAP OCCASIONALLY    Past Surgical History:  Procedure Laterality Date   COLONOSCOPY  12/25/2014   [facility] previously   [facility]   CORONARY ARTERY BYPASS GRAFT N/A 09/17/2013   Procedure: CORONARY ARTERY BYPASS GRAFTING (CABG);  Surgeon: Melrose Nakayama, MD;  Location: [facility];  Service: Open Heart Surgery;  Laterality: N/A;   CYSTOSCOPY WITH RETROGRADE PYELOGRAM, URETEROSCOPY AND STENT PLACEMENT Right 01/11/2014   Procedure: CYSTOSCOPY WITH  RETROGRADE PYELOGRAM, URETEROSCOPY AND STENT PLACEMENT;  Surgeon: Molli Hazard, MD;  Location: [facility];  Service: Urology;  Laterality: Right;   CYSTOSCOPY WITH RETROGRADE PYELOGRAM, URETEROSCOPY AND STENT PLACEMENT Right 02/01/2014   Procedure: CYSTOSCOPY WITH RETROGRADE PYELOGRAM, URETEROSCOPY AND STENT EXCHANGE;   Surgeon: Molli Hazard, MD;  Location: [facility];  Service: Urology;  Laterality: Right;   EXPLORATION POST OPERATIVE OPEN HEART N/A 09/17/2013   Procedure: EXPLORATION POST OPERATIVE OPEN HEART;  Surgeon: Melrose Nakayama, MD;  Location: [facility];  Service: Open Heart Surgery;  Laterality: N/A;   HOLMIUM LASER APPLICATION Right 06/10/262   Procedure: HOLMIUM LASER APPLICATION;  Surgeon: Molli Hazard, MD;  Location: [facility];  Service: Urology;  Laterality: Right;   LEFT HEART CATHETERIZATION WITH CORONARY ANGIOGRAM N/A 09/14/2013   Procedure: LEFT HEART CATHETERIZATION WITH CORONARY ANGIOGRAM;  Surgeon: Jacolyn Reedy, MD;  Location[facility];  Service: Cardiovascular;  Laterality: N/A;   POLYPECTOMY       Current Outpatient Medications on File Prior to Visit  Medication Sig Dispense Refill   allopurinol (ZYLOPRIM) 300 MG tablet Take 300 mg by mouth at bedtime.     aspirin EC 325 MG tablet Take 325 mg by mouth at bedtime.     cetirizine (ZYRTEC) 10 MG tablet Take 10 mg by mouth daily.     Coenzyme Q10 (COQ-10 PO) Take 100 mg by mouth daily.     escitalopram (LEXAPRO) 10 MG tablet Take by mouth.     ezetimibe (ZETIA) 10 MG tablet Take 10 mg by mouth daily.     famotidine (PEPCID) 20 MG tablet Take 20 mg by mouth daily as needed.      FLUTICASONE PROPIONATE, NASAL, NA Place 2 Inhalers into the nose daily.     levothyroxine (SYNTHROID, LEVOTHROID) 88 MCG tablet Take 88 mcg by mouth daily before breakfast.     losartan (COZAAR) 50 MG tablet Take 1 tablet by mouth daily.     metoprolol (LOPRESSOR) 50 MG tablet Take 50 mg by mouth 2 (two) times daily.     OVER THE COUNTER MEDICATION CBD ointment     oxyCODONE-acetaminophen (PERCOCET/ROXICET) 5-325 MG tablet Take 1 tablet by mouth as needed.     rosuvastatin (CRESTOR) 20 MG tablet Take 20 mg by mouth daily.     senna-docusate (SENOKOT S) 8.6-50 MG per tablet Take 1 tablet by mouth 2 (two) times daily. (Patient taking differently:  Take 1 tablet by mouth as needed.) 60 tablet 0   zolpidem (AMBIEN) 10 MG tablet Take 1 tablet (10 mg total) by mouth at bedtime. 30 tablet 1   No current facility-administered medications on file prior to visit.    Allergies  Allergen Reactions   Morphine And Related Other (See Comments)    STATES HORRIBLE HALLUCINATIONS.   Ace Inhibitors Cough   Lisinopril Other (See Comments) and Cough    Physical exam:  Today's Vitals   06/10/21 1433  BP: 120/82  Pulse: 87  Weight: 245 lb (111.1 kg)  Height: 5\' 9"  (1.753 m)   Body mass index is 36.18 kg/m.   Wt Readings from Last 3 Encounters:  06/10/21 245 lb (111.1 kg)  03/20/21 233 lb (105.7 kg)  09/18/20 246 lb 12.8 oz (111.9 kg)     Ht Readings from Last 3 Encounters:  06/10/21 5\' 9"  (1.753 m)  03/20/21 5\' 9"  (1.753 m)  09/18/20 5\' 9"  (1.753 m)      General: The patient is awake, alert and  appears not in acute distress. The patient is well groomed. Head: Normocephalic, atraumatic. Neck is supple. Mallampati 3,  neck circumference: 19 inches .  Dental status: no dentures.  Cardiovascular:  Regular rate and cardiac rhythm by pulse, without distended neck veins. Respiratory: Lungs are clear to auscultation, but restricted capacity.  Skin:  With evidence of ankle edema, or rash. Trunk: The patient's posture is erect. BMI 36.18.    Neurologic exam : The patient is awake and alert, oriented to place and time.   Memory subjective described as intact. He o holds a fluent conversation.  Attention span & concentration ability appears normal.  Speech is fluent,  without dysarthria, with mild dysphonia.  Mood and affect are appropriate.   Cranial nerves: no loss of smell or taste reported  Pupils are equal and briskly reactive to light. Funduscopic exam deferred. .  Extraocular movements in vertical and horizontal planes were intact and without nystagmus. No Diplopia. Visual fields by finger perimetry are intact. Hearing was  intact to soft voice.    Facial motor strength is symmetric and tongue and uvula move midline.  Neck ROM : rotation, tilt and flexion extension were normal for age and shoulder shrug was symmetrical.    Motor exam:  Symmetric bulk, tone and ROM.   Normal tone without cog-wheeling, symmetric grip strength . Sensory:  Fine touch  and vibration were not tested today.  Proprioception tested in the upper extremities was normal. Coordination: Rapid alternating movements in the fingers/hands were of normal speed.  The Finger-to-nose maneuver was intact without evidence of ataxia, dysmetria or tremor. Mild pronator drift.  Gait and station: Patient could rise unassisted from a seated position, walked without assistive device. He needs to brace himself to rise.  Stance is of normal width/ base .  Toe and heel walk were deferred.  Deep tendon reflexes: in the upper and lower extremities are symmetric and intact.  Babinski response was deferred.      After spending a total time of 25 minutes face to face and for physical and neurologic examination, review of laboratory studies,  personal review of imaging studies, reports and results of other testing and review of referral information / records as far as provided in visit, I have established the following assessments:  1)apnea - his PAP machine works well for his apnea - he may benefit from more pressure, but the residual AHI is low - air leaks remain but mask is comfortable, I will let it be. FFM. He needs a new machine.   2) depression, underlying- this contributes to fatigue.   3) his CKD grade 3 or 4, will certainly contribute to fatigue.    My Plan is to proceed with:  I will write for a HST and follow up with CPAP at set 14 cm water pressure.   In short, Dalton Mitchell is presenting with excessive fatigue , a symptom that can be attributed to many of his chronic conditions, but seem not related to apnea per se.    I plan to follow up  either personally or through our NP within 12 month.    Electronically signed by: Larey Seat, MD 06/10/2021 2:45 PM  [facility] Board certified by The [employer] of Sleep Medicine and Diplomate of the [employer] of Sleep Medicine. Board certified In Neurology through the [employer], Fellow of the [employer] of Neurology. Medical Director of [facility].

## 2021-06-10 NOTE — Progress Notes (Signed)
CM sent to [facility] 

## 2021-06-11 ENCOUNTER — Telehealth: Payer: Self-pay

## 2021-06-11 NOTE — Telephone Encounter (Signed)
FYI- order was placed my Dr. Brett Fairy for pt to get a new machine. Per DME Marchelle Gearing pt "Patient is not eligible for a new machine until 11/30/2021"  I informed her pt power cord burned out and was the reason for the request. Per Caryl Pina pt can just by a power cord, she will be calling the pt and making pt aware of this.

## 2021-06-20 ENCOUNTER — Emergency Department (HOSPITAL_COMMUNITY): Payer: PPO

## 2021-06-20 ENCOUNTER — Other Ambulatory Visit: Payer: Self-pay

## 2021-06-20 ENCOUNTER — Emergency Department (HOSPITAL_COMMUNITY)
Admission: EM | Admit: 2021-06-20 | Discharge: 2021-06-20 | Disposition: A | Discharge status: Home / Self Care | Payer: PPO | Attending: Emergency Medicine | Admitting: Emergency Medicine

## 2021-06-20 ENCOUNTER — Encounter (HOSPITAL_COMMUNITY): Payer: Self-pay | Admitting: Emergency Medicine

## 2021-06-20 DIAGNOSIS — R251 Tremor, unspecified: Secondary | ICD-10-CM | POA: Diagnosis not present

## 2021-06-20 DIAGNOSIS — R531 Weakness: Secondary | ICD-10-CM | POA: Insufficient documentation

## 2021-06-20 DIAGNOSIS — Z20822 Contact with and (suspected) exposure to covid-19: Secondary | ICD-10-CM | POA: Insufficient documentation

## 2021-06-20 DIAGNOSIS — R059 Cough, unspecified: Secondary | ICD-10-CM | POA: Insufficient documentation

## 2021-06-20 DIAGNOSIS — R350 Frequency of micturition: Secondary | ICD-10-CM | POA: Insufficient documentation

## 2021-06-20 DIAGNOSIS — E039 Hypothyroidism, unspecified: Secondary | ICD-10-CM | POA: Diagnosis not present

## 2021-06-20 DIAGNOSIS — I129 Hypertensive chronic kidney disease with stage 1 through stage 4 chronic kidney disease, or unspecified chronic kidney disease: Secondary | ICD-10-CM | POA: Insufficient documentation

## 2021-06-20 DIAGNOSIS — I2510 Atherosclerotic heart disease of native coronary artery without angina pectoris: Secondary | ICD-10-CM | POA: Diagnosis not present

## 2021-06-20 DIAGNOSIS — Z87891 Personal history of nicotine dependence: Secondary | ICD-10-CM | POA: Insufficient documentation

## 2021-06-20 DIAGNOSIS — N184 Chronic kidney disease, stage 4 (severe): Secondary | ICD-10-CM | POA: Insufficient documentation

## 2021-06-20 DIAGNOSIS — K219 Gastro-esophageal reflux disease without esophagitis: Secondary | ICD-10-CM | POA: Insufficient documentation

## 2021-06-20 DIAGNOSIS — Z955 Presence of coronary angioplasty implant and graft: Secondary | ICD-10-CM | POA: Insufficient documentation

## 2021-06-20 DIAGNOSIS — R1013 Epigastric pain: Secondary | ICD-10-CM | POA: Diagnosis not present

## 2021-06-20 DIAGNOSIS — R000 Tachycardia, unspecified: Secondary | ICD-10-CM | POA: Diagnosis not present

## 2021-06-20 DIAGNOSIS — R110 Nausea: Secondary | ICD-10-CM | POA: Insufficient documentation

## 2021-06-20 DIAGNOSIS — R1110 Vomiting, unspecified: Secondary | ICD-10-CM | POA: Diagnosis not present

## 2021-06-20 DIAGNOSIS — Z7982 Long term (current) use of aspirin: Secondary | ICD-10-CM | POA: Diagnosis not present

## 2021-06-20 DIAGNOSIS — Z79899 Other long term (current) drug therapy: Secondary | ICD-10-CM | POA: Insufficient documentation

## 2021-06-20 DIAGNOSIS — R5383 Other fatigue: Secondary | ICD-10-CM | POA: Diagnosis not present

## 2021-06-20 DIAGNOSIS — I700 Atherosclerosis of aorta: Secondary | ICD-10-CM | POA: Diagnosis not present

## 2021-06-20 LAB — RESP PANEL BY RT-PCR (FLU A&B, COVID) ARPGX2
Influenza A by PCR: NEGATIVE
Influenza B by PCR: NEGATIVE
SARS Coronavirus 2 by RT PCR: NEGATIVE

## 2021-06-20 LAB — URINALYSIS, ROUTINE W REFLEX MICROSCOPIC
Bacteria, UA: NONE SEEN
Bilirubin Urine: NEGATIVE
Glucose, UA: NEGATIVE mg/dL
Ketones, ur: 5 mg/dL — AB
Leukocytes,Ua: NEGATIVE
Nitrite: NEGATIVE
Protein, ur: >=300 mg/dL — AB
Specific Gravity, Urine: 1.014 (ref 1.005–1.030)
pH: 5 (ref 5.0–8.0)

## 2021-06-20 LAB — CBC WITH DIFFERENTIAL/PLATELET
Abs Immature Granulocytes: 0.03 10*3/uL (ref 0.00–0.07)
Basophils Absolute: 0 10*3/uL (ref 0.0–0.1)
Basophils Relative: 0 %
Eosinophils Absolute: 0 10*3/uL (ref 0.0–0.5)
Eosinophils Relative: 0 %
HCT: 52.5 % — ABNORMAL HIGH (ref 39.0–52.0)
Hemoglobin: 17.1 g/dL — ABNORMAL HIGH (ref 13.0–17.0)
Immature Granulocytes: 0 %
Lymphocytes Relative: 12 %
Lymphs Abs: 1 10*3/uL (ref 0.7–4.0)
MCH: 31.4 pg (ref 26.0–34.0)
MCHC: 32.6 g/dL (ref 30.0–36.0)
MCV: 96.5 fL (ref 80.0–100.0)
Monocytes Absolute: 0.6 10*3/uL (ref 0.1–1.0)
Monocytes Relative: 7 %
Neutro Abs: 6.8 10*3/uL (ref 1.7–7.7)
Neutrophils Relative %: 81 %
Platelets: 225 10*3/uL (ref 150–400)
RBC: 5.44 MIL/uL (ref 4.22–5.81)
RDW: 13.5 % (ref 11.5–15.5)
WBC: 8.5 10*3/uL (ref 4.0–10.5)
nRBC: 0 % (ref 0.0–0.2)

## 2021-06-20 LAB — COMPREHENSIVE METABOLIC PANEL
ALT: 27 U/L (ref 0–44)
AST: 33 U/L (ref 15–41)
Albumin: 4.4 g/dL (ref 3.5–5.0)
Alkaline Phosphatase: 57 U/L (ref 38–126)
Anion gap: 12 (ref 5–15)
BUN: 27 mg/dL — ABNORMAL HIGH (ref 8–23)
CO2: 25 mmol/L (ref 22–32)
Calcium: 11.9 mg/dL — ABNORMAL HIGH (ref 8.9–10.3)
Chloride: 99 mmol/L (ref 98–111)
Creatinine, Ser: 2.39 mg/dL — ABNORMAL HIGH (ref 0.61–1.24)
GFR, Estimated: 28 mL/min — ABNORMAL LOW (ref >60)
Glucose, Bld: 136 mg/dL — ABNORMAL HIGH (ref 70–99)
Potassium: 4 mmol/L (ref 3.5–5.1)
Sodium: 136 mmol/L (ref 135–145)
Total Bilirubin: 1 mg/dL (ref 0.3–1.2)
Total Protein: 8.1 g/dL (ref 6.5–8.1)

## 2021-06-20 LAB — TROPONIN I (HIGH SENSITIVITY)
Troponin I (High Sensitivity): 8 ng/L (ref <18)
Troponin I (High Sensitivity): 7 ng/L (ref <18)

## 2021-06-20 LAB — LIPASE, BLOOD: Lipase: 33 U/L (ref 11–51)

## 2021-06-20 LAB — TSH: TSH: 2.281 u[IU]/mL (ref 0.350–4.500)

## 2021-06-20 LAB — D-DIMER, QUANTITATIVE: D-Dimer, Quant: 2.13 ug/mL-FEU — ABNORMAL HIGH (ref 0.00–0.50)

## 2021-06-20 MED ORDER — SODIUM CHLORIDE 0.9 % IV BOLUS
1000.0000 mL | Freq: Once | INTRAVENOUS | Status: AC
  Administered 2021-06-20: 1000 mL via INTRAVENOUS

## 2021-06-20 NOTE — ED Triage Notes (Signed)
Pt c/o progressing fatigue x 1 mos. States today, while doing his normal outdoor work, he felt like he needed to lay down. Denies chest pain/shortness of breath, states he vomited x 2  today.

## 2021-06-20 NOTE — Discharge Instructions (Addendum)
You have worsening kidney function. You need to follow up with your kidney doctor within the next week if possible.

## 2021-06-20 NOTE — ED Provider Notes (Signed)
[facility] EMERGENCY DEPARTMENT Provider Note   CSN: [id number] Arrival date & time: 06/20/21  1727     History Chief Complaint  Patient presents with   Fatigue    Dalton Mitchell is a 75 y.o. male.  Past medical history of hypothyroidism, hyperlipidemia, hypertension, coronary artery disease, GERD, CKD stage IV, obesity, previous CABG and stents, unstable angina.  Comes to ED with progressive fatigue over the last few days his fatigue that has progressively worsened.  He has had worsening generalized weakness that has become severe.  He is unable to do a lot of the same activities that he normally does.  The weakness is constant.  1 side is not weaker than the other.  He also describes a dull mild abdominal discomfort in the epigastric area that occurred for a short period earlier today that was associated with nausea but no vomiting.  He has had increased heartburn over the past few days.  He also complains of trembling of his hands. He denies any melena but says his stools been more light-colored.  He has increased urination over the past few days.  He also has a worsening cough.  He denies any chest pain, shortness of breath, hemoptysis, melena, hematochezia, vomiting, fever.       HPI     Past Medical History:  Diagnosis Date   Adenomatous colon polyp    Allergy    seasonal   Arthritis    CAD    Prior inferior MI treated with stent 1995     Chronic kidney disease, stage III (GFR 30-59 ml/min)    Depression    past in early 90s   GERD 02/24/2008   Gout    History of kidney stones    Hyperlipidemia    Hypertensive heart disease    Hypothyroidism    Obesity (BMI 30-39.9)    Old inferior wall myocardial infarction    Sleep apnea    USES CPAP OCCASIONALLY    Patient Active Problem List   Diagnosis Date Noted   Excessive daytime sleepiness 03/28/2020   OSA on CPAP 03/28/2020   Mass of finger of right hand 12/02/2017   Pain in finger of right  hand 12/02/2017   Osteoarthritis of finger of right hand 12/02/2017   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   Unstable angina ([place]) 09/13/2013   Obesity (BMI 30-39.9)    Old inferior wall myocardial infarction    Sleep apnea    GERD 02/24/2008   Hypothyroidism    Hyperlipidemia    Gout    Hypertensive heart disease    CAD    Chronic kidney disease (CKD), stage IV (severe) ([place])    History of kidney stones     Past Surgical History:  Procedure Laterality Date   COLONOSCOPY  12/25/2014   [place] previously   Ong GRAFT N/A 09/17/2013   Procedure: CORONARY ARTERY BYPASS GRAFTING (CABG);  Surgeon: Melrose Nakayama, MD;  Location: [place];  Service: Open Heart Surgery;  Laterality: N/A;   CYSTOSCOPY WITH RETROGRADE PYELOGRAM, URETEROSCOPY AND STENT PLACEMENT Right 01/11/2014   Procedure: CYSTOSCOPY WITH RETROGRADE PYELOGRAM, URETEROSCOPY AND STENT PLACEMENT;  Surgeon: Molli Hazard, MD;  Location: [facility];  Service: Urology;  Laterality: Right;   CYSTOSCOPY WITH RETROGRADE PYELOGRAM, URETEROSCOPY AND STENT PLACEMENT Right 02/01/2014   Procedure: CYSTOSCOPY WITH RETROGRADE PYELOGRAM, URETEROSCOPY AND STENT EXCHANGE;  Surgeon: Quillian Quince  Julieanne Cotton, MD;  Location: [facility];  Service: Urology;  Laterality: Right;   EXPLORATION POST OPERATIVE OPEN HEART N/A 09/17/2013   Procedure: EXPLORATION POST OPERATIVE OPEN HEART;  Surgeon: Melrose Nakayama, MD;  Location: [place];  Service: Open Heart Surgery;  Laterality: N/A;   HOLMIUM LASER APPLICATION Right 8/0/2233   Procedure: HOLMIUM LASER APPLICATION;  Surgeon: Molli Hazard, MD;  Location: [facility];  Service: Urology;  Laterality: Right;   LEFT HEART CATHETERIZATION WITH CORONARY ANGIOGRAM N/A 09/14/2013   Procedure: LEFT HEART CATHETERIZATION WITH CORONARY ANGIOGRAM;  Surgeon: Jacolyn Reedy, MD;  Location: [facility] C[facility];  Service: Cardiovascular;   Laterality: N/A;   POLYPECTOMY         Family History  Problem Relation Age of Onset   Colon cancer Maternal Grandmother    Colon polyps Maternal Grandmother    Emphysema Mother    Esophageal cancer Neg Hx    Rectal cancer Neg Hx    Stomach cancer Neg Hx     Social History   Tobacco Use   Smoking status: Former    Types: Cigarettes    Quit date: 1970    Years since quitting: 52.7   Smokeless tobacco: Never   Tobacco comments:    Quit 1971  Vaping Use   Vaping Use: Never used  Substance Use Topics   Alcohol use: No   Drug use: Never    Home Medications Prior to Admission medications   Medication Sig Start Date End Date Taking? Authorizing Provider  allopurinol (ZYLOPRIM) 300 MG tablet Take 300 mg by mouth at bedtime.    [provider]  aspirin EC 325 MG tablet Take 325 mg by mouth at bedtime.    [provider]  cetirizine (ZYRTEC) 10 MG tablet Take 10 mg by mouth daily.    [provider]  Coenzyme Q10 (COQ-10 PO) Take 100 mg by mouth daily.    [provider]  escitalopram (LEXAPRO) 10 MG tablet Take by mouth. 08/07/20   [provider]  ezetimibe (ZETIA) 10 MG tablet Take 10 mg by mouth daily. 11/06/19   [provider]  famotidine (PEPCID) 20 MG tablet Take 20 mg by mouth daily as needed.     [provider]  FLUTICASONE PROPIONATE, NASAL, NA Place 2 Inhalers into the nose daily.    [provider]  levothyroxine (SYNTHROID, LEVOTHROID) 88 MCG tablet Take 88 mcg by mouth daily before breakfast.    [provider]  losartan (COZAAR) 50 MG tablet Take 1 tablet by mouth daily. 11/25/17   [provider]  metoprolol (LOPRESSOR) 50 MG tablet Take 50 mg by mouth 2 (two) times daily.    [provider]  OVER THE COUNTER MEDICATION CBD ointment    [provider]  oxyCODONE-acetaminophen (PERCOCET/ROXICET) 5-325 MG tablet Take 1 tablet by mouth as needed. 03/02/21    [provider]  rosuvastatin (CRESTOR) 20 MG tablet Take 20 mg by mouth daily. 11/05/19   [provider]  senna-docusate (SENOKOT S) 8.6-50 MG per tablet Take 1 tablet by mouth 2 (two) times daily. Patient taking differently: Take 1 tablet by mouth as needed. 01/11/14   Rolan Bucco, MD  zolpidem (AMBIEN) 10 MG tablet Take 1 tablet (10 mg total) by mouth at bedtime. 06/10/20   Dohmeier, Asencion Partridge, MD    Allergies    Morphine and related, Ace inhibitors, and Lisinopril  Review of Systems   Review of Systems  Constitutional:  Positive  for fatigue. Negative for chills and fever.  HENT:  Negative for congestion, rhinorrhea and sore throat.   Eyes:  Negative for visual disturbance.  Respiratory:  Positive for cough. Negative for chest tightness and shortness of breath.   Cardiovascular:  Negative for chest pain, palpitations and leg swelling.  Gastrointestinal:  Positive for abdominal pain and nausea. Negative for blood in stool, constipation, diarrhea and vomiting.  Genitourinary:  Negative for decreased urine volume, difficulty urinating, flank pain and hematuria.  Musculoskeletal:  Negative for back pain.  Skin:  Negative for rash and wound.  Neurological:  Positive for tremors. Negative for dizziness, syncope, weakness, light-headedness, numbness and headaches.  All other systems reviewed and are negative.  Physical Exam Updated Vital Signs BP (!) 147/97   Pulse 95   Temp 97.7 F (36.5 C)   Resp 13   Ht 5\' 9"  (1.753 m)   Wt 111.1 kg   SpO2 95%   BMI 36.18 kg/m   Physical Exam Vitals and nursing note reviewed.  Constitutional:      General: He is not in acute distress.    Appearance: Normal appearance. He is not ill-appearing, toxic-appearing or diaphoretic.  HENT:     Head: Normocephalic and atraumatic.     Mouth/Throat:     Mouth: Mucous membranes are dry.     Pharynx: Oropharynx is clear. No oropharyngeal exudate or posterior oropharyngeal erythema.   Eyes:     General: No scleral icterus.       Right eye: No discharge.        Left eye: No discharge.     Conjunctiva/sclera: Conjunctivae normal.     Pupils: Pupils are equal, round, and reactive to light.  Cardiovascular:     Rate and Rhythm: Normal rate and regular rhythm.     Pulses: Normal pulses.     Heart sounds: Normal heart sounds, S1 normal and S2 normal. No murmur heard.   No friction rub. No gallop.  Pulmonary:     Effort: Pulmonary effort is normal. No respiratory distress.     Breath sounds: Normal breath sounds. No wheezing, rhonchi or rales.  Abdominal:     General: Abdomen is flat. Bowel sounds are normal. There is no distension.     Palpations: Abdomen is soft. There is no pulsatile mass.     Tenderness: There is no abdominal tenderness. There is no guarding or rebound.  Musculoskeletal:     Right lower leg: No edema.     Left lower leg: No edema.  Skin:    General: Skin is warm and dry.     Coloration: Skin is not jaundiced.     Findings: No bruising, erythema, lesion or rash.  Neurological:     General: No focal deficit present.     Mental Status: He is alert and oriented to person, place, and time.     Cranial Nerves: No cranial nerve deficit.     Sensory: No sensory deficit.     Motor: No weakness.     Coordination: Coordination normal.  Psychiatric:        Mood and Affect: Mood normal.        Behavior: Behavior normal.    ED Results / Procedures / Treatments   Labs (all labs ordered are listed, but only abnormal results are displayed) Labs Reviewed  COMPREHENSIVE METABOLIC PANEL - Abnormal; Notable for the following components:      Result Value   Glucose, Bld 136 (*)    BUN  27 (*)    Creatinine, Ser 2.39 (*)    Calcium 11.9 (*)    GFR, Estimated 28 (*)    All other components within normal limits  CBC WITH DIFFERENTIAL/PLATELET - Abnormal; Notable for the following components:   Hemoglobin 17.1 (*)    HCT 52.5 (*)    All other components  within normal limits  URINALYSIS, ROUTINE W REFLEX MICROSCOPIC - Abnormal; Notable for the following components:   Hgb urine dipstick SMALL (*)    Ketones, ur 5 (*)    Protein, ur >=300 (*)    All other components within normal limits  D-DIMER, QUANTITATIVE (NOT AT [facility]) - Abnormal; Notable for the following components:   D-Dimer, Quant 2.13 (*)    All other components within normal limits  RESP PANEL BY RT-PCR (FLU A&B, COVID) ARPGX2  LIPASE, BLOOD  TSH  TROPONIN I (HIGH SENSITIVITY)  TROPONIN I (HIGH SENSITIVITY)    EKG None  Radiology DG Chest 2 View  Result Date: 06/20/2021 CLINICAL DATA:  Fatigue and vomiting. EXAM: CHEST - 2 VIEW COMPARISON:  October 16, 2013 FINDINGS: Multiple sternal wires and vascular clips are seen. There is no evidence of acute infiltrate, pleural effusion or pneumothorax. The heart size and mediastinal contours are within normal limits. There is moderate severity calcification of the aortic arch and tortuosity of the descending thoracic aorta. Degenerative changes are noted throughout the thoracic spine. IMPRESSION: 1. Evidence of prior median sternotomy/CABG. 2. No acute cardiopulmonary disease. Electronically Signed   By: Virgina Norfolk M.D.   On: 06/20/2021 19:00    Procedures Procedures   Medications Ordered in ED Medications  sodium chloride 0.9 % bolus 1,000 mL (0 mLs Intravenous Stopped 06/20/21 2118)    ED Course  I have reviewed the triage vital signs and the nursing notes.  Pertinent labs & imaging results that were available during my care of the patient were reviewed by me and considered in my medical decision making (see chart for details).    MDM Rules/Calculators/A&P                          This is a well-appearing 75 year old male presents emergency department for complaints of generalized weakness that has been worsening over the past few days.  On arrival patient was tachycardic.  Otherwise vital signs were stable and patient  is afebrile. Differential diagnosis for generalized weakness includes ACS, infection, anemia, hypothyroidism, AKI, electrolyte abnormality, hypoglycemia.   Due to patient's tachycardia and generalized weakness recently I originally obtained a D-dimer, however tachycardia improved with fluids.  D-dimer did come back positive, however I do not feel like patient has any evidence of pulmonary embolism. No syncope, dyspnea, leg swelling, or chest pain present. PERC criteria is now negative. Will likely hold off on CTA chest due to kidney function.  I discussed this extensively with Dr. Pearline Cables and she agrees on holding off on CTA chest.  Thus far work-up has revealed:  - CBC: No anemia or leukocytosis - CMP: Creatinine 2.39 which is slightly elevated above baseline. Mild AKI  GFR is 28.  Some hypercalcemia of 11.9.  Otherwise sodium and potassium are normal.  No hypo or hyper glycemia.  Liver enzymes are normal. - Lipase: negative - D-dimer: 2.13 --> after reevaluation, patient no longer meets PERC criteria.  D-dimer Positive likely due to other inflammatory cause.  Resolved patient remained stable, will hold off on CTA chest due to low GFR. - Troponin:  negative - TSH: Normal - COVID/Flu: Negative - UA: Elevated protein with small amt of ketones in urine. --> This is likely due to CKD stage 4 - CXR: No acute abnormality - EKG: Nonspecific conduction abnormality.  No notable change compared to previous EKG.  On Reevaluation, symptoms have improved after giving 1 L IV fluid bolus.  I do not feel that his symptoms are secondary to ACS, infection, hypothyroidism, endocrine abnormality, electrolyte abnormality, or anemia.  After Reviewing labs, patient does have progression of his CKD.  GFR is now below 30.  He needs to follow up with his nephrologist.  I believe he is stable to do so on an outpatient basis.  I discussed possible admission with patient however he feels okay to go home.  I think this is  reasonable at this time.  He is told to return to the emergency room if he develops any worsening symptoms.  This may indicate he has worsening kidney injury. I urged the importance of drinking plenty of fluids. I messaged his nephrologist Dr. Justin Mend a message asking to set him up with an appointment on outpatient basis.  Patient was told to call nephrology office on Monday to set up an appointment.       Final Clinical Impression(s) / ED Diagnoses Final diagnoses:  Generalized weakness    Rx / DC Orders ED Discharge Orders     None        Adolphus Birchwood, PA-C 58/59/29 [id number]    Campbell Stall P, DO 28/63/81 1140

## 2021-06-20 NOTE — ED Notes (Signed)
This RN called lab to check status of labs that are still in process from 630pm, lab states they are running them now

## 2021-06-20 NOTE — ED Provider Notes (Signed)
Emergency Medicine Provider Triage Evaluation Note  Dalton Mitchell , a 75 y.o. male  was evaluated in triage.  Pt complains of fatigue and generalized weakness.  States that over the last few days he has had increasing generalized fatigue and generalized weakness that is slowly worsening since it started.  He is unable to do a lot of the same activities that he normally does.  He has had a previous heart attack and this was not what his previous heart attack was like.  He has a dull mild abdominal discomfort in the epigastric area.  He has had increased heartburn.  He also states that he has had more trembling of his hands.  He admits to having a cough, however he had COVID several months ago and it is unclear whether this is a new cough or is the lengthy lingering cough that he had had before.  He does have some associated nausea with no nausea.  Denies any melena but does say stools been more like color.  He has increased urination.  PMH with extensive cardiac history.  He has multiple stents he has had a quad bypass.  He also had his gallbladder removed 3 months ago.  Review of Systems  Positive: Epigastric pain, fatigue, generalized weakness, cough, increased urination Negative: Chest pain, shortness of breath, hemoptysis  Physical Exam  BP (!) 141/100 (BP Location: Left Arm)   Pulse (!) 110   Temp 98.4 F (36.9 C)   Resp 16   SpO2 98%  Gen:   Awake, no distress Resp:  Normal effort.  Lungs CTA MSK:   Moves extremities without difficulty Other:  No significant abdominal tenderness to palpation, notable trembling of bilateral arms  Medical Decision Making  Medically screening exam initiated at 6:36 PM.  Appropriate orders placed.  Dalton Mitchell was informed that the remainder of the evaluation will be completed by another provider, this initial triage assessment does not replace that evaluation, and the importance of remaining in the ED until their evaluation is complete.    Dalton Birchwood, PA-C 37/48/27 0786    Dalton Cure, DO 75/44/92 1139

## 2021-06-26 DIAGNOSIS — E039 Hypothyroidism, unspecified: Secondary | ICD-10-CM | POA: Diagnosis not present

## 2021-06-26 DIAGNOSIS — N184 Chronic kidney disease, stage 4 (severe): Secondary | ICD-10-CM | POA: Diagnosis not present

## 2021-06-26 DIAGNOSIS — R5382 Chronic fatigue, unspecified: Secondary | ICD-10-CM | POA: Diagnosis not present

## 2021-06-26 DIAGNOSIS — F1320 Sedative, hypnotic or anxiolytic dependence, uncomplicated: Secondary | ICD-10-CM | POA: Diagnosis not present

## 2021-06-29 DIAGNOSIS — I129 Hypertensive chronic kidney disease with stage 1 through stage 4 chronic kidney disease, or unspecified chronic kidney disease: Secondary | ICD-10-CM | POA: Diagnosis not present

## 2021-07-03 DIAGNOSIS — E039 Hypothyroidism, unspecified: Secondary | ICD-10-CM | POA: Diagnosis not present

## 2021-07-03 DIAGNOSIS — E785 Hyperlipidemia, unspecified: Secondary | ICD-10-CM | POA: Diagnosis not present

## 2021-07-03 DIAGNOSIS — N184 Chronic kidney disease, stage 4 (severe): Secondary | ICD-10-CM | POA: Diagnosis not present

## 2021-07-03 DIAGNOSIS — I129 Hypertensive chronic kidney disease with stage 1 through stage 4 chronic kidney disease, or unspecified chronic kidney disease: Secondary | ICD-10-CM | POA: Diagnosis not present

## 2021-08-03 DIAGNOSIS — E785 Hyperlipidemia, unspecified: Secondary | ICD-10-CM | POA: Diagnosis not present

## 2021-08-03 DIAGNOSIS — N184 Chronic kidney disease, stage 4 (severe): Secondary | ICD-10-CM | POA: Diagnosis not present

## 2021-08-03 DIAGNOSIS — M1000 Idiopathic gout, unspecified site: Secondary | ICD-10-CM | POA: Diagnosis not present

## 2021-08-03 DIAGNOSIS — N189 Chronic kidney disease, unspecified: Secondary | ICD-10-CM | POA: Diagnosis not present

## 2021-08-03 DIAGNOSIS — N1832 Chronic kidney disease, stage 3b: Secondary | ICD-10-CM | POA: Diagnosis not present

## 2021-08-03 DIAGNOSIS — I129 Hypertensive chronic kidney disease with stage 1 through stage 4 chronic kidney disease, or unspecified chronic kidney disease: Secondary | ICD-10-CM | POA: Diagnosis not present

## 2021-08-03 DIAGNOSIS — N2581 Secondary hyperparathyroidism of renal origin: Secondary | ICD-10-CM | POA: Diagnosis not present

## 2021-08-03 DIAGNOSIS — I2510 Atherosclerotic heart disease of native coronary artery without angina pectoris: Secondary | ICD-10-CM | POA: Diagnosis not present

## 2021-08-03 DIAGNOSIS — E039 Hypothyroidism, unspecified: Secondary | ICD-10-CM | POA: Diagnosis not present

## 2021-10-02 DIAGNOSIS — E039 Hypothyroidism, unspecified: Secondary | ICD-10-CM | POA: Diagnosis not present

## 2021-10-02 DIAGNOSIS — N184 Chronic kidney disease, stage 4 (severe): Secondary | ICD-10-CM | POA: Diagnosis not present

## 2021-10-02 DIAGNOSIS — I10 Essential (primary) hypertension: Secondary | ICD-10-CM | POA: Diagnosis not present

## 2021-10-02 DIAGNOSIS — E785 Hyperlipidemia, unspecified: Secondary | ICD-10-CM | POA: Diagnosis not present

## 2021-10-06 ENCOUNTER — Telehealth: Payer: Self-pay | Admitting: Neurology

## 2021-10-06 NOTE — Telephone Encounter (Signed)
I have reached out to the DME company first to discuss pt getting a new machine first.

## 2021-10-06 NOTE — Telephone Encounter (Signed)
Pt would like a call from the nurse to discuss replacing CPAP machine.   Informed patient of previous note Per DME Marchelle Gearing pt "Patient is not eligible for a new machine until 11/30/2021"

## 2021-10-06 NOTE — Telephone Encounter (Signed)
Called the pt back. Patient states that he is still waiting on new machine and I advised that last we heard the DME company reached out to him to advise he would be eligbile in [place] for new machine. It was discussed the pt would purchase a power cord on from [place].  Pt has not taken the machine to the DME company. Advised that they should be responsible in helping with getting him set up with something to use or replace the part that needs replacing. Pt mentioned a friend had one that he could use and he was asking if that was possible. Advised that would be fine but he would need to take that to the DME company also for them to get it set up for his settings ordered and tag our office to that machine. Pt verbalized understanding. Pt will contact the company and I advised that I will see if someone will reach out to him too.

## 2021-10-14 ENCOUNTER — Ambulatory Visit: Payer: PPO | Admitting: Neurology

## 2021-10-24 ENCOUNTER — Emergency Department (HOSPITAL_COMMUNITY): Payer: PPO

## 2021-10-24 ENCOUNTER — Emergency Department (HOSPITAL_COMMUNITY)
Admission: EM | Admit: 2021-10-24 | Discharge: 2021-10-24 | Disposition: A | Discharge status: Home / Self Care | Payer: PPO | Attending: Emergency Medicine | Admitting: Emergency Medicine

## 2021-10-24 ENCOUNTER — Other Ambulatory Visit: Payer: Self-pay

## 2021-10-24 ENCOUNTER — Encounter (HOSPITAL_COMMUNITY): Payer: Self-pay | Admitting: Emergency Medicine

## 2021-10-24 DIAGNOSIS — H539 Unspecified visual disturbance: Secondary | ICD-10-CM

## 2021-10-24 DIAGNOSIS — R945 Abnormal results of liver function studies: Secondary | ICD-10-CM | POA: Diagnosis not present

## 2021-10-24 DIAGNOSIS — R1013 Epigastric pain: Secondary | ICD-10-CM | POA: Insufficient documentation

## 2021-10-24 DIAGNOSIS — R079 Chest pain, unspecified: Secondary | ICD-10-CM | POA: Diagnosis not present

## 2021-10-24 DIAGNOSIS — N189 Chronic kidney disease, unspecified: Secondary | ICD-10-CM | POA: Diagnosis not present

## 2021-10-24 DIAGNOSIS — R531 Weakness: Secondary | ICD-10-CM | POA: Insufficient documentation

## 2021-10-24 DIAGNOSIS — R7989 Other specified abnormal findings of blood chemistry: Secondary | ICD-10-CM | POA: Diagnosis not present

## 2021-10-24 DIAGNOSIS — Z7982 Long term (current) use of aspirin: Secondary | ICD-10-CM | POA: Insufficient documentation

## 2021-10-24 DIAGNOSIS — N281 Cyst of kidney, acquired: Secondary | ICD-10-CM | POA: Diagnosis not present

## 2021-10-24 DIAGNOSIS — Z79899 Other long term (current) drug therapy: Secondary | ICD-10-CM | POA: Insufficient documentation

## 2021-10-24 DIAGNOSIS — I700 Atherosclerosis of aorta: Secondary | ICD-10-CM | POA: Diagnosis not present

## 2021-10-24 DIAGNOSIS — R410 Disorientation, unspecified: Secondary | ICD-10-CM | POA: Diagnosis not present

## 2021-10-24 DIAGNOSIS — E039 Hypothyroidism, unspecified: Secondary | ICD-10-CM | POA: Insufficient documentation

## 2021-10-24 DIAGNOSIS — H538 Other visual disturbances: Secondary | ICD-10-CM | POA: Insufficient documentation

## 2021-10-24 DIAGNOSIS — R9431 Abnormal electrocardiogram [ECG] [EKG]: Secondary | ICD-10-CM | POA: Diagnosis not present

## 2021-10-24 LAB — LIPASE, BLOOD: Lipase: 48 U/L (ref 11–51)

## 2021-10-24 LAB — TSH: TSH: 4.757 u[IU]/mL — ABNORMAL HIGH (ref 0.350–4.500)

## 2021-10-24 LAB — COMPREHENSIVE METABOLIC PANEL
ALT: 279 U/L — ABNORMAL HIGH (ref 0–44)
AST: 147 U/L — ABNORMAL HIGH (ref 15–41)
Albumin: 3.5 g/dL (ref 3.5–5.0)
Alkaline Phosphatase: 145 U/L — ABNORMAL HIGH (ref 38–126)
Anion gap: 9 (ref 5–15)
BUN: 24 mg/dL — ABNORMAL HIGH (ref 8–23)
CO2: 23 mmol/L (ref 22–32)
Calcium: 9.9 mg/dL (ref 8.9–10.3)
Chloride: 103 mmol/L (ref 98–111)
Creatinine, Ser: 2.39 mg/dL — ABNORMAL HIGH (ref 0.61–1.24)
GFR, Estimated: 28 mL/min — ABNORMAL LOW (ref >60)
Glucose, Bld: 139 mg/dL — ABNORMAL HIGH (ref 70–99)
Potassium: 4.1 mmol/L (ref 3.5–5.1)
Sodium: 135 mmol/L (ref 135–145)
Total Bilirubin: 0.9 mg/dL (ref 0.3–1.2)
Total Protein: 7.4 g/dL (ref 6.5–8.1)

## 2021-10-24 LAB — CBC WITH DIFFERENTIAL/PLATELET
Abs Immature Granulocytes: 0.02 10*3/uL (ref 0.00–0.07)
Basophils Absolute: 0 10*3/uL (ref 0.0–0.1)
Basophils Relative: 1 %
Eosinophils Absolute: 0.3 10*3/uL (ref 0.0–0.5)
Eosinophils Relative: 5 %
HCT: 49.9 % (ref 39.0–52.0)
Hemoglobin: 16.2 g/dL (ref 13.0–17.0)
Immature Granulocytes: 0 %
Lymphocytes Relative: 15 %
Lymphs Abs: 0.9 10*3/uL (ref 0.7–4.0)
MCH: 31.5 pg (ref 26.0–34.0)
MCHC: 32.5 g/dL (ref 30.0–36.0)
MCV: 96.9 fL (ref 80.0–100.0)
Monocytes Absolute: 0.7 10*3/uL (ref 0.1–1.0)
Monocytes Relative: 12 %
Neutro Abs: 3.8 10*3/uL (ref 1.7–7.7)
Neutrophils Relative %: 67 %
Platelets: 184 10*3/uL (ref 150–400)
RBC: 5.15 MIL/uL (ref 4.22–5.81)
RDW: 13.4 % (ref 11.5–15.5)
WBC: 5.7 10*3/uL (ref 4.0–10.5)
nRBC: 0 % (ref 0.0–0.2)

## 2021-10-24 LAB — AMMONIA: Ammonia: 16 umol/L (ref 9–35)

## 2021-10-24 LAB — URINALYSIS, ROUTINE W REFLEX MICROSCOPIC
Bacteria, UA: NONE SEEN
Bilirubin Urine: NEGATIVE
Glucose, UA: NEGATIVE mg/dL
Ketones, ur: NEGATIVE mg/dL
Leukocytes,Ua: NEGATIVE
Nitrite: NEGATIVE
Protein, ur: 100 mg/dL — AB
Specific Gravity, Urine: 1.004 — ABNORMAL LOW (ref 1.005–1.030)
pH: 6 (ref 5.0–8.0)

## 2021-10-24 LAB — TROPONIN I (HIGH SENSITIVITY)
Troponin I (High Sensitivity): 11 ng/L (ref <18)
Troponin I (High Sensitivity): 9 ng/L (ref <18)

## 2021-10-24 LAB — ACETAMINOPHEN LEVEL: Acetaminophen (Tylenol), Serum: <10 ug/mL — ABNORMAL LOW (ref 10–30)

## 2021-10-24 LAB — T4, FREE: Free T4: 0.91 ng/dL (ref 0.61–1.12)

## 2021-10-24 NOTE — ED Provider Triage Note (Signed)
Emergency Medicine Provider Triage Evaluation Note  Dalton Mitchell , a 76 y.o. male  was evaluated in triage.  Pt complains of visual changes.  Patient states that about 1 day ago he experienced an episode of upper abdominal pain as well as chest pain when bending over.  He was able to induce vomiting which she states improved his symptoms.  Tonight while lying in bed he felt as if the ceiling was "moving or undulating" and his family brought him to the emergency department for further evaluation.  He currently denies any symptoms at this time.  Physical Exam  BP 131/85 (BP Location: Left Arm)    Pulse 63    Temp 97.6 F (36.4 C) (Oral)    Resp 19    Ht 5\' 9"  (1.753 m)    Wt 125 kg    SpO2 96%    BMI 40.70 kg/m  Gen:   Awake, no distress   Resp:  Normal effort  MSK:   Moves extremities without difficulty  Other:    Medical Decision Making  Medically screening exam initiated at 3:14 AM.  Appropriate orders placed.  Dalton Lucianne Lei was informed that the remainder of the evaluation will be completed by another provider, this initial triage assessment does not replace that evaluation, and the importance of remaining in the ED until their evaluation is complete.   Rayna Sexton, PA-C 10/24/21 [phone]

## 2021-10-24 NOTE — ED Provider Notes (Signed)
[facility] EMERGENCY DEPARTMENT Provider Note   CSN: [id number] Arrival date & time: 10/24/21  0255     History  Chief Complaint  Patient presents with   Confused     Dalton Mitchell is a 76 y.o. male.  HPI Patient presents with his wife.  Has had confusion and feeling bad.  Has been feeling generally weak for a few months now.  States he is put on weight because of it.  No chest pain.  No trouble breathing.  No dysuria.  No nausea or vomiting.  States when he looks in he will see some things where they seem to be moving around a little bit.  Has had less activity.  States that he has chronic kidney disease.  History of hypothyroidism.  States he did have severe abdominal pain earlier today.  States he bent over to the side and had severe pain that is bad as he felt in his left abdomen going to the right side.  That is since improved.  No diarrhea or constipation.States he will occasionally take some Tylenol to help with the pain.  Has not been taking more than 2 g a day however.  Does not drink alcohol. Patient's wife said that the patient was more confused.  Reportedly had seen someone in the room earlier that looks like a healed child.    Home Medications Prior to Admission medications   Medication Sig Start Date End Date Taking? Authorizing Provider  allopurinol (ZYLOPRIM) 300 MG tablet Take 300 mg by mouth at bedtime.    [provider]  aspirin EC 325 MG tablet Take 325 mg by mouth at bedtime.    [provider]  cetirizine (ZYRTEC) 10 MG tablet Take 10 mg by mouth daily.    [provider]  Coenzyme Q10 (COQ-10 PO) Take 100 mg by mouth daily.    [provider]  escitalopram (LEXAPRO) 10 MG tablet Take by mouth. 08/07/20   [provider]  ezetimibe (ZETIA) 10 MG tablet Take 10 mg by mouth daily. 11/06/19   [provider]  famotidine (PEPCID) 20 MG tablet Take 20 mg by mouth daily as needed.     [provider]  FLUTICASONE PROPIONATE, NASAL, NA Place 2 Inhalers into the nose daily.    [provider]  levothyroxine (SYNTHROID, LEVOTHROID) 88 MCG tablet Take 88 mcg by mouth daily before breakfast.    [provider]  losartan (COZAAR) 50 MG tablet Take 1 tablet by mouth daily. 11/25/17   [provider]  metoprolol (LOPRESSOR) 50 MG tablet Take 50 mg by mouth 2 (two) times daily.    [provider]  OVER THE COUNTER MEDICATION CBD ointment    [provider]  oxyCODONE-acetaminophen (PERCOCET/ROXICET) 5-325 MG tablet Take 1 tablet by mouth as needed. 03/02/21   [provider]  rosuvastatin (CRESTOR) 20 MG tablet Take 20 mg by mouth daily. 11/05/19   [provider]  senna-docusate (SENOKOT S) 8.6-50 MG per tablet Take 1 tablet by mouth 2 (two) times daily. Patient taking differently: Take 1 tablet by mouth as needed. 01/11/14   Rolan Bucco, MD  zolpidem (AMBIEN) 10 MG tablet Take 1 tablet (10 mg total) by mouth at bedtime. 06/10/20   Dohmeier, Asencion Partridge, MD      Allergies    Morphine and related, Ace inhibitors, and Lisinopril    Review of Systems   Review of Systems  Constitutional:  Negative for appetite change and  unexpected weight change.  HENT:  Negative for congestion.   Eyes:  Positive for visual disturbance.  Respiratory:  Negative for shortness of breath.   Cardiovascular:  Negative for chest pain.  Gastrointestinal:  Positive for abdominal pain.  Genitourinary:  Negative for flank pain.  Musculoskeletal:  Negative for back pain.  Skin:  Negative for wound.  Neurological:  Negative for weakness.  Psychiatric/Behavioral:  Positive for hallucinations.    Physical Exam Updated Vital Signs BP (!) 143/95    Pulse 60    Temp 97.6 F (36.4 C) (Oral)    Resp 18    Ht 5\' 9"  (1.753 m)    Wt 125 kg    SpO2 96%    BMI 40.70 kg/m  Physical Exam Vitals and nursing note reviewed.  HENT:     Head: Atraumatic.  Eyes:      Pupils: Pupils are equal, round, and reactive to light.  Cardiovascular:     Rate and Rhythm: Regular rhythm.  Abdominal:     Comments: Epigastric tenderness.  No rebound or guarding.  No hernia palpated.  Musculoskeletal:        General: No tenderness.  Skin:    General: Skin is warm.     Capillary Refill: Capillary refill takes less than 2 seconds.  Neurological:     Mental Status: He is alert and oriented to person, place, and time.    ED Results / Procedures / Treatments   Labs (all labs ordered are listed, but only abnormal results are displayed) Labs Reviewed  COMPREHENSIVE METABOLIC PANEL - Abnormal; Notable for the following components:      Result Value   Glucose, Bld 139 (*)    BUN 24 (*)    Creatinine, Ser 2.39 (*)    AST 147 (*)    ALT 279 (*)    Alkaline Phosphatase 145 (*)    GFR, Estimated 28 (*)    All other components within normal limits  TSH - Abnormal; Notable for the following components:   TSH 4.757 (*)    All other components within normal limits  ACETAMINOPHEN LEVEL - Abnormal; Notable for the following components:   Acetaminophen (Tylenol), Serum <10 (*)    All other components within normal limits  URINALYSIS, ROUTINE W REFLEX MICROSCOPIC - Abnormal; Notable for the following components:   Color, Urine STRAW (*)    Specific Gravity, Urine 1.004 (*)    Hgb urine dipstick SMALL (*)    Protein, ur 100 (*)    All other components within normal limits  CBC WITH DIFFERENTIAL/PLATELET  LIPASE, BLOOD  T4, FREE  AMMONIA  TROPONIN I (HIGH SENSITIVITY)  TROPONIN I (HIGH SENSITIVITY)    EKG EKG Interpretation  Date/Time:  Saturday October 24 2021 02:54:24 EST Ventricular Rate:  67 PR Interval:  214 QRS Duration: 88 QT Interval:  394 QTC Calculation: 416 R Axis:   -2 Text Interpretation: Sinus rhythm with 1st degree A-V block Inferior infarct , age undetermined Cannot rule out Anterior infarct , age undetermined Abnormal ECG When compared with  ECG of 20-Jun-2021 18:18, PREVIOUS ECG IS PRESENT Confirmed by Davonna Belling [phone]) on 10/24/2021 1:55:13 PM  Radiology DG Chest 2 View  Result Date: 10/24/2021 CLINICAL DATA:  Chest pain. EXAM: CHEST - 2 VIEW COMPARISON:  June 20, 2021 FINDINGS: Multiple sternal wires and vascular clips are noted. The heart size and mediastinal contours are within normal limits. There is mild calcification of the aortic arch and tortuosity of the descending thoracic  aorta. Both lungs are clear. The visualized skeletal structures are unremarkable. IMPRESSION: 1. Evidence of prior median sternotomy/CABG. 2. No acute cardiopulmonary disease. Electronically Signed   By: Virgina Norfolk M.D.   On: 10/24/2021 03:56   CT HEAD WO CONTRAST (5MM)  Result Date: 10/24/2021 CLINICAL DATA:  Delirium.  Visual changes. EXAM: CT HEAD WITHOUT CONTRAST TECHNIQUE: Contiguous axial images were obtained from the base of the skull through the vertex without intravenous contrast. RADIATION DOSE REDUCTION: This exam was performed according to the departmental dose-optimization program which includes automated exposure control, adjustment of the mA and/or kV according to patient size and/or use of iterative reconstruction technique. COMPARISON:  None. FINDINGS: Brain: No acute intracranial hemorrhage, midline shift or mass effect. No extra-axial fluid collection. Gray-white matter differentiation is within normal limits. Mild atrophy is noted. No hydrocephalus. Vascular: Atherosclerotic calcification of the carotid siphons and left vertebral artery. No hyperdense vessel. Skull: Normal. Negative for fracture or focal lesion. Sinuses/Orbits: No acute finding. Other: None. IMPRESSION: No acute intracranial process. Electronically Signed   By: Brett Fairy M.D.   On: 10/24/2021 03:49   US Abdomen Complete  Result Date: 10/24/2021 CLINICAL DATA:  Abdominal pain EXAM: ABDOMEN ULTRASOUND COMPLETE COMPARISON:  Renal ultrasound 12/08/2020  FINDINGS: Gallbladder: Surgically absent Common bile duct: Diameter: 4 mm Liver: Increased echogenicity with no focal mass visualized, limited visualization. Portal vein is patent on color Doppler imaging with normal direction of blood flow towards the liver. IVC: No abnormality visualized. Pancreas: Not well visualized. Spleen: Size and appearance within normal limits. Right Kidney: Length: 14.1 cm. Heterogeneous increased echogenicity. No hydronephrosis. Stable 4.5 x 4.2 cm anechoic cyst in the lower pole. Stable 4.6 x 3.8 cm solid heterogeneous exophytic mass at the lower pole. Left Kidney: Length: 14.4 cm. Heterogeneous increased echogenicity. No hydronephrosis. Small anechoic cysts measuring up to 1.8 cm. Abdominal aorta: No aneurysm visualized. Other findings: None. IMPRESSION: 1. No acute abnormality identified. 2. Evidence of chronic medical renal disease.  No hydronephrosis. 3. Stable size and appearance of exophytic solid mass at the lower right kidney. Bilateral renal cysts. Electronically Signed   By: Ofilia Neas M.D.   On: 10/24/2021 12:01    Procedures Procedures    Medications Ordered in ED Medications - No data to display  ED Course/ Medical Decision Making/ A&P                           Medical Decision Making Problems Addressed: Epigastric pain: acute illness or injury LFTs abnormal: acute illness or injury Vision changes: acute illness or injury  Amount and/or Complexity of Data Reviewed Labs: ordered. Radiology: ordered.   Patient presents with some mild mental status change.  Reportedly had been seeing some movement in his vision.  No blurring.  No headache.  Improved now.  Reportedly had some other confusion earlier today.  Also had some abdominal pain.  States that had resolved.  States that has been severe in the upper abdomen.  Head CT reviewed and reassuring.  Chest x-ray also independently interpreted and reassuring.  However LFTs mildly elevated.  Creatinine  elevated but near baseline.  Abdominal ultrasound done and reassuring.  Urine reassuring.  No infection seen. Family later states that he had started a new psychiatric medicine that had previously been on.  Started that a couple weeks ago.  That is when he began to feel worse.  Potentially could be a cause.  We will have patient follow-up with  his PCP, Dr. Brigitte Pulse.  History of hypothyroidism.  TSH mildly elevated but normal T4.  Can be adjusted by PCP.  Discharge home  Initial differential diagnosis for mental status changes is broad and includes pathology such as intracranial hemorrhage infection or psychiatric disorder.        Final Clinical Impression(s) / ED Diagnoses Final diagnoses:  Vision changes  LFTs abnormal  Epigastric pain    Rx / DC Orders ED Discharge Orders     None         Davonna Belling, MD 10/24/21 1400

## 2021-10-24 NOTE — Discharge Instructions (Signed)
Your liver enzymes are mildly abnormal.  You can follow-up with Dr. Brigitte Pulse for this.  Your TSH is still little high but your T4 is normal.  Potentially the new medication he been on could cause some of the changes in the vision and feeling off.

## 2021-10-24 NOTE — ED Triage Notes (Signed)
Patient reports mild confusion and " seeing things" at the ceiling while on bed this evening , patient stated the ceiling appears moving in waves , alert and oriented at triage , denies pain/respirations unlabored . Patient suspects side effects of medications .

## 2021-10-24 NOTE — ED Notes (Signed)
Patient transported to Ultrasound 

## 2021-10-28 DIAGNOSIS — F3342 Major depressive disorder, recurrent, in full remission: Secondary | ICD-10-CM | POA: Diagnosis not present

## 2021-10-28 DIAGNOSIS — I129 Hypertensive chronic kidney disease with stage 1 through stage 4 chronic kidney disease, or unspecified chronic kidney disease: Secondary | ICD-10-CM | POA: Diagnosis not present

## 2021-10-28 DIAGNOSIS — Z6837 Body mass index (BMI) 37.0-37.9, adult: Secondary | ICD-10-CM | POA: Diagnosis not present

## 2021-10-28 DIAGNOSIS — Z87891 Personal history of nicotine dependence: Secondary | ICD-10-CM | POA: Diagnosis not present

## 2021-10-28 DIAGNOSIS — N189 Chronic kidney disease, unspecified: Secondary | ICD-10-CM | POA: Diagnosis not present

## 2021-11-02 DIAGNOSIS — I129 Hypertensive chronic kidney disease with stage 1 through stage 4 chronic kidney disease, or unspecified chronic kidney disease: Secondary | ICD-10-CM | POA: Diagnosis not present

## 2021-11-02 DIAGNOSIS — G3184 Mild cognitive impairment, so stated: Secondary | ICD-10-CM | POA: Diagnosis not present

## 2021-11-02 DIAGNOSIS — E039 Hypothyroidism, unspecified: Secondary | ICD-10-CM | POA: Diagnosis not present

## 2021-11-02 DIAGNOSIS — Z23 Encounter for immunization: Secondary | ICD-10-CM | POA: Diagnosis not present

## 2021-11-02 DIAGNOSIS — F1320 Sedative, hypnotic or anxiolytic dependence, uncomplicated: Secondary | ICD-10-CM | POA: Diagnosis not present

## 2021-11-02 DIAGNOSIS — E785 Hyperlipidemia, unspecified: Secondary | ICD-10-CM | POA: Diagnosis not present

## 2021-11-02 DIAGNOSIS — R7301 Impaired fasting glucose: Secondary | ICD-10-CM | POA: Diagnosis not present

## 2021-11-02 DIAGNOSIS — G250 Essential tremor: Secondary | ICD-10-CM | POA: Diagnosis not present

## 2021-11-02 DIAGNOSIS — I25810 Atherosclerosis of coronary artery bypass graft(s) without angina pectoris: Secondary | ICD-10-CM | POA: Diagnosis not present

## 2021-11-02 DIAGNOSIS — N184 Chronic kidney disease, stage 4 (severe): Secondary | ICD-10-CM | POA: Diagnosis not present

## 2021-11-02 DIAGNOSIS — R7989 Other specified abnormal findings of blood chemistry: Secondary | ICD-10-CM | POA: Diagnosis not present

## 2021-11-02 DIAGNOSIS — E291 Testicular hypofunction: Secondary | ICD-10-CM | POA: Diagnosis not present

## 2021-12-01 DIAGNOSIS — E039 Hypothyroidism, unspecified: Secondary | ICD-10-CM | POA: Diagnosis not present

## 2021-12-01 DIAGNOSIS — N184 Chronic kidney disease, stage 4 (severe): Secondary | ICD-10-CM | POA: Diagnosis not present

## 2021-12-01 DIAGNOSIS — E785 Hyperlipidemia, unspecified: Secondary | ICD-10-CM | POA: Diagnosis not present

## 2021-12-01 DIAGNOSIS — I10 Essential (primary) hypertension: Secondary | ICD-10-CM | POA: Diagnosis not present

## 2021-12-08 DIAGNOSIS — Z6836 Body mass index (BMI) 36.0-36.9, adult: Secondary | ICD-10-CM | POA: Diagnosis not present

## 2021-12-08 DIAGNOSIS — F339 Major depressive disorder, recurrent, unspecified: Secondary | ICD-10-CM | POA: Diagnosis not present

## 2021-12-09 DIAGNOSIS — M170 Bilateral primary osteoarthritis of knee: Secondary | ICD-10-CM | POA: Diagnosis not present

## 2021-12-16 DIAGNOSIS — M170 Bilateral primary osteoarthritis of knee: Secondary | ICD-10-CM | POA: Diagnosis not present

## 2021-12-21 DIAGNOSIS — I2510 Atherosclerotic heart disease of native coronary artery without angina pectoris: Secondary | ICD-10-CM | POA: Diagnosis not present

## 2021-12-21 DIAGNOSIS — N1832 Chronic kidney disease, stage 3b: Secondary | ICD-10-CM | POA: Diagnosis not present

## 2021-12-21 DIAGNOSIS — N2581 Secondary hyperparathyroidism of renal origin: Secondary | ICD-10-CM | POA: Diagnosis not present

## 2021-12-21 DIAGNOSIS — E039 Hypothyroidism, unspecified: Secondary | ICD-10-CM | POA: Diagnosis not present

## 2021-12-21 DIAGNOSIS — N189 Chronic kidney disease, unspecified: Secondary | ICD-10-CM | POA: Diagnosis not present

## 2021-12-21 DIAGNOSIS — M1000 Idiopathic gout, unspecified site: Secondary | ICD-10-CM | POA: Diagnosis not present

## 2021-12-23 ENCOUNTER — Other Ambulatory Visit: Payer: Self-pay | Admitting: Internal Medicine

## 2021-12-23 ENCOUNTER — Ambulatory Visit
Admission: RE | Admit: 2021-12-23 | Discharge: 2021-12-23 | Disposition: A | Discharge status: Home / Self Care | Payer: PPO | Source: Ambulatory Visit | Attending: Internal Medicine | Admitting: Internal Medicine

## 2021-12-23 DIAGNOSIS — M170 Bilateral primary osteoarthritis of knee: Secondary | ICD-10-CM | POA: Diagnosis not present

## 2021-12-23 DIAGNOSIS — I10 Essential (primary) hypertension: Secondary | ICD-10-CM | POA: Diagnosis not present

## 2021-12-23 DIAGNOSIS — N200 Calculus of kidney: Secondary | ICD-10-CM | POA: Diagnosis not present

## 2021-12-23 DIAGNOSIS — R109 Unspecified abdominal pain: Secondary | ICD-10-CM

## 2021-12-23 DIAGNOSIS — R1011 Right upper quadrant pain: Secondary | ICD-10-CM | POA: Diagnosis not present

## 2021-12-24 ENCOUNTER — Other Ambulatory Visit (HOSPITAL_COMMUNITY): Payer: Self-pay | Admitting: Internal Medicine

## 2021-12-24 ENCOUNTER — Other Ambulatory Visit: Payer: Self-pay | Admitting: Internal Medicine

## 2021-12-24 DIAGNOSIS — N2889 Other specified disorders of kidney and ureter: Secondary | ICD-10-CM

## 2021-12-24 DIAGNOSIS — R222 Localized swelling, mass and lump, trunk: Secondary | ICD-10-CM

## 2021-12-25 ENCOUNTER — Other Ambulatory Visit: Payer: Self-pay

## 2021-12-25 ENCOUNTER — Ambulatory Visit (HOSPITAL_COMMUNITY)
Admission: RE | Admit: 2021-12-25 | Discharge: 2021-12-25 | Disposition: A | Discharge status: Home / Self Care | Payer: PPO | Source: Ambulatory Visit | Attending: Internal Medicine | Admitting: Internal Medicine

## 2021-12-25 DIAGNOSIS — K862 Cyst of pancreas: Secondary | ICD-10-CM | POA: Diagnosis not present

## 2021-12-25 DIAGNOSIS — R222 Localized swelling, mass and lump, trunk: Secondary | ICD-10-CM | POA: Diagnosis not present

## 2021-12-25 DIAGNOSIS — N281 Cyst of kidney, acquired: Secondary | ICD-10-CM | POA: Diagnosis not present

## 2021-12-25 DIAGNOSIS — R1900 Intra-abdominal and pelvic swelling, mass and lump, unspecified site: Secondary | ICD-10-CM | POA: Diagnosis not present

## 2021-12-25 DIAGNOSIS — N2889 Other specified disorders of kidney and ureter: Secondary | ICD-10-CM | POA: Diagnosis not present

## 2021-12-25 DIAGNOSIS — Z9049 Acquired absence of other specified parts of digestive tract: Secondary | ICD-10-CM | POA: Diagnosis not present

## 2021-12-25 MED ORDER — GADOBUTROL 1 MMOL/ML IV SOLN
10.0000 mL | Freq: Once | INTRAVENOUS | Status: AC | PRN
  Administered 2021-12-25: 10 mL via INTRAVENOUS

## 2021-12-29 DIAGNOSIS — N183 Chronic kidney disease, stage 3 unspecified: Secondary | ICD-10-CM | POA: Diagnosis not present

## 2021-12-29 DIAGNOSIS — D4101 Neoplasm of uncertain behavior of right kidney: Secondary | ICD-10-CM | POA: Diagnosis not present

## 2021-12-29 DIAGNOSIS — N200 Calculus of kidney: Secondary | ICD-10-CM | POA: Diagnosis not present

## 2021-12-29 DIAGNOSIS — N281 Cyst of kidney, acquired: Secondary | ICD-10-CM | POA: Diagnosis not present

## 2021-12-30 ENCOUNTER — Other Ambulatory Visit (HOSPITAL_COMMUNITY): Payer: Self-pay | Admitting: Internal Medicine

## 2021-12-30 ENCOUNTER — Other Ambulatory Visit: Payer: Self-pay

## 2021-12-30 ENCOUNTER — Emergency Department (HOSPITAL_COMMUNITY): Payer: PPO

## 2021-12-30 ENCOUNTER — Other Ambulatory Visit: Payer: Self-pay | Admitting: Internal Medicine

## 2021-12-30 ENCOUNTER — Encounter: Payer: Self-pay | Admitting: *Deleted

## 2021-12-30 ENCOUNTER — Emergency Department (HOSPITAL_COMMUNITY)
Admission: EM | Admit: 2021-12-30 | Discharge: 2021-12-30 | Disposition: A | Discharge status: Home / Self Care | Payer: PPO | Attending: Emergency Medicine | Admitting: Emergency Medicine

## 2021-12-30 ENCOUNTER — Encounter (HOSPITAL_COMMUNITY): Payer: Self-pay

## 2021-12-30 DIAGNOSIS — N200 Calculus of kidney: Secondary | ICD-10-CM | POA: Diagnosis not present

## 2021-12-30 DIAGNOSIS — Z79899 Other long term (current) drug therapy: Secondary | ICD-10-CM | POA: Insufficient documentation

## 2021-12-30 DIAGNOSIS — R222 Localized swelling, mass and lump, trunk: Secondary | ICD-10-CM

## 2021-12-30 DIAGNOSIS — Z20822 Contact with and (suspected) exposure to covid-19: Secondary | ICD-10-CM | POA: Diagnosis not present

## 2021-12-30 DIAGNOSIS — J9811 Atelectasis: Secondary | ICD-10-CM | POA: Diagnosis not present

## 2021-12-30 DIAGNOSIS — R079 Chest pain, unspecified: Secondary | ICD-10-CM | POA: Diagnosis not present

## 2021-12-30 DIAGNOSIS — R197 Diarrhea, unspecified: Secondary | ICD-10-CM | POA: Diagnosis not present

## 2021-12-30 DIAGNOSIS — Z7982 Long term (current) use of aspirin: Secondary | ICD-10-CM | POA: Diagnosis not present

## 2021-12-30 DIAGNOSIS — I959 Hypotension, unspecified: Secondary | ICD-10-CM | POA: Diagnosis not present

## 2021-12-30 DIAGNOSIS — M170 Bilateral primary osteoarthritis of knee: Secondary | ICD-10-CM | POA: Diagnosis not present

## 2021-12-30 DIAGNOSIS — R1011 Right upper quadrant pain: Secondary | ICD-10-CM | POA: Diagnosis not present

## 2021-12-30 DIAGNOSIS — K429 Umbilical hernia without obstruction or gangrene: Secondary | ICD-10-CM | POA: Diagnosis not present

## 2021-12-30 DIAGNOSIS — I10 Essential (primary) hypertension: Secondary | ICD-10-CM | POA: Diagnosis not present

## 2021-12-30 DIAGNOSIS — E039 Hypothyroidism, unspecified: Secondary | ICD-10-CM | POA: Insufficient documentation

## 2021-12-30 DIAGNOSIS — N281 Cyst of kidney, acquired: Secondary | ICD-10-CM | POA: Diagnosis not present

## 2021-12-30 LAB — COMPREHENSIVE METABOLIC PANEL
ALT: 28 U/L (ref 0–44)
ALT: 23 U/L (ref 0–44)
AST: 33 U/L (ref 15–41)
AST: 28 U/L (ref 15–41)
Albumin: 3.1 g/dL — ABNORMAL LOW (ref 3.5–5.0)
Albumin: 2.3 g/dL — ABNORMAL LOW (ref 3.5–5.0)
Alkaline Phosphatase: 52 U/L (ref 38–126)
Alkaline Phosphatase: 43 U/L (ref 38–126)
Anion gap: 10 (ref 5–15)
Anion gap: 8 (ref 5–15)
BUN: 31 mg/dL — ABNORMAL HIGH (ref 8–23)
BUN: 30 mg/dL — ABNORMAL HIGH (ref 8–23)
CO2: 25 mmol/L (ref 22–32)
CO2: 23 mmol/L (ref 22–32)
Calcium: 9.6 mg/dL (ref 8.9–10.3)
Calcium: 8 mg/dL — ABNORMAL LOW (ref 8.9–10.3)
Chloride: 100 mmol/L (ref 98–111)
Chloride: 105 mmol/L (ref 98–111)
Creatinine, Ser: 2.77 mg/dL — ABNORMAL HIGH (ref 0.61–1.24)
Creatinine, Ser: 2.33 mg/dL — ABNORMAL HIGH (ref 0.61–1.24)
GFR, Estimated: 23 mL/min — ABNORMAL LOW (ref >60)
GFR, Estimated: 29 mL/min — ABNORMAL LOW (ref >60)
Glucose, Bld: 124 mg/dL — ABNORMAL HIGH (ref 70–99)
Glucose, Bld: 118 mg/dL — ABNORMAL HIGH (ref 70–99)
Potassium: 4.5 mmol/L (ref 3.5–5.1)
Potassium: 4.1 mmol/L (ref 3.5–5.1)
Sodium: 135 mmol/L (ref 135–145)
Sodium: 136 mmol/L (ref 135–145)
Total Bilirubin: 0.8 mg/dL (ref 0.3–1.2)
Total Bilirubin: 0.6 mg/dL (ref 0.3–1.2)
Total Protein: 7.4 g/dL (ref 6.5–8.1)
Total Protein: 5.6 g/dL — ABNORMAL LOW (ref 6.5–8.1)

## 2021-12-30 LAB — LIPASE, BLOOD
Lipase: 33 U/L (ref 11–51)
Lipase: 30 U/L (ref 11–51)

## 2021-12-30 LAB — I-STAT CHEM 8, ED
BUN: 35 mg/dL — ABNORMAL HIGH (ref 8–23)
Calcium, Ion: 1.15 mmol/L (ref 1.15–1.40)
Chloride: 100 mmol/L (ref 98–111)
Creatinine, Ser: 2.7 mg/dL — ABNORMAL HIGH (ref 0.61–1.24)
Glucose, Bld: 125 mg/dL — ABNORMAL HIGH (ref 70–99)
HCT: 49 % (ref 39.0–52.0)
Hemoglobin: 16.7 g/dL (ref 13.0–17.0)
Potassium: 4.4 mmol/L (ref 3.5–5.1)
Sodium: 133 mmol/L — ABNORMAL LOW (ref 135–145)
TCO2: 27 mmol/L (ref 22–32)

## 2021-12-30 LAB — CBC WITH DIFFERENTIAL/PLATELET
Abs Immature Granulocytes: 0.04 10*3/uL (ref 0.00–0.07)
Basophils Absolute: 0 10*3/uL (ref 0.0–0.1)
Basophils Relative: 0 %
Eosinophils Absolute: 0.2 10*3/uL (ref 0.0–0.5)
Eosinophils Relative: 3 %
HCT: 40.1 % (ref 39.0–52.0)
Hemoglobin: 12.8 g/dL — ABNORMAL LOW (ref 13.0–17.0)
Immature Granulocytes: 1 %
Lymphocytes Relative: 12 %
Lymphs Abs: 1 10*3/uL (ref 0.7–4.0)
MCH: 31.4 pg (ref 26.0–34.0)
MCHC: 31.9 g/dL (ref 30.0–36.0)
MCV: 98.3 fL (ref 80.0–100.0)
Monocytes Absolute: 0.9 10*3/uL (ref 0.1–1.0)
Monocytes Relative: 11 %
Neutro Abs: 6.2 10*3/uL (ref 1.7–7.7)
Neutrophils Relative %: 73 %
Platelets: 188 10*3/uL (ref 150–400)
RBC: 4.08 MIL/uL — ABNORMAL LOW (ref 4.22–5.81)
RDW: 12.5 % (ref 11.5–15.5)
WBC: 8.3 10*3/uL (ref 4.0–10.5)
nRBC: 0 % (ref 0.0–0.2)

## 2021-12-30 LAB — URINALYSIS, ROUTINE W REFLEX MICROSCOPIC
Bilirubin Urine: NEGATIVE
Glucose, UA: NEGATIVE mg/dL
Ketones, ur: NEGATIVE mg/dL
Leukocytes,Ua: NEGATIVE
Nitrite: NEGATIVE
Protein, ur: >300 mg/dL — AB
Specific Gravity, Urine: 1.02 (ref 1.005–1.030)
pH: 6 (ref 5.0–8.0)

## 2021-12-30 LAB — LACTIC ACID, PLASMA: Lactic Acid, Venous: 1.1 mmol/L (ref 0.5–1.9)

## 2021-12-30 LAB — URINALYSIS, MICROSCOPIC (REFLEX): RBC / HPF: NONE SEEN RBC/hpf (ref 0–5)

## 2021-12-30 LAB — TROPONIN I (HIGH SENSITIVITY)
Troponin I (High Sensitivity): 8 ng/L (ref <18)
Troponin I (High Sensitivity): 7 ng/L (ref <18)

## 2021-12-30 LAB — RESP PANEL BY RT-PCR (FLU A&B, COVID) ARPGX2
Influenza A by PCR: NEGATIVE
Influenza B by PCR: NEGATIVE
SARS Coronavirus 2 by RT PCR: NEGATIVE

## 2021-12-30 MED ORDER — SODIUM CHLORIDE 0.9 % IV BOLUS
1000.0000 mL | Freq: Once | INTRAVENOUS | Status: AC
  Administered 2021-12-30: 1000 mL via INTRAVENOUS

## 2021-12-30 MED ORDER — ACETAMINOPHEN 325 MG PO TABS
650.0000 mg | ORAL_TABLET | Freq: Once | ORAL | Status: AC
Start: 2021-12-30 — End: 2021-12-30
  Administered 2021-12-30: 650 mg via ORAL
  Filled 2021-12-30: qty 2

## 2021-12-30 NOTE — ED Notes (Signed)
Patient verbalizes understanding of discharge instructions. Opportunity for questioning and answers were provided. Armband removed by staff, pt discharged from ED.  

## 2021-12-30 NOTE — ED Triage Notes (Signed)
Pt arrived POV from the drs office c/o hypotension. Per pt's family member he looks pale. Pt states he was recently diagnosed with a mass on his right side of his abdomen.   ?

## 2021-12-30 NOTE — ED Provider Notes (Signed)
?[facility] ?Provider Note ? ? ?CSN: [id number] ?Arrival date & time: 12/30/21  1214 ? ?  ? ?History ? ?Chief Complaint  ?Patient presents with  ? Hypotension  ? ? ?Dalton Mitchell is a 76 y.o. male. ? ?Patient presents to ER chief complaint of low blood pressure.  He states he has a routine appointment with his orthopedist today, when he went to the office blood pressure was noted to be in the 70 systolic range.  He states that he was complaining of some generalized fatigue and sensation of weakness, however no specific complaints of headache or chest pain.  He had some loose stools with past 2 days but denies any vomiting denies any fevers or cough.  He has recently been diagnosed with abdominal mass on CT imaging and follow-up MRI.  He is scheduled for an outpatient biopsy arranged by his primary care doctor.  He describes some right-sided abdominal pain ongoing for the past 2 weeks unchanged today.  Patient denies any fevers or cough.  No vomiting. ? ? ?  ? ?Home Medications ?Prior to Admission medications   ?Medication Sig Start Date End Date Taking? Authorizing Provider  ?allopurinol (ZYLOPRIM) 300 MG tablet Take 300 mg by mouth at bedtime.    [provider]  ?aspirin EC 325 MG tablet Take 325 mg by mouth at bedtime.    [provider]  ?cetirizine (ZYRTEC) 10 MG tablet Take 10 mg by mouth daily.    [provider]  ?Coenzyme Q10 (COQ-10 PO) Take 100 mg by mouth daily.    [provider]  ?escitalopram (LEXAPRO) 10 MG tablet Take by mouth. 08/07/20   [provider]  ?ezetimibe (ZETIA) 10 MG tablet Take 10 mg by mouth daily. 11/06/19   [provider]  ?famotidine (PEPCID) 20 MG tablet Take 20 mg by mouth daily as needed.     [provider]  ?FLUTICASONE PROPIONATE, NASAL, NA Place 2 Inhalers into the nose daily.    [provider]  ?levothyroxine (SYNTHROID, LEVOTHROID) 88 MCG tablet Take 88 mcg by  mouth daily before breakfast.    [provider]  ?losartan (COZAAR) 50 MG tablet Take 1 tablet by mouth daily. 11/25/17   [provider]  ?metoprolol (LOPRESSOR) 50 MG tablet Take 50 mg by mouth 2 (two) times daily.    [provider]  ?OVER THE COUNTER MEDICATION CBD ointment    [provider]  ?oxyCODONE-acetaminophen (PERCOCET/ROXICET) 5-325 MG tablet Take 1 tablet by mouth as needed. 03/02/21   [provider]  ?rosuvastatin (CRESTOR) 20 MG tablet Take 20 mg by mouth daily. 11/05/19   [provider]  ?senna-docusate (SENOKOT S) 8.6-50 MG per tablet Take 1 tablet by mouth 2 (two) times daily. ?Patient taking differently: Take 1 tablet by mouth as needed. 01/11/14   Rolan Bucco, MD  ?zolpidem (AMBIEN) 10 MG tablet Take 1 tablet (10 mg total) by mouth at bedtime. 06/10/20   Dohmeier, Asencion Partridge, MD  ?   ? ?Allergies    ?Morphine and related, Ace inhibitors, and Lisinopril   ? ?Review of Systems   ?Review of Systems  ?Constitutional:  Negative for fever.  ?HENT:  Negative for ear pain and sore throat.   ?Eyes:  Negative for pain.  ?Respiratory:  Negative for cough.   ?Cardiovascular:  Negative for chest pain.  ?Gastrointestinal:  Positive for abdominal pain.  ?Genitourinary:  Negative for flank pain.  ?Musculoskeletal:  Negative for back pain.  ?  Skin:  Negative for color change and rash.  ?Neurological:  Negative for syncope.  ?All other systems reviewed and are negative. ? ?Physical Exam ?Updated Vital Signs ?BP 118/77   Pulse 67   Temp (!) 97.4 ?F (36.3 ?C)   Resp 15   Ht '5\' 9"'$  (1.753 m)   Wt 113.4 kg   SpO2 96%   BMI 36.92 kg/m?  ?Physical Exam ?Constitutional:   ?   Appearance: He is well-developed.  ?HENT:  ?   Head: Normocephalic.  ?   Nose: Nose normal.  ?Eyes:  ?   Extraocular Movements: Extraocular movements intact.  ?Cardiovascular:  ?   Rate and Rhythm: Normal rate.  ?Pulmonary:  ?   Effort: Pulmonary effort is normal.  ?Abdominal:  ?    Tenderness: There is no guarding or rebound.  ?   Comments: Mild-moderate tenderness in the right upper quadrant at the site of his abdominal mass.  No peritoneal signs no guarding or rebound.  ?Skin: ?   Coloration: Skin is not jaundiced.  ?Neurological:  ?   Mental Status: He is alert. Mental status is at baseline.  ? ? ?ED Results / Procedures / Treatments   ?Labs ?(all labs ordered are listed, but only abnormal results are displayed) ?Labs Reviewed  ?URINALYSIS, ROUTINE W REFLEX MICROSCOPIC - Abnormal; Notable for the following components:  ?    Result Value  ? Hgb urine dipstick TRACE (*)   ? Protein, ur >300 (*)   ? All other components within normal limits  ?COMPREHENSIVE METABOLIC PANEL - Abnormal; Notable for the following components:  ? Glucose, Bld 124 (*)   ? BUN 31 (*)   ? Creatinine, Ser 2.77 (*)   ? Albumin 3.1 (*)   ? GFR, Estimated 23 (*)   ? All other components within normal limits  ?URINALYSIS, MICROSCOPIC (REFLEX) - Abnormal; Notable for the following components:  ? Bacteria, UA RARE (*)   ? All other components within normal limits  ?CBC WITH DIFFERENTIAL/PLATELET - Abnormal; Notable for the following components:  ? RBC 4.08 (*)   ? Hemoglobin 12.8 (*)   ? All other components within normal limits  ?I-STAT CHEM 8, ED - Abnormal; Notable for the following components:  ? Sodium 133 (*)   ? BUN 35 (*)   ? Creatinine, Ser 2.70 (*)   ? Glucose, Bld 125 (*)   ? All other components within normal limits  ?RESP PANEL BY RT-PCR (FLU A&B, COVID) ARPGX2  ?LACTIC ACID, PLASMA  ?LIPASE, BLOOD  ?CBC WITH DIFFERENTIAL/PLATELET  ?COMPREHENSIVE METABOLIC PANEL  ?LIPASE, BLOOD  ?TROPONIN I (HIGH SENSITIVITY)  ?TROPONIN I (HIGH SENSITIVITY)  ? ? ?EKG ?None ? ?Radiology ?CT ABDOMEN PELVIS WO CONTRAST ? ?Result Date: 12/30/2021 ?CLINICAL DATA:  Abdominal pain, acute, nonlocalized. Worsening abdominal pain with generalized weakness and fatigue. Known lesion in the right abdominal musculature. EXAM: CT ABDOMEN AND  PELVIS WITHOUT CONTRAST TECHNIQUE: Multidetector CT imaging of the abdomen and pelvis was performed following the standard protocol without IV contrast. RADIATION DOSE REDUCTION: This exam was performed according to the departmental dose-optimization program which includes automated exposure control, adjustment of the mA and/or kV according to patient size and/or use of iterative reconstruction technique. COMPARISON:  CT abdomen 12/23/2021.  MRI abdomen 12/25/2021 FINDINGS: Lower chest: Lung bases are clear. Prior median sternotomy. Coronary artery calcifications. Hepatobiliary: Decreased pneumobilia since the previous examination. Gallbladder is surgically absent. Pancreas: Unremarkable. No pancreatic ductal dilatation or surrounding inflammatory changes. Spleen: Normal in size  without focal abnormality. Adrenals/Urinary Tract: Normal appearance of both adrenal glands. 2 small stones in left kidney largest measures 4 mm without hydronephrosis. Normal appearance of the urinary bladder. Small right renal calculi without hydronephrosis. Again noted are exophytic hyperdense structures in both kidneys. These were described as Bosniak 1 Bosniak 2 cysts on the recent MRI. Stomach/Bowel: Normal appearance of the stomach and duodenum. Normal appendix. No evidence for bowel inflammation or obstruction. Vascular/Lymphatic: Aortic atherosclerosis. No enlarged abdominal or pelvic lymph nodes. Reproductive: Enlarged prostate with asymmetric bulge along the right posterior aspect of the prostate. Other: Again noted is a lesion along the right lateral abdominal wall involving the upper oblique musculature. Structure has enlarged since 12/23/2021. This structure measures 5.3 x 4.9 cm on sequence 3, image 28 and measured 4.5 x 3.4 cm on 12/23/2021. Central aspect of this structure is low-density and fluid density. Structure has also enlarged in the craniocaudal dimension. Increased subcutaneous edema in the right lateral abdomen.  Negative for free fluid. Negative for free air. Musculoskeletal: Multilevel degenerative disease in the thoracic and lumbar spine. A tiny umbilical hernia containing fat. There appears to be a superior ventral hernia just

## 2021-12-30 NOTE — Discharge Instructions (Addendum)
Hold your blood pressure medicines today and tomorrow.  If your blood pressure goes over 683 systolic then take your medications otherwise hold them. ? ?Call your doctor in 2 or 3 days and follow-up with them. ? ?Return back to the ER if you have any additional symptoms such as fevers cough worsening pain vomiting diarrhea or any additional concerns. ?

## 2021-12-30 NOTE — Progress Notes (Unsigned)
Dalton Keen, MD  [name] ?Ok for [place] core bx RT ABD WALL MASS  ? ?See recent CT and MR  ? ?TS   ?  ?   ?Previous Messages ?  ?----- Message -----  ?From: [name]  ?Sent: 12/30/2021  10:11 AM EDT  ?To: [facility] Procedure Requests  ?Subject: US Biopsy                                      ? ?STAT  ? ?US Biopsy  ? ?Abd wall mass  ? ?MRI in epic  ? ?Dr. Brigitte Pulse  ?[phone]  ?

## 2021-12-30 NOTE — ED Provider Triage Note (Signed)
Emergency Medicine Provider Triage Evaluation Note ? ?Dalton Mitchell , a 76 y.o. male  was evaluated in triage.  Pt complains of pretension.  He was sent over here by his doctor's office today.  He states that he was recently diagnosed with a large abdominal mass on the right side.  He does not know what type of cancer this is or if it is malignant.  He recently had an MRI.  He says over the last few days has had worsening abdominal pain, generalized weakness, fatigue.  He also is feels like his skin color is also pale. ? ?Review of Systems  ?Positive: Abdominal pain, generalized weakness, fatigue, hypotension ?Negative:  ? ?Physical Exam  ?BP 98/76 (BP Location: Left Arm)   Pulse 85   Temp (!) 97.4 ?F (36.3 ?C)   Resp 14   Ht '5\' 9"'$  (1.753 m)   Wt 113.4 kg   SpO2 94%   BMI 36.92 kg/m?  ?Gen:   Awake, no distress   ?Resp:  Normal effort  ?MSK:   Moves extremities without difficulty  ?Other:  Mass felt on RUQ abdomen. Tender to the touch. Pallor noted on face ? ?Medical Decision Making  ?Medically screening exam initiated at 12:59 PM.  Appropriate orders placed.  Dalton Mitchell was informed that the remainder of the evaluation will be completed by another provider, this initial triage assessment does not replace that evaluation, and the importance of remaining in the ED until their evaluation is complete. ? ?Borderline Hypotension here. Broad workup initiated. CT scan since worsening pain from last scan. ?  ?Dalton Birchwood, PA-C ?12/30/21 1302 ? ?

## 2022-01-01 ENCOUNTER — Other Ambulatory Visit: Payer: Self-pay | Admitting: Radiology

## 2022-01-03 NOTE — H&P (Signed)
Chief Complaint: Patient was seen in consultation today for right upper quadrant abdominal wall mass biopsy at the request of Shaw,William D Jr.  Referring Physician(s): Shaw,William D Jr.  Supervising Physician: Malachy Moan  Patient Status: [facility] - Out-pt  History of Present Illness: Dalton Mitchell is a 76 y.o. male with past medical history of CAD, MI, CKD stage III, HLD, hypertensive heart disease and OSA on CPAP.  Patient presented to PCP complaining of right upper quadrant pain.  Patient had subsequent MRI abdomen 12/23/2021 showed lesion in right upper abdominal wall concerning for metastatic implant/primary soft tissue neoplasm such as sarcoma with tissue sampling recommended by Dr. Trudie Reed.  Other findings at that time included kidney cyst, and cystic lesions scattered throughout the pancreas. Follow-up CT abdomen pelvis on 12/30/2021 showed lesion in the right lateral abdominal wall had enlarged since previous study 1 week prior concerning for aggressive neoplastic process versus infectious/inflammatory process with tissue sampling recommended by Dr. Richarda Overlie. Additional findings on that scan include bilateral renal lesions.  Patient was referred by Dr. Martha Clan for biopsy of right upper quadrant abdominal wall mass to evaluate sarcoma versus metastasis.    12/30/21 CT Abd/pelvis:  IMPRESSION: 1. The lesion along the right lateral abdominal wall has enlarged since 12/23/2021. Increased subcutaneous edema along the right abdomen. Differential diagnosis includes aggressive neoplastic process versus infectious or inflammatory process. Recommend tissue sampling of this area. 2. Bilateral renal calculi without hydronephrosis. 3. Again noted are bilateral renal lesions there were described as Bosniak 1 and Bosniak 2 cysts on recent MRI. 4.  Aortic atherosclerosis.  Coronary artery calcifications. 5. Prostate enlargement with asymmetry along the right  posterior aspect. Recommend correlation with PSA level.  Past Medical History:  Diagnosis Date   Adenomatous colon polyp    Allergy    seasonal   Arthritis    CAD    Prior inferior MI treated with stent 1995     Chronic kidney disease, stage III (GFR 30-59 ml/min)    Depression    past in early 90s   GERD 02/24/2008   Gout    History of kidney stones    Hyperlipidemia    Hypertensive heart disease    Hypothyroidism    Obesity (BMI 30-39.9)    Old inferior wall myocardial infarction    Sleep apnea    USES CPAP OCCASIONALLY    Past Surgical History:  Procedure Laterality Date   COLONOSCOPY  12/25/2014   [place] previously   CORONARY ANGIOPLASTY  1995   CORONARY ANGIOPLASTY WITH STENT PLACEMENT  1996   CORONARY ARTERY BYPASS GRAFT N/A 09/17/2013   Procedure: CORONARY ARTERY BYPASS GRAFTING (CABG);  Surgeon: Loreli Slot, MD;  Location: [facility] OR;  Service: Open Heart Surgery;  Laterality: N/A;   CYSTOSCOPY WITH RETROGRADE PYELOGRAM, URETEROSCOPY AND STENT PLACEMENT Right 01/11/2014   Procedure: CYSTOSCOPY WITH RETROGRADE PYELOGRAM, URETEROSCOPY AND STENT PLACEMENT;  Surgeon: Milford Cage, MD;  Location: [facility] ORS;  Service: Urology;  Laterality: Right;   CYSTOSCOPY WITH RETROGRADE PYELOGRAM, URETEROSCOPY AND STENT PLACEMENT Right 02/01/2014   Procedure: CYSTOSCOPY WITH RETROGRADE PYELOGRAM, URETEROSCOPY AND STENT EXCHANGE;  Surgeon: Milford Cage, MD;  Location: [facility] ORS;  Service: Urology;  Laterality: Right;   EXPLORATION POST OPERATIVE OPEN HEART N/A 09/17/2013   Procedure: EXPLORATION POST OPERATIVE OPEN HEART;  Surgeon: Loreli Slot, MD;  Location: [facility] OR;  Service: Open Heart Surgery;  Laterality: N/A;   HOLMIUM LASER APPLICATION Right 02/01/2014   Procedure: HOLMIUM  LASER APPLICATION;  Surgeon: Milford Cage, MD;  Location: [facility] ORS;  Service: Urology;  Laterality: Right;   LEFT HEART CATHETERIZATION WITH CORONARY ANGIOGRAM N/A 09/14/2013    Procedure: LEFT HEART CATHETERIZATION WITH CORONARY ANGIOGRAM;  Surgeon: Othella Boyer, MD;  Location: [facility] CATH LAB;  Service: Cardiovascular;  Laterality: N/A;   POLYPECTOMY      Allergies: Morphine and related, Ace inhibitors, and Lisinopril  Medications: Prior to Admission medications   Medication Sig Start Date End Date Taking? Authorizing Provider  allopurinol (ZYLOPRIM) 300 MG tablet Take 300 mg by mouth at bedtime.    [provider]  aspirin EC 325 MG tablet Take 325 mg by mouth at bedtime.    [provider]  cetirizine (ZYRTEC) 10 MG tablet Take 10 mg by mouth daily.    [provider]  Coenzyme Q10 (COQ-10 PO) Take 100 mg by mouth daily.    [provider]  escitalopram (LEXAPRO) 10 MG tablet Take by mouth. 08/07/20   [provider]  ezetimibe (ZETIA) 10 MG tablet Take 10 mg by mouth daily. 11/06/19   [provider]  famotidine (PEPCID) 20 MG tablet Take 20 mg by mouth daily as needed.     [provider]  FLUTICASONE PROPIONATE, NASAL, NA Place 2 Inhalers into the nose daily.    [provider]  levothyroxine (SYNTHROID, LEVOTHROID) 88 MCG tablet Take 88 mcg by mouth daily before breakfast.    [provider]  losartan (COZAAR) 50 MG tablet Take 1 tablet by mouth daily. 11/25/17   [provider]  metoprolol (LOPRESSOR) 50 MG tablet Take 50 mg by mouth 2 (two) times daily.    [provider]  OVER THE COUNTER MEDICATION CBD ointment    [provider]  oxyCODONE-acetaminophen (PERCOCET/ROXICET) 5-325 MG tablet Take 1 tablet by mouth as needed. 03/02/21   [provider]  rosuvastatin (CRESTOR) 20 MG tablet Take 20 mg by mouth daily. 11/05/19   [provider]  senna-docusate (SENOKOT S) 8.6-50 MG per tablet Take 1 tablet by mouth 2 (two) times daily. Patient taking differently: Take 1 tablet by mouth as needed. 01/11/14   Natalia Leatherwood, MD  zolpidem  (AMBIEN) 10 MG tablet Take 1 tablet (10 mg total) by mouth at bedtime. 06/10/20   Dohmeier, Porfirio Mylar, MD     Family History  Problem Relation Age of Onset   Colon cancer Maternal Grandmother    Colon polyps Maternal Grandmother    Emphysema Mother    Esophageal cancer Neg Hx    Rectal cancer Neg Hx    Stomach cancer Neg Hx     Social History   Socioeconomic History   Marital status: Married    Spouse name: Not on file   Number of children: 8   Years of education: college   Highest education level: Not on file  Occupational History   Occupation: Retired    Occupation: [employer]   Tobacco Use   Smoking status: Former    Types: Cigarettes    Quit date: 1970    Years since quitting: 53.2   Smokeless tobacco: Never   Tobacco comments:    Quit Charity fundraiser Use: Never used  Substance and Sexual Activity   Alcohol use: No   Drug use: Never   Sexual activity: Not Currently  Other Topics Concern   Not on file  Social History Narrative   Drinks about 1-2 caffeine beverages a day  Social Determinants of Health   Financial Resource Strain: Not on file  Food Insecurity: Not on file  Transportation Needs: Not on file  Physical Activity: Not on file  Stress: Not on file  Social Connections: Not on file    Review of Systems: A 12 point ROS discussed and pertinent positives are indicated in the HPI above.  All other systems are negative.  Review of Systems  Constitutional:  Negative for chills and fever.  Respiratory:  Negative for shortness of breath.   Cardiovascular:  Negative for chest pain.  Gastrointestinal:  Positive for abdominal pain. Negative for blood in stool, nausea and vomiting.  Genitourinary:  Negative for hematuria.   Vital Signs: BP 136/87   Pulse 93   Temp 99 F (37.2 C) (Oral)   Resp 18   SpO2 96%   Physical Exam Constitutional:      Appearance: Normal appearance.  Eyes:     Extraocular Movements: Extraocular movements intact.      Pupils: Pupils are equal, round, and reactive to light.  Cardiovascular:     Rate and Rhythm: Normal rate and regular rhythm.     Pulses: Normal pulses.     Heart sounds: Normal heart sounds.  Pulmonary:     Effort: Pulmonary effort is normal. No respiratory distress.     Breath sounds: Normal breath sounds.  Abdominal:     Palpations: There is mass.     Comments: Palpable mass to RUQ  Musculoskeletal:     Right lower leg: No edema.     Left lower leg: No edema.  Skin:    General: Skin is warm and dry.  Neurological:     Mental Status: He is alert and oriented to person, place, and time.  Psychiatric:        Mood and Affect: Mood normal.        Behavior: Behavior normal.        Thought Content: Thought content normal.        Judgment: Judgment normal.    Imaging: CT ABDOMEN PELVIS WO CONTRAST  Result Date: 12/30/2021 CLINICAL DATA:  Abdominal pain, acute, nonlocalized. Worsening abdominal pain with generalized weakness and fatigue. Known lesion in the right abdominal musculature. EXAM: CT ABDOMEN AND PELVIS WITHOUT CONTRAST TECHNIQUE: Multidetector CT imaging of the abdomen and pelvis was performed following the standard protocol without IV contrast. RADIATION DOSE REDUCTION: This exam was performed according to the departmental dose-optimization program which includes automated exposure control, adjustment of the mA and/or kV according to patient size and/or use of iterative reconstruction technique. COMPARISON:  CT abdomen 12/23/2021.  MRI abdomen 12/25/2021 FINDINGS: Lower chest: Lung bases are clear. Prior median sternotomy. Coronary artery calcifications. Hepatobiliary: Decreased pneumobilia since the previous examination. Gallbladder is surgically absent. Pancreas: Unremarkable. No pancreatic ductal dilatation or surrounding inflammatory changes. Spleen: Normal in size without focal abnormality. Adrenals/Urinary Tract: Normal appearance of both adrenal glands. 2 small stones in left  kidney largest measures 4 mm without hydronephrosis. Normal appearance of the urinary bladder. Small right renal calculi without hydronephrosis. Again noted are exophytic hyperdense structures in both kidneys. These were described as Bosniak 1 Bosniak 2 cysts on the recent MRI. Stomach/Bowel: Normal appearance of the stomach and duodenum. Normal appendix. No evidence for bowel inflammation or obstruction. Vascular/Lymphatic: Aortic atherosclerosis. No enlarged abdominal or pelvic lymph nodes. Reproductive: Enlarged prostate with asymmetric bulge along the right posterior aspect of the prostate. Other: Again noted is a lesion along the right lateral abdominal wall involving  the upper oblique musculature. Structure has enlarged since 12/23/2021. This structure measures 5.3 x 4.9 cm on sequence 3, image 28 and measured 4.5 x 3.4 cm on 12/23/2021. Central aspect of this structure is low-density and fluid density. Structure has also enlarged in the craniocaudal dimension. Increased subcutaneous edema in the right lateral abdomen. Negative for free fluid. Negative for free air. Musculoskeletal: Multilevel degenerative disease in the thoracic and lumbar spine. A tiny umbilical hernia containing fat. There appears to be a superior ventral hernia just below the xiphoid process containing fat. IMPRESSION: 1. The lesion along the right lateral abdominal wall has enlarged since 12/23/2021. Increased subcutaneous edema along the right abdomen. Differential diagnosis includes aggressive neoplastic process versus infectious or inflammatory process. Recommend tissue sampling of this area. 2. Bilateral renal calculi without hydronephrosis. 3. Again noted are bilateral renal lesions there were described as Bosniak 1 and Bosniak 2 cysts on recent MRI. 4.  Aortic atherosclerosis.  Coronary artery calcifications. 5. Prostate enlargement with asymmetry along the right posterior aspect. Recommend correlation with PSA level. Electronically  Signed   By: Richarda Overlie M.D.   On: 12/30/2021 15:19   CT ABDOMEN PELVIS WO CONTRAST  Result Date: 12/23/2021 CLINICAL DATA:  Right upper quadrant abdominal pain for 1 week. History of nephrolithiasis. Cholecystectomy. EXAM: CT ABDOMEN AND PELVIS WITHOUT CONTRAST TECHNIQUE: Multidetector CT imaging of the abdomen and pelvis was performed following the standard protocol without IV contrast. RADIATION DOSE REDUCTION: This exam was performed according to the departmental dose-optimization program which includes automated exposure control, adjustment of the mA and/or kV according to patient size and/or use of iterative reconstruction technique. COMPARISON:  05/03/2017 CT abdomen/pelvis. FINDINGS: Lower chest: No significant pulmonary nodules or acute consolidative airspace disease. Coronary atherosclerosis. Hepatobiliary: Normal liver size. No liver mass. Cholecystectomy. Pneumobilia throughout the left liver and common bile duct, presumably due to a history of sphincterotomy. Bile ducts are within normal post cholecystectomy limits with CBD diameter 5 mm. Pancreas: Normal, with no mass or duct dilation. Spleen: Normal size. No mass. Adrenals/Urinary Tract: Normal adrenals. Nonobstructing 3 mm and 2 mm interpolar right renal stones. Nonobstructing 3 mm and 2 mm upper left renal stones. No hydronephrosis. Exophytic soft tissue density 4.4 cm anterior lower right renal cortical mass (series 2/image 46), increased from 3.5 cm on 05/03/2017 CT. Exophytic soft tissue density 4.6 cm posterior lower right renal cortical mass (series 2/image 52), mildly increased from 4.3 cm on 05/03/2017 CT. Exophytic homogeneous hyperdense 2.1 cm renal cortical lesion in the posterior lower left kidney (series 2/image 52), increased from 1.2 cm on 05/03/2017 CT. Additional smaller scattered hypodense bilateral renal cortical lesions are too small to characterize. Normal caliber ureters. No ureteral stones. Chronic mild diffuse bladder wall  thickening, unchanged. No bladder stones. Stomach/Bowel: Normal non-distended stomach. Normal caliber small bowel with no small bowel wall thickening. Normal appendix. Minimal sigmoid diverticulosis with no large bowel wall thickening or significant pericolonic fat stranding. Vascular/Lymphatic: Atherosclerotic nonaneurysmal abdominal aorta. No pathologically enlarged lymph nodes in the abdomen or pelvis. Reproductive: Moderate prostatomegaly. Other: No pneumoperitoneum, ascites or focal fluid collection. Ventral lateral high right abdominal muscle wall 4.5 x 3.4 cm heterogeneous hypodense mass with mild surrounding fat stranding (series 2/image 28), new from 05/03/2017 CT. Musculoskeletal: No aggressive appearing focal osseous lesions. Marked thoracolumbar spondylosis. Intact lower sternotomy wires. IMPRESSION: 1. Indeterminate ventral lateral high right abdominal muscle wall 4.5 x 3.4 cm heterogeneous hypodense mass with mild surrounding fat stranding, new from 05/03/2017 CT. Differential includes intramuscular  hematoma, abscess or malignant mass such as metastasis or sarcoma. MRI (preferred) or CT abdomen without and with IV contrast suggested for further characterization. 2. Two indeterminate soft tissue density exophytic renal cortical masses in the lower right kidney measuring 4.6 cm and 4.4 cm, both mildly increased in size since 2018 CT. Renal cell carcinoma not excluded. MRI (preferred) or CT abdomen without and with IV contrast recommended. 3. Nonobstructing bilateral nephrolithiasis. No hydronephrosis. No ureteral or bladder stones. 4. Chronic mild diffuse bladder wall thickening, unchanged, probably due to chronic bladder outlet obstruction by the moderately enlarged prostate. 5. Minimal sigmoid diverticulosis. 6. Coronary atherosclerosis. 7. Aortic Atherosclerosis (ICD10-I70.0). These results will be called to the ordering clinician or representative by the Radiology Department at the imaging location.  Electronically Signed   By: Delbert Phenix M.D.   On: 12/23/2021 13:28   DG Chest 2 View  Result Date: 12/30/2021 CLINICAL DATA:  Right upper quadrant abdominal pain. EXAM: CHEST - 2 VIEW COMPARISON:  10/24/2021 FINDINGS: The cardiac silhouette, mediastinal and hilar contours are within normal limits. Stable mild tortuosity and calcification of the thoracic aorta. Stable surgical changes from bypass surgery. Stable eventration of the right hemidiaphragm with overlying vascular crowding and streaky atelectasis. No infiltrates or effusions. No pulmonary lesions. The bony thorax is intact. IMPRESSION: No acute cardiopulmonary findings. Electronically Signed   By: Rudie Meyer M.D.   On: 12/30/2021 13:31   MR ABDOMEN WWO CONTRAST  Result Date: 12/25/2021 CLINICAL DATA:  76 year old male with history of complex renal lesions and indeterminate lesion in the musculature of the right upper quadrant abdominal wall on prior CT examination. Follow-up study. EXAM: MRI ABDOMEN WITHOUT AND WITH CONTRAST TECHNIQUE: Multiplanar multisequence MR imaging of the abdomen was performed both before and after the administration of intravenous contrast. CONTRAST:  10mL GADAVIST GADOBUTROL 1 MMOL/ML IV SOLN COMPARISON:  Abdominal MRI 02/19/2014. CT of the abdomen and pelvis 12/23/2021. FINDINGS: Comment: Portions of today's examination are limited by considerable patient respiratory motion. Lower chest: Susceptibility artifact in the sternum from median sternotomy wires. Hepatobiliary: No suspicious cystic or solid hepatic lesions. No intra or extrahepatic biliary ductal dilatation. Status post cholecystectomy. Common bile duct measures 8 mm in the porta hepatis, within normal limits in this post cholecystectomy patient. No filling defects within the common bile duct to suggest choledocholithiasis. Pancreas: Multiple small T1 hypointense, T2 hyperintense, nonenhancing lesions are noted in the pancreas, largest of which is in the  pancreatic tail (coronal image 21 of series 3) measuring 8 mm. No larger more suspicious appearing pancreatic mass. No pancreatic ductal dilatation. No pancreatic or peripancreatic fluid collections or inflammatory changes. Spleen:  Unremarkable. Adrenals/Urinary Tract: Multiple renal lesions are noted bilaterally of varying degrees of complexity. Several of these are T1 hypointense, T2 hyperintense, and do not enhance, compatible with simple cysts, largest of which is in the lower pole of the left kidney measuring 1.9 cm (axial image 32 of series 28). Other lesions are T1 hyperintense, T2 hypointense, and do not enhance, compatible with proteinaceous/hemorrhagic cysts, best tip defied by a 2.1 cm lesion in the posterior aspect of the lower pole of the left kidney (axial image 82 of series 12). Still other lesions are varying degrees of T1 hyperintensity and T2 signal intensity, without definite enhancement on post gadolinium imaging. The best example of this is a exophytic lesion in the anterior aspect of the lower pole of the right kidney measuring 4.6 cm in diameter. These are also compatible with proteinaceous/hemorrhagic cysts. No  definite suspicious enhancing renal lesions are noted on today's examination. No hydroureteronephrosis in the visualized portions of the abdomen. Bilateral adrenal glands are normal in appearance. Stomach/Bowel: Visualized portions are unremarkable. Vascular/Lymphatic: No aneurysm identified in the visualized abdominal vasculature. No lymphadenopathy noted in the abdomen. Other: No significant volume of ascites noted in the visualized portions of the peritoneal cavity. Musculoskeletal: In the oblique musculature of the right lateral abdominal wall there is a 4.8 x 3.6 x 5.0 cm mass (axial image 25 of series 28 and coronal image 12 of series 3) which is heterogeneous in signal intensity on T1 and T2 weighted images, but generally centrally T1 hypointense and T2 hyperintense, with  peripheral T1 hyperintensity and T2 hypointensity, multiple thick internal septations and areas of mural and internal nodularity, which demonstrate extensive enhancement on post gadolinium imaging. This enhancement extends into the adjacent oblique musculature, as does increased T2 signal intensity, which could reflect additional tumoral extension, or surrounding reactive hyperemia. There does appear to be some diffusion restriction along the posterior aspect of the lesion. No aggressive appearing osseous lesions are noted in the visualized portions of the skeleton. Susceptibility artifact in the sternum from median sternotomy wires. IMPRESSION: 1. Aggressive appearing lesion in the oblique musculature of the right upper abdominal wall, concerning for metastatic implant or primary soft tissue neoplasm such as a sarcoma. Tissue sampling is recommended. 2. 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 3. Multiple tiny cystic lesions scattered throughout the pancreas measuring 8 mm or less in size, nonspecific, but favored to represent benign lesions such as tiny pancreatic pseudocysts or side branch IPMN (intraductal papillary mucinous neoplasm). Follow-up abdominal MRI with and without IV gadolinium with MRCP is recommended in 2 years to ensure stability of these findings. This recommendation follows ACR consensus guidelines: Management of Incidental Pancreatic Cysts: A White Paper of the ACR Incidental Findings Committee. J Am Coll Radiol 2017;14:911-923. 4. Additional incidental findings, as above. Electronically Signed   By: Trudie Reed M.D.   On: 12/25/2021 08:05    Labs:  CBC: Recent Labs    06/20/21 1835 10/24/21 0333 12/30/21 1338 12/30/21 2007  WBC 8.5 5.7  --  8.3  HGB 17.1* 16.2 16.7 12.8*  HCT 52.5* 49.9 49.0 40.1  PLT 225 184  --  188    COAGS: No results for input(s): INR, APTT in the last 8760 hours.  BMP: Recent Labs     06/20/21 1835 10/24/21 0333 12/30/21 1338 12/30/21 1415 12/30/21 2007  NA 136 135 133* 135 136  K 4.0 4.1 4.4 4.5 4.1  CL 99 103 100 100 105  CO2 25 23  --  25 23  GLUCOSE 136* 139* 125* 124* 118*  BUN 27* 24* 35* 31* 30*  CALCIUM 11.9* 9.9  --  9.6 8.0*  CREATININE 2.39* 2.39* 2.70* 2.77* 2.33*  GFRNONAA 28* 28*  --  23* 29*    LIVER FUNCTION TESTS: Recent Labs    06/20/21 1835 10/24/21 0333 12/30/21 1415 12/30/21 2007  BILITOT 1.0 0.9 0.8 0.6  AST 33 147* 33 28  ALT 27 279* 28 23  ALKPHOS 57 145* 52 43  PROT 8.1 7.4 7.4 5.6*  ALBUMIN 4.4 3.5 3.1* 2.3*    TUMOR MARKERS: No results for input(s): AFPTM, CEA, CA199, CHROMGRNA in the last 8760 hours.  Assessment and Plan: History of CAD, MI, CKD stage III, HLD, hypertensive heart disease and OSA on CPAP.  Patient presented to PCP  complaining of right upper quadrant pain.  Patient had subsequent MRI abdomen 12/23/2021 showed lesion in right upper abdominal wall concerning for metastatic implant/primary soft tissue neoplasm such as sarcoma with tissue sampling recommended by Dr. Trudie Reed.  Other findings at that time included kidney cyst, and cystic lesions scattered throughout the pancreas. Follow-up CT abdomen pelvis on 12/30/2021 showed lesion in the right lateral abdominal wall had enlarged since previous study 1 week prior concerning for aggressive neoplastic process versus infectious/inflammatory process with tissue sampling recommended by Dr. Richarda Overlie. Additional findings on that scan include bilateral renal lesions.  Patient was referred by Dr. Martha Clan for biopsy of right upper quadrant abdominal wall mass to evaluate sarcoma versus metastasis.    12/30/21 CT Abd/pelvis:  IMPRESSION: 1. The lesion along the right lateral abdominal wall has enlarged since 12/23/2021. Increased subcutaneous edema along the right abdomen. Differential diagnosis includes aggressive neoplastic process versus infectious or  inflammatory process. Recommend tissue sampling of this area. 2. Bilateral renal calculi without hydronephrosis. 3. Again noted are bilateral renal lesions there were described as Bosniak 1 and Bosniak 2 cysts on recent MRI. 4.  Aortic atherosclerosis.  Coronary artery calcifications. 5. Prostate enlargement with asymmetry along the right posterior aspect. Recommend correlation with PSA level.  Pt is A&O, calm and pleasant. He is in no distress.  Pt is NPO per order.   He is not taking his ASA.   VSS  Risks and benefits of right upper quadrant abdominal mass biopsy with moderate sedation was discussed with the patient and/or patient's family including, but not limited to bleeding, infection, damage to adjacent structures or low yield requiring additional tests.  All of the questions were answered and there is agreement to proceed.  Consent signed and in chart.   Thank you for this interesting consult.  I greatly enjoyed meeting Dalton Mitchell and look forward to participating in their care.  A copy of this report was sent to the requesting provider on this date.  Electronically Signed: Shon Hough, NP 01/04/2022, 11:57 AM   I spent a total of 20 minutes in face to face in clinical consultation, greater than 50% of which was counseling/coordinating care for right upper quadrant abdominal mass biopsy with moderate sedation.

## 2022-01-04 ENCOUNTER — Encounter (HOSPITAL_COMMUNITY): Payer: Self-pay

## 2022-01-04 ENCOUNTER — Other Ambulatory Visit (HOSPITAL_COMMUNITY): Payer: Self-pay | Admitting: Interventional Radiology

## 2022-01-04 ENCOUNTER — Other Ambulatory Visit: Payer: Self-pay

## 2022-01-04 ENCOUNTER — Other Ambulatory Visit (HOSPITAL_COMMUNITY): Payer: Self-pay | Admitting: Transplant Surgery

## 2022-01-04 ENCOUNTER — Ambulatory Visit (HOSPITAL_COMMUNITY)
Admission: RE | Admit: 2022-01-04 | Discharge: 2022-01-04 | Disposition: A | Discharge status: Home / Self Care | Payer: PPO | Source: Ambulatory Visit | Attending: Internal Medicine | Admitting: Internal Medicine

## 2022-01-04 DIAGNOSIS — I119 Hypertensive heart disease without heart failure: Secondary | ICD-10-CM | POA: Insufficient documentation

## 2022-01-04 DIAGNOSIS — G4733 Obstructive sleep apnea (adult) (pediatric): Secondary | ICD-10-CM | POA: Insufficient documentation

## 2022-01-04 DIAGNOSIS — N183 Chronic kidney disease, stage 3 unspecified: Secondary | ICD-10-CM | POA: Insufficient documentation

## 2022-01-04 DIAGNOSIS — R1011 Right upper quadrant pain: Secondary | ICD-10-CM | POA: Insufficient documentation

## 2022-01-04 DIAGNOSIS — L02211 Cutaneous abscess of abdominal wall: Secondary | ICD-10-CM | POA: Diagnosis not present

## 2022-01-04 DIAGNOSIS — R222 Localized swelling, mass and lump, trunk: Secondary | ICD-10-CM | POA: Diagnosis not present

## 2022-01-04 DIAGNOSIS — I252 Old myocardial infarction: Secondary | ICD-10-CM | POA: Insufficient documentation

## 2022-01-04 DIAGNOSIS — I2510 Atherosclerotic heart disease of native coronary artery without angina pectoris: Secondary | ICD-10-CM | POA: Insufficient documentation

## 2022-01-04 DIAGNOSIS — E785 Hyperlipidemia, unspecified: Secondary | ICD-10-CM | POA: Diagnosis not present

## 2022-01-04 DIAGNOSIS — M7989 Other specified soft tissue disorders: Secondary | ICD-10-CM | POA: Diagnosis not present

## 2022-01-04 LAB — CBC WITH DIFFERENTIAL/PLATELET
Abs Immature Granulocytes: 0.07 10*3/uL (ref 0.00–0.07)
Basophils Absolute: 0 10*3/uL (ref 0.0–0.1)
Basophils Relative: 0 %
Eosinophils Absolute: 0.3 10*3/uL (ref 0.0–0.5)
Eosinophils Relative: 3 %
HCT: 45.6 % (ref 39.0–52.0)
Hemoglobin: 15 g/dL (ref 13.0–17.0)
Immature Granulocytes: 1 %
Lymphocytes Relative: 7 %
Lymphs Abs: 0.7 10*3/uL (ref 0.7–4.0)
MCH: 30.9 pg (ref 26.0–34.0)
MCHC: 32.9 g/dL (ref 30.0–36.0)
MCV: 94 fL (ref 80.0–100.0)
Monocytes Absolute: 1.2 10*3/uL — ABNORMAL HIGH (ref 0.1–1.0)
Monocytes Relative: 11 %
Neutro Abs: 8.7 10*3/uL — ABNORMAL HIGH (ref 1.7–7.7)
Neutrophils Relative %: 78 %
Platelets: 269 10*3/uL (ref 150–400)
RBC: 4.85 MIL/uL (ref 4.22–5.81)
RDW: 12.1 % (ref 11.5–15.5)
WBC: 11 10*3/uL — ABNORMAL HIGH (ref 4.0–10.5)
nRBC: 0 % (ref 0.0–0.2)

## 2022-01-04 LAB — BASIC METABOLIC PANEL
Anion gap: 11 (ref 5–15)
BUN: 28 mg/dL — ABNORMAL HIGH (ref 8–23)
CO2: 24 mmol/L (ref 22–32)
Calcium: 9.4 mg/dL (ref 8.9–10.3)
Chloride: 98 mmol/L (ref 98–111)
Creatinine, Ser: 2.26 mg/dL — ABNORMAL HIGH (ref 0.61–1.24)
GFR, Estimated: 30 mL/min — ABNORMAL LOW (ref >60)
Glucose, Bld: 155 mg/dL — ABNORMAL HIGH (ref 70–99)
Potassium: 4 mmol/L (ref 3.5–5.1)
Sodium: 133 mmol/L — ABNORMAL LOW (ref 135–145)

## 2022-01-04 LAB — PROTIME-INR
INR: 1.1 (ref 0.8–1.2)
Prothrombin Time: 14.5 seconds (ref 11.4–15.2)

## 2022-01-04 MED ORDER — MIDAZOLAM HCL 2 MG/2ML IJ SOLN
INTRAMUSCULAR | Status: AC | PRN
  Administered 2022-01-04: 1 mg via INTRAVENOUS

## 2022-01-04 MED ORDER — SODIUM CHLORIDE 0.9% FLUSH
5.0000 mL | Freq: Three times a day (TID) | INTRAVENOUS | Status: DC
Start: 2022-01-04 — End: 2022-01-05

## 2022-01-04 MED ORDER — LIDOCAINE HCL 1 % IJ SOLN
INTRAMUSCULAR | Status: AC
  Administered 2022-01-04: 10 mL
  Filled 2022-01-04: qty 20

## 2022-01-04 MED ORDER — MIDAZOLAM HCL 2 MG/2ML IJ SOLN
INTRAMUSCULAR | Status: AC
  Filled 2022-01-04: qty 2

## 2022-01-04 MED ORDER — FENTANYL CITRATE (PF) 100 MCG/2ML IJ SOLN
INTRAMUSCULAR | Status: AC | PRN
Start: 2022-01-04 — End: 2022-01-04
  Administered 2022-01-04: 50 ug via INTRAVENOUS

## 2022-01-04 MED ORDER — FENTANYL CITRATE (PF) 100 MCG/2ML IJ SOLN
INTRAMUSCULAR | Status: AC
  Filled 2022-01-04: qty 2

## 2022-01-04 MED ORDER — SODIUM CHLORIDE 0.9 % IV SOLN: INTRAVENOUS | Status: DC

## 2022-01-04 NOTE — Discharge Instructions (Addendum)
Interventional radiology phone numbers ?[phone] ?After hours [phone] ? ?Needle Biopsy, Care After ?These instructions tell you how to care for yourself after your procedure. Your doctor may also give you more specific instructions. Call your doctor if you have any problems or questions. ?What can I expect after the procedure? ?After the procedure, it is common to have: ?Soreness. ?Bruising. ?Mild pain. ?Follow these instructions at home: ?Return to your normal activities as told by your doctor. Ask your doctor what activities are safe for you. ?Take over-the-counter and prescription medicines only as told by your doctor. ?Wash your hands with soap and water before you change your bandage (dressing). If you cannot use soap and water, use hand sanitizer. ?Follow instructions from your doctor about: ?How to take care of your puncture site. ?When and how to change your bandage. ?When to remove your bandage. ?Check your puncture site every day for signs of infection. Watch for: ?Redness, swelling, or pain. ?Fluid or blood. ?Pus or a bad smell. ?Warmth. ?Do not take baths, swim, or use a hot tub until your doctor approves. You may remove your dressing tomorrow and shower. ?Keep all follow-up visits as told by your doctor. This is important.   ?Contact a doctor if you have: ?A fever. ?Redness, swelling, or pain at the puncture site, and it lasts longer than a few days. ?Fluid, blood, or pus coming from the puncture site. ?Warmth coming from the puncture site. ?Get help right away if: ?You have a lot of bleeding from the puncture site. ?Summary ?After the procedure, it is common to have soreness, bruising, or mild pain at the puncture site. ?Check your puncture site every day for signs of infection, such as redness, swelling, or pain. ?Get help right away if you have severe bleeding from your puncture site. ?This information is not intended to replace advice given to you by your health care provider. Make sure you  discuss any questions you have with your health care provider. ?Document Revised: 03/20/2020 Document Reviewed: 03/20/2020 ?Elsevier Patient Education ? 2021 [employer] ? ? ? ? ?Moderate Conscious Sedation, Adult, Care After ?This sheet gives you information about how to care for yourself after your procedure. Your health care provider may also give you more specific instructions. If you have problems or questions, contact your health care provider. ?What can I expect after the procedure? ?After the procedure, it is common to have: ?Sleepiness for several hours. ?Impaired judgment for several hours. ?Difficulty with balance. ?Vomiting if you eat too soon. ?Follow these instructions at home: ?For the time period you were told by your health care provider: ?Rest. ?Do not participate in activities where you could fall or become injured. ?Do not drive or use machinery. ?Do not drink alcohol. ?Do not take sleeping pills or medicines that cause drowsiness. ?Do not make important decisions or sign legal documents. ?Do not take care of children on your own.  ?  ?  ?Eating and drinking ?Follow the diet recommended by your health care provider. ?Drink enough fluid to keep your urine pale yellow. ?If you vomit: ?Drink water, juice, or soup when you can drink without vomiting. ?Make sure you have little or no nausea before eating solid foods.   ?General instructions ?Take over-the-counter and prescription medicines only as told by your health care provider. ?Have a responsible adult stay with you for the time you are told. It is important to have someone help care for you until you are awake and alert. ?Do not smoke. ?  Keep all follow-up visits as told by your health care provider. This is important. ?Contact a health care provider if: ?You are still sleepy or having trouble with balance after 24 hours. ?You feel light-headed. ?You keep feeling nauseous or you keep vomiting. ?You develop a rash. ?You have a fever. ?You have  redness or swelling around the IV site. ?Get help right away if: ?You have trouble breathing. ?You have new-onset confusion at home. ?Summary ?After the procedure, it is common to feel sleepy, have impaired judgment, or feel nauseous if you eat too soon. ?Rest after you get home. Know the things you should not do after the procedure. ?Follow the diet recommended by your health care provider and drink enough fluid to keep your urine pale yellow. ?Get help right away if you have trouble breathing or new-onset confusion at home. ?This information is not intended to replace advice given to you by your health care provider. Make sure you discuss any questions you have with your health care provider. ?Document Revised: 01/18/2020 Document Reviewed: 08/16/2019 ?Elsevier Patient Education ? 2021 Elsevier Inc.  ?

## 2022-01-04 NOTE — Procedures (Signed)
Interventional Radiology Procedure Note ? ?Procedure:  ?1.) Initial aspiration yields foul smelling purulent fluid ?2.) Placement of 17F drain  ?3.) US guided core biopsy of the wall of the abscess vs. Necrotic mass ? ?Complications: None ? ?Estimated Blood Loss: None ? ?Recommendations: ?- Starting Augmentin ?- Drain teaching, flush drain BID ?- F/U in drain clinic in 7-10 days ? ? ?Signed, ? ?Criselda Peaches, MD ? ? ?

## 2022-01-04 NOTE — Progress Notes (Signed)
Instructed patient's wife in BID flushing of catheter with 5 ml of NS. 20 saline flushes provided to family. Wife instructed in reapplying JP drain to catheter and charging the JP drain. Wife verbalizes understanding of procedure.  ? ?IR clinic is to call family for follow up appointment to assess drain and to do follow up CT scan. Family verbalizes understanding. ?

## 2022-01-05 LAB — SURGICAL PATHOLOGY

## 2022-01-06 ENCOUNTER — Other Ambulatory Visit: Payer: Self-pay | Admitting: Internal Medicine

## 2022-01-06 DIAGNOSIS — L02211 Cutaneous abscess of abdominal wall: Secondary | ICD-10-CM

## 2022-01-06 DIAGNOSIS — R222 Localized swelling, mass and lump, trunk: Secondary | ICD-10-CM

## 2022-01-09 LAB — AEROBIC/ANAEROBIC CULTURE W GRAM STAIN (SURGICAL/DEEP WOUND): Special Requests: NORMAL

## 2022-01-12 ENCOUNTER — Ambulatory Visit
Admission: RE | Admit: 2022-01-12 | Discharge: 2022-01-12 | Disposition: A | Discharge status: Home / Self Care | Payer: PPO | Source: Ambulatory Visit | Attending: Internal Medicine | Admitting: Internal Medicine

## 2022-01-12 DIAGNOSIS — R222 Localized swelling, mass and lump, trunk: Secondary | ICD-10-CM

## 2022-01-12 DIAGNOSIS — N200 Calculus of kidney: Secondary | ICD-10-CM | POA: Diagnosis not present

## 2022-01-12 DIAGNOSIS — L02211 Cutaneous abscess of abdominal wall: Secondary | ICD-10-CM | POA: Diagnosis not present

## 2022-01-12 DIAGNOSIS — N281 Cyst of kidney, acquired: Secondary | ICD-10-CM | POA: Diagnosis not present

## 2022-01-12 DIAGNOSIS — M2578 Osteophyte, vertebrae: Secondary | ICD-10-CM | POA: Diagnosis not present

## 2022-01-12 DIAGNOSIS — Z4682 Encounter for fitting and adjustment of non-vascular catheter: Secondary | ICD-10-CM | POA: Diagnosis not present

## 2022-01-12 HISTORY — PX: IR RADIOLOGIST EVAL & MGMT: IMG5224

## 2022-01-12 NOTE — Progress Notes (Signed)
? ?Referring Physician(s): ?Clapp,Susan T ? ?Chief Complaint: ?The patient is seen in follow up today s/p drainage placement into abdominal wall abscess with Dr. Laurence Ferrari on 01/04/22 ? ?History of present illness: ?Dalton Mitchell is a 76 y.o. male with past medical history of CAD, MI, CKD stage III, HLD, hypertensive heart disease and OSA on CPAP.  Patient presented to PCP complaining of right upper quadrant pain.  Patient had subsequent MRI abdomen 12/23/2021 showed lesion in right upper abdominal wall concerning for metastatic implant/primary soft tissue neoplasm such as sarcoma with tissue sampling recommended by Dr. Vinnie Langton.  Follow-up CT abdomen pelvis on 12/30/2021 showed lesion in the right lateral abdominal wall had enlarged since previous study 1 week prior concerning for aggressive neoplastic process versus nfectious/inflammatory process with tissue sampling recommended by Dr. Markus Daft.   Patient underwent biopsy and drainage of abdominal wall lesion on 01/04/22 and was started on antibiotic.  Cultures revealed Enterococcus Faecalis.  Pathology results fibroadipose tissue and skeletal muscle with suppurative inflammation consistent with abscess.  No evidence of malignancy.   He presents today with his wife reporting feeling much better.  His energy is returning and his pain is resolved.  He is currently flushing his drain daily with the help of his wife and reports minimal OP.  Denies Fever, chills, N/V.  He has been taking his antibiotic and has a f/u apppointment with PCP next week. ? ?Past Medical History:  ?Diagnosis Date  ? Adenomatous colon polyp   ? Allergy   ? seasonal  ? Arthritis   ? CAD   ? Prior inferior MI treated with stent 1995    ? Chronic kidney disease, stage III (GFR 30-59 ml/min)   ? Depression   ? past in early 90s  ? GERD 02/24/2008  ? Gout   ? History of kidney stones   ? Hyperlipidemia   ? Hypertensive heart disease   ? Hypothyroidism   ? Obesity (BMI 30-39.9)   ? Old  inferior wall myocardial infarction   ? Sleep apnea   ? USES CPAP OCCASIONALLY  ? ? ?Past Surgical History:  ?Procedure Laterality Date  ? COLONOSCOPY  12/25/2014  ? MANY previously  ? CORONARY ANGIOPLASTY  1995  ? CORONARY ANGIOPLASTY WITH STENT PLACEMENT  1996  ? CORONARY ARTERY BYPASS GRAFT N/A 09/17/2013  ? Procedure: CORONARY ARTERY BYPASS GRAFTING (CABG);  Surgeon: Melrose Nakayama, MD;  Location: [facility];  Service: Open Heart Surgery;  Laterality: N/A;  ? CYSTOSCOPY WITH RETROGRADE PYELOGRAM, URETEROSCOPY AND STENT PLACEMENT Right 01/11/2014  ? Procedure: CYSTOSCOPY WITH RETROGRADE PYELOGRAM, URETEROSCOPY AND STENT PLACEMENT;  Surgeon: Molli Hazard, MD;  Location: [facility];  Service: Urology;  Laterality: Right;  ? CYSTOSCOPY WITH RETROGRADE PYELOGRAM, URETEROSCOPY AND STENT PLACEMENT Right 02/01/2014  ? Procedure: CYSTOSCOPY WITH RETROGRADE PYELOGRAM, URETEROSCOPY AND STENT EXCHANGE;  Surgeon: Molli Hazard, MD;  Location: [facility];  Service: Urology;  Laterality: Right;  ? EXPLORATION POST OPERATIVE OPEN HEART N/A 09/17/2013  ? Procedure: EXPLORATION POST OPERATIVE OPEN HEART;  Surgeon: Melrose Nakayama, MD;  Location: [facility];  Service: Open Heart Surgery;  Laterality: N/A;  ? HOLMIUM LASER APPLICATION Right 06/08/2840  ? Procedure: HOLMIUM LASER APPLICATION;  Surgeon: Molli Hazard, MD;  Location: [facility];  Service: Urology;  Laterality: Right;  ? IR RADIOLOGIST EVAL & MGMT  01/12/2022  ? LEFT HEART CATHETERIZATION WITH CORONARY ANGIOGRAM N/A 09/14/2013  ? Procedure: LEFT HEART CATHETERIZATION WITH CORONARY ANGIOGRAM;  Surgeon: Grace Bushy  Wynonia Lawman, MD;  Location: [facility] CATH [facility];  Service: Cardiovascular;  Laterality: N/A;  ? POLYPECTOMY    ? ? ?Allergies: ?Morphine and related, Ace inhibitors, and Lisinopril ? ?Medications: ?Prior to Admission medications   ?Medication Sig Start Date End Date Taking? Authorizing Provider  ?allopurinol (ZYLOPRIM) 300 MG tablet Take 300 mg by mouth at bedtime.     [provider]  ?aspirin EC 325 MG tablet Take 325 mg by mouth at bedtime.    [provider]  ?cetirizine (ZYRTEC) 10 MG tablet Take 10 mg by mouth daily.    [provider]  ?Coenzyme Q10 (COQ-10 PO) Take 100 mg by mouth daily.    [provider]  ?escitalopram (LEXAPRO) 10 MG tablet Take by mouth. 08/07/20   [provider]  ?ezetimibe (ZETIA) 10 MG tablet Take 10 mg by mouth daily. 11/06/19   [provider]  ?famotidine (PEPCID) 20 MG tablet Take 20 mg by mouth daily as needed.     [provider]  ?FLUTICASONE PROPIONATE, NASAL, NA Place 2 Inhalers into the nose daily.    [provider]  ?levothyroxine (SYNTHROID, LEVOTHROID) 88 MCG tablet Take 88 mcg by mouth daily before breakfast.    [provider]  ?losartan (COZAAR) 50 MG tablet Take 1 tablet by mouth daily. 11/25/17   [provider]  ?metoprolol (LOPRESSOR) 50 MG tablet Take 50 mg by mouth 2 (two) times daily.    [provider]  ?OVER THE COUNTER MEDICATION CBD ointment    [provider]  ?oxyCODONE-acetaminophen (PERCOCET/ROXICET) 5-325 MG tablet Take 1 tablet by mouth as needed. 03/02/21   [provider]  ?rosuvastatin (CRESTOR) 20 MG tablet Take 20 mg by mouth daily. 11/05/19   [provider]  ?senna-docusate (SENOKOT S) 8.6-50 MG per tablet Take 1 tablet by mouth 2 (two) times daily. ?Patient taking differently: Take 1 tablet by mouth as needed. 01/11/14   Rolan Bucco, MD  ?zolpidem (AMBIEN) 10 MG tablet Take 1 tablet (10 mg total) by mouth at bedtime. 06/10/20   Dohmeier, Asencion Partridge, MD  ?  ? ?Family History  ?Problem Relation Age of Onset  ? Colon cancer Maternal Grandmother   ? Colon polyps Maternal Grandmother   ? Emphysema Mother   ? Esophageal cancer Neg Hx   ? Rectal cancer Neg Hx   ? Stomach cancer Neg Hx   ? ? ?Social History  ? ?Socioeconomic History  ? Marital status: Married  ?  Spouse name: Not on file  ? Number  of children: 8  ? Years of education: college  ? Highest education level: Not on file  ?Occupational History  ? Occupation: Retired   ? Occupation: [employer]   ?Tobacco Use  ? Smoking status: Former  ?  Types: Cigarettes  ?  Quit date: 1970  ?  Years since quitting: 53.3  ? Smokeless tobacco: Never  ? Tobacco comments:  ?  Quit 1971  ?Vaping Use  ? Vaping Use: Never used  ?Substance and Sexual Activity  ? Alcohol use: No  ? Drug use: Never  ? Sexual activity: Not Currently  ?Other Topics Concern  ? Not on file  ?Social History Narrative  ? Drinks about 1-2 caffeine beverages a day   ? ?Social Determinants of Health  ? ?Financial Resource Strain: Not on file  ?Food Insecurity: Not on file  ?Transportation Needs: Not on file  ?Physical Activity: Not on file  ?Stress: Not on file  ?Social Connections: Not on  file  ? ? ? ?Vital Signs: ?There were no vitals taken for this visit. ? ?Physical Exam ?Constitutional:   ?   General: He is not in acute distress. ?HENT:  ?   Head: Normocephalic and atraumatic.  ?   Mouth/Throat:  ?   Pharynx: Oropharynx is clear.  ?Pulmonary:  ?   Effort: Pulmonary effort is normal. No respiratory distress.  ?Abdominal:  ?   Palpations: Abdomen is soft.  ?Skin: ?   General: Skin is warm and dry.  ?   Comments: Skin surrounding drain catheter with mild redness and induration.  No drainage.  Not tender.  ?Neurological:  ?   General: No focal deficit present.  ?   Mental Status: He is alert.  ?Drain: 12 Fr catheter in place on right lateral abdomen, suture and stat-lock are intact.  In JP bulb is minimal serous OP.  ? ?Imaging: ?CT ABDOMEN WO CONTRAST ? ?Result Date: 01/12/2022 ?CLINICAL DATA:  Abdominal wall abscess status post percutaneous drainage 01/04/2022; concurrent core biopsy consistent with abscess, no evidence of malignancy. EXAM: CT ABDOMEN WITHOUT CONTRAST TECHNIQUE: Multidetector CT imaging of the abdomen was performed following the standard protocol without IV contrast. RADIATION DOSE  REDUCTION: This exam was performed according to the departmental dose-optimization program which includes automated exposure control, adjustment of the mA and/or kV according to patient size and/or use of

## 2022-01-18 DIAGNOSIS — K862 Cyst of pancreas: Secondary | ICD-10-CM | POA: Diagnosis not present

## 2022-01-18 DIAGNOSIS — I700 Atherosclerosis of aorta: Secondary | ICD-10-CM | POA: Diagnosis not present

## 2022-01-18 DIAGNOSIS — E039 Hypothyroidism, unspecified: Secondary | ICD-10-CM | POA: Diagnosis not present

## 2022-01-18 DIAGNOSIS — I10 Essential (primary) hypertension: Secondary | ICD-10-CM | POA: Diagnosis not present

## 2022-01-18 DIAGNOSIS — N184 Chronic kidney disease, stage 4 (severe): Secondary | ICD-10-CM | POA: Diagnosis not present

## 2022-01-18 DIAGNOSIS — L02211 Cutaneous abscess of abdominal wall: Secondary | ICD-10-CM | POA: Diagnosis not present

## 2022-01-18 DIAGNOSIS — R5383 Other fatigue: Secondary | ICD-10-CM | POA: Diagnosis not present

## 2022-01-21 DIAGNOSIS — E785 Hyperlipidemia, unspecified: Secondary | ICD-10-CM | POA: Diagnosis not present

## 2022-01-21 DIAGNOSIS — I10 Essential (primary) hypertension: Secondary | ICD-10-CM | POA: Diagnosis not present

## 2022-01-26 NOTE — Telephone Encounter (Signed)
Pt's wife called stating that they have not heard anything about him getting the new cpap machine. She spoke to [facility] and they informed her that they have not received an order for his cpap replacement. Please advise. ?

## 2022-01-26 NOTE — Telephone Encounter (Signed)
Orders were sent to the company back in Sept 2022. He was not eligible until feb 2023. They should have everything they need to get the pt set up. I have sent the message to the company to have them follow up on setting up with new machine.  ?

## 2022-01-27 ENCOUNTER — Encounter: Payer: Self-pay | Admitting: Neurology

## 2022-01-28 NOTE — Telephone Encounter (Signed)
[employer] has responded and they have what they need for the patient. They have to work on [employer] authorization. Caryl Pina will reach out to the patient to make him aware of this information. I asked if a new apt was needed since sept 2022 was last ov and she states that we should be good and that shouldn't be necessary.  ? ?" I will reach out to him. His insurance is going to require auth so it could take up to a week to get back." ? ?

## 2022-01-31 DIAGNOSIS — E039 Hypothyroidism, unspecified: Secondary | ICD-10-CM | POA: Diagnosis not present

## 2022-01-31 DIAGNOSIS — E785 Hyperlipidemia, unspecified: Secondary | ICD-10-CM | POA: Diagnosis not present

## 2022-01-31 DIAGNOSIS — I10 Essential (primary) hypertension: Secondary | ICD-10-CM | POA: Diagnosis not present

## 2022-02-08 ENCOUNTER — Ambulatory Visit: Payer: PPO | Admitting: Neurology

## 2022-02-09 ENCOUNTER — Telehealth: Payer: Self-pay | Admitting: Neurology

## 2022-02-09 DIAGNOSIS — G4733 Obstructive sleep apnea (adult) (pediatric): Secondary | ICD-10-CM | POA: Diagnosis not present

## 2022-02-09 NOTE — Telephone Encounter (Signed)
DME: [employer] ?Phone: [phone], press option 1 ?Fax: [phone]  ?  ?Set up 02/09/2022 ?A follow up clinical evaluation must occur between 03-12-22 and 05-10-22 ?Pt instructed to bring CPAP and power cord ?

## 2022-03-03 DIAGNOSIS — E039 Hypothyroidism, unspecified: Secondary | ICD-10-CM | POA: Diagnosis not present

## 2022-03-03 DIAGNOSIS — I129 Hypertensive chronic kidney disease with stage 1 through stage 4 chronic kidney disease, or unspecified chronic kidney disease: Secondary | ICD-10-CM | POA: Diagnosis not present

## 2022-03-03 DIAGNOSIS — F339 Major depressive disorder, recurrent, unspecified: Secondary | ICD-10-CM | POA: Diagnosis not present

## 2022-03-03 DIAGNOSIS — E785 Hyperlipidemia, unspecified: Secondary | ICD-10-CM | POA: Diagnosis not present

## 2022-03-12 DIAGNOSIS — G4733 Obstructive sleep apnea (adult) (pediatric): Secondary | ICD-10-CM | POA: Diagnosis not present

## 2022-03-19 ENCOUNTER — Ambulatory Visit: Payer: PPO | Admitting: Cardiology

## 2022-03-26 DIAGNOSIS — N2581 Secondary hyperparathyroidism of renal origin: Secondary | ICD-10-CM | POA: Diagnosis not present

## 2022-03-26 DIAGNOSIS — F331 Major depressive disorder, recurrent, moderate: Secondary | ICD-10-CM | POA: Diagnosis not present

## 2022-03-26 DIAGNOSIS — N1832 Chronic kidney disease, stage 3b: Secondary | ICD-10-CM | POA: Diagnosis not present

## 2022-03-26 DIAGNOSIS — Z87891 Personal history of nicotine dependence: Secondary | ICD-10-CM | POA: Diagnosis not present

## 2022-03-26 DIAGNOSIS — D692 Other nonthrombocytopenic purpura: Secondary | ICD-10-CM | POA: Diagnosis not present

## 2022-03-26 DIAGNOSIS — G4733 Obstructive sleep apnea (adult) (pediatric): Secondary | ICD-10-CM | POA: Diagnosis not present

## 2022-03-26 DIAGNOSIS — I129 Hypertensive chronic kidney disease with stage 1 through stage 4 chronic kidney disease, or unspecified chronic kidney disease: Secondary | ICD-10-CM | POA: Diagnosis not present

## 2022-03-29 DIAGNOSIS — Z85820 Personal history of malignant melanoma of skin: Secondary | ICD-10-CM | POA: Diagnosis not present

## 2022-03-29 DIAGNOSIS — D225 Melanocytic nevi of trunk: Secondary | ICD-10-CM | POA: Diagnosis not present

## 2022-03-29 DIAGNOSIS — C4441 Basal cell carcinoma of skin of scalp and neck: Secondary | ICD-10-CM | POA: Diagnosis not present

## 2022-03-29 DIAGNOSIS — L821 Other seborrheic keratosis: Secondary | ICD-10-CM | POA: Diagnosis not present

## 2022-03-29 DIAGNOSIS — D692 Other nonthrombocytopenic purpura: Secondary | ICD-10-CM | POA: Diagnosis not present

## 2022-03-29 DIAGNOSIS — D485 Neoplasm of uncertain behavior of skin: Secondary | ICD-10-CM | POA: Diagnosis not present

## 2022-04-09 ENCOUNTER — Ambulatory Visit: Payer: PPO | Admitting: Cardiology

## 2022-04-09 ENCOUNTER — Encounter: Payer: Self-pay | Admitting: Cardiology

## 2022-04-09 DIAGNOSIS — I10 Essential (primary) hypertension: Secondary | ICD-10-CM | POA: Insufficient documentation

## 2022-04-09 NOTE — Progress Notes (Deleted)
Primary Physician/Referring:  Dalton Organ., MD  Patient ID: Dalton Mitchell, male    DOB: 07/13/1946, 76 y.o.   MRN: [id number]  No chief complaint on file.  HPI:    Dalton Mitchell  is a 76 y.o. 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 Brett Fairy presents here for annual visit.   Patient was admitted to the hospital in [place], [place] on 02/24/2021 with acute cholecystitis.  Underwent ERCP and also cholecystectomy.  He has recuperated well.  States that since last office visit he has not had any further fatigue.  States that he is presently asymptomatic and has been fairly active.  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     Past Medical History:  Diagnosis Date   Adenomatous colon polyp    Allergy    seasonal   Arthritis    Chronic kidney disease, stage III (GFR 30-59 ml/min)    Depression    past in early 90s   GERD 02/24/2008   Gout    History of kidney stones    Hypothyroidism    Obesity (BMI 30-39.9)    Old inferior wall myocardial infarction    Sleep apnea    USES CPAP OCCASIONALLY   Unstable angina ([place]) 09/13/2013   Past Surgical History:  Procedure Laterality Date   COLONOSCOPY  12/25/2014   [place] previously   [facility]   CORONARY ARTERY BYPASS GRAFT N/A 09/17/2013   Procedure: CORONARY ARTERY BYPASS GRAFTING (CABG);  Surgeon: Melrose Nakayama, MD;  Location: [facility];  Service: Open Heart Surgery;  Laterality: N/A;   CYSTOSCOPY WITH RETROGRADE PYELOGRAM, URETEROSCOPY AND STENT PLACEMENT Right 01/11/2014   Procedure: CYSTOSCOPY WITH RETROGRADE PYELOGRAM, URETEROSCOPY AND STENT PLACEMENT;   Surgeon: Molli Hazard, MD;  Location: [facility];  Service: Urology;  Laterality: Right;   CYSTOSCOPY WITH RETROGRADE PYELOGRAM, URETEROSCOPY AND STENT PLACEMENT Right 02/01/2014   Procedure: CYSTOSCOPY WITH RETROGRADE PYELOGRAM, URETEROSCOPY AND STENT EXCHANGE;  Surgeon: Molli Hazard, MD;  Location: [facility];  Service: Urology;  Laterality: Right;   EXPLORATION POST OPERATIVE OPEN HEART N/A 09/17/2013   Procedure: EXPLORATION POST OPERATIVE OPEN HEART;  Surgeon: Melrose Nakayama, MD;  Location: [facility];  Service: Open Heart Surgery;  Laterality: N/A;   HOLMIUM LASER APPLICATION Right 05/11/8675   Procedure: HOLMIUM LASER APPLICATION;  Surgeon: Molli Hazard, MD;  Location: [facility];  Service: Urology;  Laterality: Right;   IR RADIOLOGIST EVAL & MGMT  01/12/2022   LEFT HEART CATHETERIZATION WITH CORONARY ANGIOGRAM N/A 09/14/2013   Procedure: LEFT HEART CATHETERIZATION WITH CORONARY ANGIOGRAM;  Surgeon: Jacolyn Reedy, MD;  Location: [facility];  Service: Cardiovascular;  Laterality: N/A;   POLYPECTOMY     Family History  Problem Relation Age of Onset   Colon cancer Maternal Grandmother    Colon polyps Maternal Grandmother    Emphysema Mother    Esophageal cancer Neg Hx    Rectal cancer Neg Hx    Stomach cancer Neg Hx     Social History   Tobacco Use   Smoking status: Former    Types: Cigarettes    Quit date: 1970  Years since quitting: 53.5   Smokeless tobacco: Never   Tobacco comments:    Quit 1971  Substance Use Topics   Alcohol use: No   Marital Status: Married  ROS  Review of Systems  Constitutional: Negative for malaise/fatigue.  Cardiovascular:  Negative for dyspnea on exertion and leg swelling.  Respiratory:  Positive for snoring (not compliant with CPAP).   Gastrointestinal:  Negative for melena.  Psychiatric/Behavioral:  The patient has insomnia. The patient is not nervous/anxious.    Objective  There were no vitals taken for this visit.      01/12/2022    1:00 PM 01/04/2022    3:57 PM 01/04/2022    3:31 PM  Vitals with BMI  Systolic 115 520 802  Diastolic 78 89 74  Pulse 76 83 80     Physical Exam Constitutional:      Appearance: He is obese.     Comments: He is well-developed, moderately obese in no acute distress.  Neck:     Vascular: No carotid bruit or JVD.  Cardiovascular:     Rate and Rhythm: Normal rate and regular rhythm.     Pulses: Intact distal pulses.     Heart sounds: Normal heart sounds. No murmur heard.    No gallop.  Pulmonary:     Effort: Pulmonary effort is normal.     Breath sounds: Normal breath sounds.  Abdominal:     General: Bowel sounds are normal.     Palpations: Abdomen is soft.  Musculoskeletal:        General: Normal range of motion.     Right lower leg: No edema.  Neurological:     Mental Status: He is alert.    Laboratory examination:   Recent Labs    12/30/21 1415 12/30/21 2007 01/04/22 1155  NA 135 136 133*  K 4.5 4.1 4.0  CL 100 105 98  CO2 _0 GLUCOSE 124* 118* 155*  BUN 31* 30* 28*  CREATININE 2.77* 2.33* 2.26*  CALCIUM 9.6 8.0* 9.4  GFRNONAA 23* 29* 30*       Latest Ref Rng & Units 12/30/2021    8:07 PM 12/30/2021    2:15 PM 10/24/2021    3:33 AM  Hepatic Function  Total Protein 6.5 - 8.1 g/dL 5.6  7.4  7.4   Albumin 3.5 - 5.0 g/dL 2.3  3.1  3.5   AST 15 - 41 U/L 28  33  147   ALT 0 - 44 U/L 23  28  279   Alk Phosphatase 38 - 126 U/L 43  52  145   Total Bilirubin 0.3 - 1.2 mg/dL 0.6  0.8  0.9        Latest Ref Rng & Units 01/04/2022   11:55 AM 12/30/2021    8:07 PM 12/30/2021    1:38 PM  CBC  WBC 4.0 - 10.5 K/uL 11.0  8.3    Hemoglobin 13.0 - 17.0 g/dL 15.0  12.8  16.7   Hematocrit 39.0 - 52.0 % 45.6  40.1  49.0   Platelets 150 - 400 K/uL 269  188      External labs:  Labs 01/26/2022:  Hb 15.1/HCT 45.4, platelets 190.  Normal indicis.  BUN 13, creatinine 1.9, EGFR 34 mL.  Potassium 3.8.  LFTs normal.  Cholesterol, total 124.000 m  11/02/2021 HDL 32.000 mg 11/02/2021 LDL 77.000 mg 11/02/2021 Triglycerides 73.000 mg 11/02/2021  A1C 5.900 11/02/2021 TSH 0.710 01/18/2022  Medications and allergies  Allergies  Allergen Reactions   Morphine And Related Other (See Comments)    STATES HORRIBLE HALLUCINATIONS.   Ace Inhibitors Cough   Lisinopril Other (See Comments) and Cough     Current Outpatient Medications:    allopurinol (ZYLOPRIM) 300 MG tablet, Take 300 mg by mouth at bedtime., Disp: , Rfl:    aspirin EC 325 MG tablet, Take 325 mg by mouth at bedtime., Disp: , Rfl:    cetirizine (ZYRTEC) 10 MG tablet, Take 10 mg by mouth daily., Disp: , Rfl:    Coenzyme Q10 (COQ-10 PO), Take 100 mg by mouth daily., Disp: , Rfl:    escitalopram (LEXAPRO) 10 MG tablet, Take by mouth., Disp: , Rfl:    ezetimibe (ZETIA) 10 MG tablet, Take 10 mg by mouth daily., Disp: , Rfl:    famotidine (PEPCID) 20 MG tablet, Take 20 mg by mouth daily as needed. , Disp: , Rfl:    FLUTICASONE PROPIONATE, NASAL, NA, Place 2 Inhalers into the nose daily., Disp: , Rfl:    levothyroxine (SYNTHROID, LEVOTHROID) 88 MCG tablet, Take 88 mcg by mouth daily before breakfast., Disp: , Rfl:    losartan (COZAAR) 50 MG tablet, Take 1 tablet by mouth daily., Disp: , Rfl:    metoprolol (LOPRESSOR) 50 MG tablet, Take 50 mg by mouth 2 (two) times daily., Disp: , Rfl:    OVER THE COUNTER MEDICATION, CBD ointment, Disp: , Rfl:    oxyCODONE-acetaminophen (PERCOCET/ROXICET) 5-325 MG tablet, Take 1 tablet by mouth as needed., Disp: , Rfl:    rosuvastatin (CRESTOR) 20 MG tablet, Take 20 mg by mouth daily., Disp: , Rfl:    senna-docusate (SENOKOT S) 8.6-50 MG per tablet, Take 1 tablet by mouth 2 (two) times daily. (Patient taking differently: Take 1 tablet by mouth as needed.), Disp: 60 tablet, Rfl: 0   zolpidem (AMBIEN) 10 MG tablet, Take 1 tablet (10 mg total) by mouth at bedtime., Disp: 30 tablet, Rfl: 1    Radiology:    Cardiac Studies:   Echocardiogram 09/13/2013:    - Left ventricle: The cavity size was normal. Wall thickness  was increased in a pattern of mild LVH. Systolic function  was normal. The estimated ejection fraction was in the range of 50% to 55%. Wall motion was normal; there were no regional wall motion abnormalities. Doppler parameters are  consistent with abnormal left ventricular relaxation (grade 1 diastolic dysfunction).  - Left atrium: The atrium was mildly dilated.   Coronary angiogram 09/14/2013: Proximal LAD 80%, OM1 95% stenosis, proximal RCA subtotal stenosis.  CABG x 4 09/17/2013: Utilizing LIMA to LAD, SVG to OM2, and a sequential SVG to PDA and distal RCA.  Sleep Study 10/11/2016: 1. Mild Obstructive Sleep Apnea (OSA), mainly hypopnea. REM AHI was 32.2, making this apnea not suitable for mandibular advancement therapy.  2. Hypoxemia for 164 minutes.  3. 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 4. Snoring   Lexiscan (Walking with mod Bruce)Tetrofosmin Stress Test  02/06/2020: Nondiagnostic ECG stress. Mild degree medium extent perfusion defect consistent with mild (reversible) ischemia located in the mid inferolateral wall and basal inferolateral wall  (Left Circumflex Artery region) of left ventricle. Overall LV systolic function is normal without regional wall motion abnormalities. Stress LV EF: 63%.  No previous exam available for comparison. Low risk.   EKG  EKG 03/20/2021: Sinus rhythm at first-degree block at rate of 74 bpm, left axis deviation, left anterior fascicular block.  No evidence of ischemia, normal QT interval.  No  significant change from 09/18/2020.   Assessment     ICD-10-CM   1. Coronary artery disease involving native coronary artery of native heart without angina pectoris  I25.10     2. Stage 4 chronic kidney disease ([place])  N18.4     3. Hypercholesteremia  E78.00     4. Primary hypertension  I10      No orders of the defined types were placed in this encounter.    There are no discontinued medications.   Recommendations:   Dalton Mitchell  is a 76 y.o. Caucasian male with coronary artery disease, obesity, hypertension, hyperlipidemia, stage IV chronic kidney disease, history of inferior MI in 1995 SP balloon angioplasty to RCA, history of CABG x4 in 2014 or 2015, mild sleep apnea and recently compliant with CPAP, follows Dr. Brett Fairy presents here for annual visit.   Patient presented to the emergency room feeling poorly and confusional state, eventually led to diagnosis of abdominal wall abscess and had drainage placed 01/04/2022.  Patient was admitted to the hospital in [place], [place] on 02/24/2021 with acute cholecystitis.  Underwent ERCP and also cholecystectomy.  He has recuperated well.  States that since last office visit he has not had any further fatigue.  States that he is presently asymptomatic and has been fairly active.  Blood pressure is well controlled, external records reviewed, lipids in excellent control as well.  He has had no recurrence of angina pectoris or dyspnea on exertion and fatigue is resolved.  He has not had a baseline echocardiogram in several years, it was ordered today.  I reassured him to increase his physical activity, weight loss discussed, I will see him back on an annual basis.     Adrian Prows, MD, [facility] 04/09/2022, 7:25 AM Office: [phone] Pager: [phone].

## 2022-04-11 DIAGNOSIS — G4733 Obstructive sleep apnea (adult) (pediatric): Secondary | ICD-10-CM | POA: Diagnosis not present

## 2022-04-14 DIAGNOSIS — E291 Testicular hypofunction: Secondary | ICD-10-CM | POA: Diagnosis not present

## 2022-04-15 DIAGNOSIS — Z125 Encounter for screening for malignant neoplasm of prostate: Secondary | ICD-10-CM | POA: Diagnosis not present

## 2022-04-15 DIAGNOSIS — R7989 Other specified abnormal findings of blood chemistry: Secondary | ICD-10-CM | POA: Diagnosis not present

## 2022-04-15 DIAGNOSIS — E039 Hypothyroidism, unspecified: Secondary | ICD-10-CM | POA: Diagnosis not present

## 2022-04-15 DIAGNOSIS — I10 Essential (primary) hypertension: Secondary | ICD-10-CM | POA: Diagnosis not present

## 2022-04-15 DIAGNOSIS — R7301 Impaired fasting glucose: Secondary | ICD-10-CM | POA: Diagnosis not present

## 2022-04-15 DIAGNOSIS — R5382 Chronic fatigue, unspecified: Secondary | ICD-10-CM | POA: Diagnosis not present

## 2022-04-15 DIAGNOSIS — E785 Hyperlipidemia, unspecified: Secondary | ICD-10-CM | POA: Diagnosis not present

## 2022-04-15 DIAGNOSIS — M109 Gout, unspecified: Secondary | ICD-10-CM | POA: Diagnosis not present

## 2022-04-19 ENCOUNTER — Encounter: Payer: Self-pay | Admitting: Neurology

## 2022-04-19 ENCOUNTER — Ambulatory Visit (INDEPENDENT_AMBULATORY_CARE_PROVIDER_SITE_OTHER): Payer: PPO | Admitting: Neurology

## 2022-04-19 VITALS — BP 136/90 | HR 85 | Ht 69.0 in | Wt 240.0 lb

## 2022-04-19 DIAGNOSIS — G4734 Idiopathic sleep related nonobstructive alveolar hypoventilation: Secondary | ICD-10-CM | POA: Diagnosis not present

## 2022-04-19 DIAGNOSIS — I110 Hypertensive heart disease with heart failure: Secondary | ICD-10-CM

## 2022-04-19 DIAGNOSIS — Z9989 Dependence on other enabling machines and devices: Secondary | ICD-10-CM | POA: Diagnosis not present

## 2022-04-19 DIAGNOSIS — G4719 Other hypersomnia: Secondary | ICD-10-CM | POA: Diagnosis not present

## 2022-04-19 DIAGNOSIS — G4733 Obstructive sleep apnea (adult) (pediatric): Secondary | ICD-10-CM

## 2022-04-19 DIAGNOSIS — N184 Chronic kidney disease, stage 4 (severe): Secondary | ICD-10-CM | POA: Diagnosis not present

## 2022-04-19 NOTE — Progress Notes (Signed)
SLEEP MEDICINE CLINIC    Provider:  Larey Seat, MD  Primary Care Physician:  Dalton Mitchell., MD [address] [place] [address]     Referring Provider: Dr Dalton Gip, MD         Chief Complaint according to patient   Patient presents with:     New Patient (Initial Visit)     presents today to addres if pressures are set where need to be. DME [employer], machine set up 11/30/16  Chief concern according to patient : I am too sleepy, too fatigued to function, had lots of health problems in the lat 8 months.       HISTORY OF PRESENT ILLNESS:   04-19-2022;  Dalton Mitchell is a 76  Year-old Caucasian male patient seen here on 04/19/2022.  He has had some interims medical history- had visited a friend out-of-state ( [place]) and developed severe pain abdominal, emesis, vomiting, febrile-  gallbladder symptoms, surgery was needed and he had gangrenous gallbladder, developed peritonitis, November  2022, 2 months ago he had pain in the lower ribcage on the right- was diagnosed with an infection, there was fecal matter encapsulated in his abdomen. He is still part time landscaping: he is very fatigued, as well as sleepy . he stated his Epworth score was 12/ 24 points, but he does nap- daily- and more than once. He feels not like getting up and may nest in bed- some depression.  FSS at 45/ 63 points.  He feels better after using CPAP but he also dependent on sleep aids now . Revolving sleep aid- sleep onset before CPAP is in place. He wants to use a chin strap or mouth tape.  Compliance was lower than ever, 43 %, AHI 2.1/h, 95% pressure at 8.7 cm water- high leak.   The patient is well known to [place] and our sleep clinic. Here for yearly CPAP f/u. Pt reports his cpap power cord burned out and has not been able to use it. He is a father of 35 and has over 96 grand and great grandchildren.  Chief concern according to patient :  I am seeing Dalton Mitchell  today ,  06-10-2021, in a sleep clinic yearly follow up visit. He is in good mood,and talking fluently and without SOB. Travelled to [place] in summer of this year and developed some abdominal pain, he ws driving with his wife who convinced him to present to a local hospital,  He was hospitalized in June 22 in [place], had developed a gangrenous gallbladder, had robotic surgery. Chronic kidney disease, cardiac history, OSA with hypoxia.  He has w questions about INSPIRE device but he is not the right type of apnea ( hypoxia) and he has been too heavy.  His PAP machine recently puffed and smoked - he needs a new machine.   Last sleep study was just in 2021, his machine was reset , not replaced following this study,  Dalton Mitchell ordered the study. I reset to 7 through 16 cm water pressure. The machine 'died' in April 04, 2021. He has reached an AHI of 1.2/h but often remove the mask early, after 2-3 hours.  He needed 4 cm water pressure to hit the sweet spot.  ----    I am seeing Dalton Mitchell today, a  right -handed White or Caucasian male with a known sleep disorder.  He  has a past medical history of Adenomatous colon polyp, Allergy,  Arthritis, CAD, Chronic kidney disease, stage III (GFR 30-59 ml/min), Depression, GERD (02/24/2008), Gout, History of kidney stones, Hyperlipidemia, Hypertensive heart disease, Hypothyroidism, Obesity (BMI 30-39.9), Old inferior wall myocardial infarction, and Sleep apnea.  Dalton Mitchell is seen here on 10 June 2020 to follow-up on his most recent sleep study which took place on 15 June the patient's current CPAP machine could be set to auto titration mode    I have the pressure between 7 and 16 cmH2O and 1 cm EPR under the use of a ResMed F 20 large size fullface mask.  He did respond positively to 14 cmH2O pressure his previous baseline sleep study followed in over no was prolonged.  Of hypoxia while on CPAP.  AHI resolved to 0.0/h under 14 cmH2O  pressure sleep efficiency was 86%, and he is now meeting to see if he could adhere to this regimen.  He has used the machine 24 out of 30 days, average use at time is 4 hours 55 minutes, 69% he is using the machine over 4 hours but there were 4 days where he was under the 4-hour mark.  His residual AHI is excellent 2.2/h which is a significant reduction to baseline he does not have a significant central apnea problem with in his upper residual apneas, and his 95th percentile pressure was 13 cmH2O 95th percentile air leakage was at 33 L/min and this may be further reducible. Currently using an Airfit F10 with magnetic snaps.    The patient had the first sleep study in January of the year 2018: upon [facility] Irven Shelling referral .    Dalton Mitchell had his last sleep studies results in January 2018 on the eighth after being referred from [place]. Brigitte Mitchell his primary care physician he was diagnosed with a rather mild Mitchell of apnea with an AHI of 11.6, REM AHI was 32.2 making the use of positive airway pressure necessary.  Also the patient had significant oxygen desaturation during the sleep study he had 164 minutes with an oxygen saturation below 9089%.  He had also a lot of limb movements at night so we asked him to come back for a CPAP titration on 31 January his apnea was significantly reduced but we noticed cardiac arrhythmia, he is still had few PLM's at the beginning of the titration but his limb movements were much alleviated when he reached his final titration of 10 cmH2O with an AHI of 0.0 and his machine was prescribed as an AutoSet with a pressure pressure range between 7 and 12 cmH2O 1 cm expiratory pressure relief.  He has been 83% compliant using the machine 25 out of 30 days with an average on days used of 5 hours 28 minutes.  He reached an AHI residual of 1.8/h which is a good resolution.  There were no central apneas noted his 95th percentile pressure however is increased to 11.4 cmH2O which would correspond with  the highest amount of pressure allowed in his pressure window equal to 12 cm water.  He does have a lot of air leakage and I think we have to invest time in finding the best possible sealing mask for him.  I also would be happy to set up his pressure to a maximum of 13 rather with more expiratory pressure relief.  Dalton. Einar Mitchell mentioned that the patient was markedly more fatigued had dyspnea no energy and did not feel overall very well.  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  Current GFR is under 30 mL/min recent laboratories were quoted below,.     Social history: Patient is working as a Development worker, international aid and lives in a household with his spouse, he has a large family.  Family status is married , with 7 living  children, many grandchildren.  One son committed suicide. 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 . Pets are present. 1 cat.  Tobacco use- quit 1970.  ETOH use none , Caffeine intake in form of Coffee (none ) but rarely drinking sodas. Hobbies :gardening    Sleep habits are as follows: The patient's dinner time is between 5-6 PM. The patient goes to bed at 10-11 PM but he often can't sleep-  and continues to sleep for 3-4 hours, he wakes rarely for bathroom breaks, wakes often spontaneously at 3-4  AM.   The preferred sleep position is supine , with the support of 1-2 pillows. Dreams are reportedly  frequent/vivid.  5-6AM is the usual rise time. He reports not feeling refreshed or restored in AM, with symptoms such as dry mouth, but no morning headaches, and always residual fatigue. Naps are taken frequently, lasting from 30-60 minutes and are more refreshing than nocturnal sleep.    Review of Systems: Out of a complete 14 system review, the patient complains of only the following symptoms, and all other reviewed systems are  negative.:  Fatigue, sleepiness , snoring, fragmented sleep, Insomnia -    How likely are you to doze in the following situations: 0 = not likely, 1 = slight chance, 2 = moderate chance, 3 = high chance   Sitting and Reading? Watching Television? Sitting inactive in a public place (theater or meeting)? As a passenger in a car for an hour without a break? Lying down in the afternoon when circumstances permit? Sitting and talking to someone? Sitting quietly after lunch without alcohol? In a car, while stopped for a few minutes in traffic?   Total = 12/ 24 points   FSS endorsed at  45 from 60/ 63 points.   Severe Depression : 9/15 points  Social History   Socioeconomic History   Marital status: Married    Spouse name: Not on file   Number of children: 8   Years of education: college   Highest education level: Not on file  Occupational History   Occupation: Retired    Occupation: [employer]   Tobacco Use   Smoking status: Former    Types: Cigarettes    Quit date: 1970    Years since quitting: 53.5   Smokeless tobacco: Never   Tobacco comments:    Quit Engineer, civil (consulting) Use: Never used  Substance and Sexual Activity   Alcohol use: No   Drug use: Never   Sexual activity: Not Currently  Other Topics Concern   Not on file  Social History Narrative   Drinks about 1-2 caffeine beverages a day    Social Determinants of Radio broadcast assistant Strain: Not on file  Food Insecurity: Not on file  Transportation Needs: Not on file  Physical Activity: Not on file  Stress: Not on file  Social Connections: Not on file    Family History  Problem Relation Age of Onset   Colon cancer Maternal Grandmother    Colon polyps Maternal Grandmother    Emphysema Mother  Esophageal cancer Neg Hx    Rectal cancer Neg Hx    Stomach cancer Neg Hx     Past Medical History:  Diagnosis Date   Adenomatous colon polyp    Allergy    seasonal   Arthritis    Chronic  kidney disease, stage III (GFR 30-59 ml/min)    Depression    past in early 90s   GERD 02/24/2008   Gout    History of kidney stones    Hypothyroidism    Obesity (BMI 30-39.9)    Old inferior wall myocardial infarction    Sleep apnea    USES CPAP OCCASIONALLY   Unstable angina ([place]) 09/13/2013    Past Surgical History:  Procedure Laterality Date   COLONOSCOPY  12/25/2014   [place] previously   [place]   CORONARY ARTERY BYPASS GRAFT N/A 09/17/2013   Procedure: CORONARY ARTERY BYPASS GRAFTING (CABG);  Surgeon: Melrose Nakayama, MD;  Location: [facility];  Service: Open Heart Surgery;  Laterality: N/A;   CYSTOSCOPY WITH RETROGRADE PYELOGRAM, URETEROSCOPY AND STENT PLACEMENT Right 01/11/2014   Procedure: CYSTOSCOPY WITH RETROGRADE PYELOGRAM, URETEROSCOPY AND STENT PLACEMENT;  Surgeon: Molli Hazard, MD;  Location: [facility];  Service: Urology;  Laterality: Right;   CYSTOSCOPY WITH RETROGRADE PYELOGRAM, URETEROSCOPY AND STENT PLACEMENT Right 02/01/2014   Procedure: CYSTOSCOPY WITH RETROGRADE PYELOGRAM, URETEROSCOPY AND STENT EXCHANGE;  Surgeon: Molli Hazard, MD;  Location: [facility];  Service: Urology;  Laterality: Right;   EXPLORATION POST OPERATIVE OPEN HEART N/A 09/17/2013   Procedure: EXPLORATION POST OPERATIVE OPEN HEART;  Surgeon: Melrose Nakayama, MD;  Location: [facility];  Service: Open Heart Surgery;  Laterality: N/A;   HOLMIUM LASER APPLICATION Right 12/09/9022   Procedure: HOLMIUM LASER APPLICATION;  Surgeon: Molli Hazard, MD;  Location: [facility];  Service: Urology;  Laterality: Right;   IR RADIOLOGIST EVAL & MGMT  01/12/2022   LEFT HEART CATHETERIZATION WITH CORONARY ANGIOGRAM N/A 09/14/2013   Procedure: LEFT HEART CATHETERIZATION WITH CORONARY ANGIOGRAM;  Surgeon: Jacolyn Reedy, MD;  Location[facility];  Service: Cardiovascular;  Laterality: N/A;   POLYPECTOMY       Current Outpatient  Medications on File Prior to Visit  Medication Sig Dispense Refill   allopurinol (ZYLOPRIM) 300 MG tablet Take 300 mg by mouth at bedtime.     aspirin EC 325 MG tablet Take 325 mg by mouth at bedtime.     cetirizine (ZYRTEC) 10 MG tablet Take 10 mg by mouth daily.     Coenzyme Q10 (COQ-10 PO) Take 100 mg by mouth daily.     escitalopram (LEXAPRO) 10 MG tablet Take by mouth.     ezetimibe (ZETIA) 10 MG tablet Take 10 mg by mouth daily.     FLUTICASONE PROPIONATE, NASAL, NA Place 2 Inhalers into the nose daily.     levothyroxine (SYNTHROID, LEVOTHROID) 88 MCG tablet Take 88 mcg by mouth daily before breakfast.     metoprolol (LOPRESSOR) 50 MG tablet Take 50 mg by mouth 2 (two) times daily.     OVER THE COUNTER MEDICATION CBD ointment     oxyCODONE-acetaminophen (PERCOCET/ROXICET) 5-325 MG tablet Take 1 tablet by mouth as needed.     rosuvastatin (CRESTOR) 20 MG tablet Take 20 mg by mouth daily.     zolpidem (AMBIEN) 10 MG tablet Take 1 tablet (10 mg total) by mouth at bedtime. 30 tablet 1   No current facility-administered medications  on file prior to visit.    Allergies  Allergen Reactions   Morphine And Related Other (See Comments)    STATES HORRIBLE HALLUCINATIONS.   Ace Inhibitors Cough   Lisinopril Other (See Comments) and Cough    Physical exam:  Today's Vitals   04/19/22 1548  BP: 136/90  Mitchell: 85  Weight: 240 lb (108.9 kg)  Height: '5\' 9"'$  (1.753 m)   Body mass index is 35.44 kg/m.   Wt Readings from Last 3 Encounters:  04/19/22 240 lb (108.9 kg)  12/30/21 250 lb (113.4 kg)  10/24/21 275 lb 9.2 oz (125 kg)     Ht Readings from Last 3 Encounters:  04/19/22 '5\' 9"'$  (1.753 m)  12/30/21 '5\' 9"'$  (1.753 m)  10/24/21 '5\' 9"'$  (1.753 m)      General: The patient is awake, alert and appears not in acute distress. The patient is well groomed. Head: Normocephalic, atraumatic. Neck is supple. Mallampati 3,  neck circumference: 19 inches- unchanged for years .  Dental status:  no dentures.  Cardiovascular:  Regular rate and cardiac rhythm by Mitchell, without distended neck veins. Respiratory: Lungs are clear to auscultation, but restricted capacity.  Skin:  With evidence of ankle edema, or rash. Trunk: The patient's posture is erect. BMI 36.18.    Neurologic exam : The patient is awake and alert, oriented to place and time.   Memory subjective described as intact. He o holds a fluent conversation.  Attention span & concentration ability appears normal.  Speech is fluent,  without dysarthria, with mild dysphonia.  Mood and affect are appropriate.   Cranial nerves: no loss of smell or taste reported  Pupils are equal and briskly reactive to light. Funduscopic exam deferred. .  Extraocular movements in vertical and horizontal planes were intact and without nystagmus. No Diplopia. Visual fields by finger perimetry are intact. Hearing was intact to soft voice.    Facial motor strength is symmetric and tongue and uvula move midline.  Neck ROM : rotation, tilt and flexion extension were normal for age and shoulder shrug was symmetrical.    Motor exam:  Symmetric bulk, tone and ROM.   Normal tone without cog-wheeling, symmetric grip strength . Sensory:  Fine touch  and vibration were not tested today.  Proprioception tested in the upper extremities was normal. Coordination: Rapid alternating movements in the fingers/hands were of normal speed.  The Finger-to-nose maneuver was intact without evidence of ataxia, dysmetria or tremor. Mild pronator drift.  Gait and station: Patient could rise unassisted from a seated position, walked without assistive device. He needs to brace himself to rise.  Stance is of normal width/ base .  Toe and heel walk were deferred.  Deep tendon reflexes: in the upper and lower extremities are symmetric and intact.  Babinski response was deferred.      After spending a total time of 25 minutes face to face and for physical and neurologic  examination, review of laboratory studies,  personal review of imaging studies, reports and results of other testing and review of referral information / records as far as provided in visit, I have established the following assessments: 1)apnea - his PAP machine works well for his apnea - he may benefit from more pressure, but the residual AHI is low - air leaks remain but mask is comfortable, I will let it be. Machine was set up in Spring, 5-3- 2023.  He dislikes the smaller water container.  He would like a higher pressure setting -  will change to 7 through 17 cm water pressure-, keep 3 cm epr.  FFM. 2) depression, underlying- this contributes to fatigue.   3) his CKD  will certainly contribute to fatigue.   My Plan is to proceed with:  Improving compliance with a chin strap-   In short, Dalton Mitchell is presenting with excessive fatigue , a symptom that can be attributed to many of his chronic conditions, but seem not related to apnea per se.    I plan to follow up either personally or through our NP within 3 month.   CC [place] Gip, MD     Electronically signed by: Dalton Seat, MD 04/19/2022 4:21 PM  [facility] Board certified by The [employer] of Sleep Medicine and Diplomate of the [employer] of Sleep Medicine. Board certified In Neurology through the [employer], Fellow of the [employer] of Neurology. Medical Director of [facility].

## 2022-04-19 NOTE — Patient Instructions (Signed)
Please re-educate for  compliance . Maday Guarino, MD  KEEPING IT CLEAN: CPAP HYGIENE PROPER UPKEEP OF YOUR CPAP MACHINE CAN HELP ENSURE THE DEVICE FUNCTIONS PROPERLY CPAP CLEANING INSTRUCTIONS Along with proper CPAP cleaning it is recommended that you replace your mask, tubing and filters once very 3 months and more frequently if you are sick.   DAILY CLEANING Do not use moisturizing soaps, bleach, scented oils, chlorine, or alcohol-based solutions to clean your supplies. These solutions may cause irritation to your skin and lungs and may reduce the life of your products. [employer] or Comparable works best for daily cleaning.  **If you've been sick, it's smart to wash your mask, tubing, humidifier and filter daily until your cold, flu or virus symptoms are gone. That can help reduce the amount of time you spend under the weather.  Before using your mask -wash your face daily with soap and water to remove excess facial oils. Wipe down your mask (including areas that come in contact with your skin) using a damp towel with soap and warm water. This will remove any oils, dead skin cells, and sweat on the mask that can affect the quality of the seal. Gently rinse with a clean towel and let the mask air-dry out of direct sunlight. You can also use unscented baby wipes or pre-moistened towels designed specifically for cleaning CPAP masks, which are available on-line. DO NOT USE CLOROX OR DISINFECTING WIPES. If your unit has a humidifier, empty any leftover water instead of letting in sit in the unit all day. Refill the humidifier with clean, distilled water right before bedtime for optimal use WEEKLY (OR MORE FREQUENT) CLEANING Your mask and tubing need a full bath at least once a week to keep it free of dust, bacteria, and germs. (During COVID-19 or any other flu/virus we recommend more frequent cleaning) Clean the CPAP tubing, nasal mask, and headgear in a bathroom sink filled with warm water  and a few drops of ammonia-free, mild dish detergent. Avoid using stronger cleaning products, as they may damage the mask or leave harmful residue. Swirl all parts around for about five minutes, rinse well and let air dry during the day. Hang the tubing over the shower rod, on a towel rack or in the laundry room to ensure all the water drips out. The mask and headgear can be air-dried on a towel or hung on a hook or hanger. You should also wipe down your CPAP machine with a damp cloth. Ensure the unit is unplugged. The towel shouldn't be too damp or wet, as water could get into the machine. Clean the filter by removing it and rinsing it in warm tap water. Run it under the water and squeeze to make sure there is no dust. Then blot down the filter with a towel. Do not wash your machine's white filter, if one is present--those are disposable and should be replaced every two weeks. If you are recovering from being sick, we recommend changing the filter sooner. If your CPAP has a humidifier, that also needs to be cleaned weekly. Empty any remaining water and then wash the water chamber in the sink with warm soapy water. Rinse well and drain out as much of the water as possible. Let the chamber air-dry before placing it back into the CPAP unit. Every other week you should disinfect the humidifier. Do that by soaking it in a solution of one-part vinegar to five parts water for 30 minutes, thoroughly rinsing and then placing  in your dishwasher's top rack for washing. And keep it clean by using only distilled water to prevent mineral deposits that can build up and cause damage to your machine. IMPORTANT TIPS Make caring for your CPAP equipment part of your morning routine. Keep machine and accessories out of direct sunlight to avoid damaging them. Never use bleach to clean accessories. Place machine on a level surface and away from curtains that may interfere with the air intake. Keep track of when you should  order replacement parts for your mask and accessories so that you always get the most out of your CPAP. You can also sign up for Auto Supply by contacting our DME department at [email or web address] **The following are examples of soap that may be used: [employer], [place] soap (plain).  With a little upkeep, your CPAP can continue to help you breathe better for a long time. Just a few minutes a day can help keep your CPAP running efficiently for years to come.  If you have a CPAP, but are struggling with compliance, check out our no mask oral appliance, for those with mild to moderate sleep apnea.  Call and schedule a consultation with one of our sleep medicine physicians, or ask your doctor about a sleep referral to the [place].

## 2022-04-20 NOTE — Progress Notes (Signed)
CM sent to [facility] for new order ?

## 2022-04-21 DIAGNOSIS — R053 Chronic cough: Secondary | ICD-10-CM | POA: Diagnosis not present

## 2022-04-21 DIAGNOSIS — N401 Enlarged prostate with lower urinary tract symptoms: Secondary | ICD-10-CM | POA: Diagnosis not present

## 2022-04-21 DIAGNOSIS — G4733 Obstructive sleep apnea (adult) (pediatric): Secondary | ICD-10-CM | POA: Diagnosis not present

## 2022-04-21 DIAGNOSIS — F339 Major depressive disorder, recurrent, unspecified: Secondary | ICD-10-CM | POA: Diagnosis not present

## 2022-04-21 DIAGNOSIS — R82998 Other abnormal findings in urine: Secondary | ICD-10-CM | POA: Diagnosis not present

## 2022-04-21 DIAGNOSIS — Z0000 Encounter for general adult medical examination without abnormal findings: Secondary | ICD-10-CM | POA: Diagnosis not present

## 2022-04-21 DIAGNOSIS — M4800 Spinal stenosis, site unspecified: Secondary | ICD-10-CM | POA: Diagnosis not present

## 2022-04-21 DIAGNOSIS — N184 Chronic kidney disease, stage 4 (severe): Secondary | ICD-10-CM | POA: Diagnosis not present

## 2022-04-21 DIAGNOSIS — I10 Essential (primary) hypertension: Secondary | ICD-10-CM | POA: Diagnosis not present

## 2022-04-21 DIAGNOSIS — E039 Hypothyroidism, unspecified: Secondary | ICD-10-CM | POA: Diagnosis not present

## 2022-04-21 DIAGNOSIS — R7301 Impaired fasting glucose: Secondary | ICD-10-CM | POA: Diagnosis not present

## 2022-04-21 DIAGNOSIS — I129 Hypertensive chronic kidney disease with stage 1 through stage 4 chronic kidney disease, or unspecified chronic kidney disease: Secondary | ICD-10-CM | POA: Diagnosis not present

## 2022-04-21 DIAGNOSIS — E785 Hyperlipidemia, unspecified: Secondary | ICD-10-CM | POA: Diagnosis not present

## 2022-04-23 DIAGNOSIS — M170 Bilateral primary osteoarthritis of knee: Secondary | ICD-10-CM | POA: Diagnosis not present

## 2022-05-03 DIAGNOSIS — N1832 Chronic kidney disease, stage 3b: Secondary | ICD-10-CM | POA: Diagnosis not present

## 2022-05-03 DIAGNOSIS — N200 Calculus of kidney: Secondary | ICD-10-CM | POA: Diagnosis not present

## 2022-05-03 DIAGNOSIS — N2581 Secondary hyperparathyroidism of renal origin: Secondary | ICD-10-CM | POA: Diagnosis not present

## 2022-05-03 DIAGNOSIS — N2889 Other specified disorders of kidney and ureter: Secondary | ICD-10-CM | POA: Diagnosis not present

## 2022-05-03 DIAGNOSIS — N400 Enlarged prostate without lower urinary tract symptoms: Secondary | ICD-10-CM | POA: Diagnosis not present

## 2022-05-03 DIAGNOSIS — I129 Hypertensive chronic kidney disease with stage 1 through stage 4 chronic kidney disease, or unspecified chronic kidney disease: Secondary | ICD-10-CM | POA: Diagnosis not present

## 2022-05-04 ENCOUNTER — Encounter: Payer: Self-pay | Admitting: Cardiology

## 2022-05-04 ENCOUNTER — Ambulatory Visit: Payer: PPO | Admitting: Cardiology

## 2022-05-04 VITALS — BP 151/100 | HR 69 | Temp 98.0°F | Resp 16 | Ht 69.0 in | Wt 241.4 lb

## 2022-05-04 DIAGNOSIS — I2510 Atherosclerotic heart disease of native coronary artery without angina pectoris: Secondary | ICD-10-CM

## 2022-05-04 DIAGNOSIS — N1832 Chronic kidney disease, stage 3b: Secondary | ICD-10-CM

## 2022-05-04 DIAGNOSIS — G4733 Obstructive sleep apnea (adult) (pediatric): Secondary | ICD-10-CM

## 2022-05-04 DIAGNOSIS — I10 Essential (primary) hypertension: Secondary | ICD-10-CM | POA: Diagnosis not present

## 2022-05-04 DIAGNOSIS — Z9989 Dependence on other enabling machines and devices: Secondary | ICD-10-CM | POA: Diagnosis not present

## 2022-05-04 MED ORDER — LOSARTAN POTASSIUM 25 MG PO TABS: 25.0000 mg | ORAL_TABLET | Freq: Every evening | ORAL | 2 refills | Status: DC

## 2022-05-04 MED ORDER — METOPROLOL TARTRATE 25 MG PO TABS: 25.0000 mg | ORAL_TABLET | Freq: Every evening | ORAL | 3 refills | Status: DC

## 2022-05-04 NOTE — Progress Notes (Signed)
Primary Physician/Referring:  Ginger Organ., MD  Patient ID: Dalton Mitchell, male    DOB: 06/22/1946, 76 y.o.   MRN: [id number]  Chief Complaint  Patient presents with   Hypertension   Hyperlipidemia   Coronary Artery Disease   Follow-up    1 year    HPI:    Dalton Mitchell  is a 75 y.o. 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 Brett Fairy.  Patient was admitted to the hospital in [place], [place] on 02/24/2021 with acute cholecystitis.  Underwent ERCP and also cholecystectomy. Patient presented to the emergency room feeling poorly and confusional state, eventually led to diagnosis of abdominal wall abscess and was infected with colonic bacteria and had drainage placed 01/04/2022.  He had developed acute renal failure, and significant comorbidity.  He has finally recuperated from this.  This is a 53-monthoffice visit.  Since the medications were discontinued, during his sepsis and recovery, his blood pressure has been elevated.  He is now being followed by Dr. LHarrie Jeansfor nephrology.  He is accompanied by his wife.  Past Medical History:  Diagnosis Date   Adenomatous colon polyp    Allergy    seasonal   Arthritis    Chronic kidney disease, stage III (GFR 30-59 ml/min)    Depression    past in early 90s   GERD 02/24/2008   Gout    History of kidney stones    Hypothyroidism    Obesity (BMI 30-39.9)    Old inferior wall myocardial infarction    Sleep apnea    USES CPAP OCCASIONALLY   [place]   Social History   Tobacco Use   Smoking status: Former    Packs/day: 1.00    Years: 4.00    Total pack years: 4.00    Types: Cigarettes    Quit date: 1970    Years since quitting: 53.6   Smokeless tobacco:  Never   Tobacco comments:    Quit 1971  Substance Use Topics   Alcohol use: No   Marital Status: Married  ROS  Review of Systems  Cardiovascular:  Negative for chest pain, dyspnea on exertion and leg swelling.  Respiratory:  Positive for snoring (compliant with CPAP).    Objective  Blood pressure (!) 151/100, pulse 69, temperature 98 F (36.7 C), resp. rate 16, height 5' 9"  (1.753 m), weight 241 lb 6.4 oz (109.5 kg), SpO2 96 %.     05/04/2022    2:30 PM 05/04/2022    2:19 PM 04/19/2022    3:48 PM  Vitals with BMI  Height  5' 9"  5' 9"   Weight  241 lbs 6 oz 240 lbs  BMI  357.26320.35 Systolic [id number] Diastolic 153690 90  Pulse 69 64 85     Physical Exam Constitutional:      Appearance: He is obese.     Comments: He is well-developed, moderately obese in no acute distress.  Neck:     Vascular: No carotid bruit or JVD.  Cardiovascular:     Rate and Rhythm: Normal rate and regular rhythm.     Pulses: Intact distal pulses.     Heart sounds: Normal heart sounds. No murmur heard.    No  gallop.  Pulmonary:     Effort: Pulmonary effort is normal.     Breath sounds: Normal breath sounds.  Abdominal:     General: Bowel sounds are normal.     Palpations: Abdomen is soft.  Musculoskeletal:     Right lower leg: No edema.     Left lower leg: No edema.    Laboratory examination:   Recent Labs    12/30/21 1415 12/30/21 2007 01/04/22 1155  NA 135 136 133*  K 4.5 4.1 4.0  CL 100 105 98  CO2 25 23 24   GLUCOSE 124* 118* 155*  BUN 31* 30* 28*  CREATININE 2.77* 2.33* 2.26*  CALCIUM 9.6 8.0* 9.4  GFRNONAA 23* 29* 30*      Latest Ref Rng & Units 12/30/2021    8:07 PM 12/30/2021    2:15 PM 10/24/2021    3:33 AM  Hepatic Function  Total Protein 6.5 - 8.1 g/dL 5.6  7.4  7.4   Albumin 3.5 - 5.0 g/dL 2.3  3.1  3.5   AST 15 - 41 U/L 28  33  147   ALT 0 - 44 U/L 23  28  279   Alk Phosphatase 38 - 126 U/L 43  52  145   Total Bilirubin 0.3 - 1.2 mg/dL 0.6  0.8  0.9         Latest Ref Rng & Units 01/04/2022   11:55 AM 12/30/2021    8:07 PM 12/30/2021    1:38 PM  CBC  WBC 4.0 - 10.5 K/uL 11.0  8.3    Hemoglobin 13.0 - 17.0 g/dL 15.0  12.8  16.7   Hematocrit 39.0 - 52.0 % 45.6  40.1  49.0   Platelets 150 - 400 K/uL 269  188      External labs:  Labs 04/15/2022:  A1c 6.0%.  TSH 3.26, normal.  Free T4 normal.  Total cholesterol 147, triglycerides 156, HDL 55, LDL 61.  Non-HDL cholesterol 92.  Serum glucose 1 4 mg, BUN 24, serum creatinine 1.7, EGFR 39 mL, potassium 4.5, LFTs normal.  Uric acid 6.2, normal.  Hb 17.3/HCT 50.7, platelets 206, normal indicis.  Labs 01/26/2022:  Hb 15.1/HCT 45.4, platelets 190.  Normal indicis.  BUN 13, creatinine 1.9, EGFR 34 mL.  Potassium 3.8.  LFTs normal.  Medications and allergies   Allergies  Allergen Reactions   Morphine And Related Other (See Comments)    STATES HORRIBLE HALLUCINATIONS.   Ace Inhibitors Cough   Lisinopril Other (See Comments) and Cough     Current Outpatient Medications:    allopurinol (ZYLOPRIM) 300 MG tablet, Take 300 mg by mouth at bedtime., Disp: , Rfl:    ALPRAZolam (XANAX) 0.5 MG tablet, Take 1 tablet by mouth as needed., Disp: , Rfl:    aspirin EC 325 MG tablet, Take 325 mg by mouth at bedtime., Disp: , Rfl:    cetirizine (ZYRTEC) 10 MG tablet, Take 10 mg by mouth daily., Disp: , Rfl:    Coenzyme Q10 (COQ-10 PO), Take 100 mg by mouth daily., Disp: , Rfl:    Dextromethorphan-guaiFENesin 10-200 MG/15ML LIQD, Take by mouth., Disp: , Rfl:    escitalopram (LEXAPRO) 10 MG tablet, Take by mouth., Disp: , Rfl:    ezetimibe (ZETIA) 10 MG tablet, Take 10 mg by mouth daily., Disp: , Rfl:    famotidine (PEPCID) 20 MG tablet, Take 20 mg by mouth 2 (two) times daily., Disp: , Rfl:    FLUTICASONE PROPIONATE, NASAL, NA, Place 2  Inhalers into the nose daily., Disp: , Rfl:    Levothyroxine Sodium 100 MCG CAPS, Take 100 mcg by mouth daily before breakfast., Disp: , Rfl:    losartan (COZAAR) 25 MG  tablet, Take 1 tablet (25 mg total) by mouth every evening., Disp: 30 tablet, Rfl: 2   oxyCODONE-acetaminophen (PERCOCET/ROXICET) 5-325 MG tablet, Take 1 tablet by mouth as needed., Disp: , Rfl:    Probiotic Product (PROBIOTIC-10 PO), Take by mouth., Disp: , Rfl:    rosuvastatin (CRESTOR) 20 MG tablet, Take 20 mg by mouth daily., Disp: , Rfl:    tamsulosin (FLOMAX) 0.4 MG CAPS capsule, Take 0.4 mg by mouth daily after supper., Disp: , Rfl:    zolpidem (AMBIEN) 10 MG tablet, Take 1 tablet (10 mg total) by mouth at bedtime., Disp: 30 tablet, Rfl: 1   metoprolol tartrate (LOPRESSOR) 25 MG tablet, Take 1 tablet (25 mg total) by mouth every evening., Disp: 180 tablet, Rfl: 3   OVER THE COUNTER MEDICATION, CBD ointment, Disp: , Rfl:     Radiology:    Cardiac Studies:   Coronary angiogram 09/14/2013: Proximal LAD 80%, OM1 95% stenosis, proximal RCA subtotal stenosis.  CABG x 4 09/17/2013: Utilizing LIMA to LAD, SVG to OM2, and a sequential SVG to PDA and distal RCA.  Lexiscan (Walking with mod Bruce)Tetrofosmin Stress Test  02/06/2020: Nondiagnostic ECG stress. Mild degree medium extent perfusion defect consistent with mild (reversible) ischemia located in the mid inferolateral wall and basal inferolateral wall  (Left Circumflex Artery region) of left ventricle. Overall LV systolic function is normal without regional wall motion abnormalities. Stress LV EF: 63%.  No previous exam available for comparison. Low risk.   Echocardiogram 03/31/2021: Technically difficult study with suboptimal images. Left ventricle cavity is normal in size and wall thickness. Normal global wall motion. Normal LV systolic function with EF 61%. Doppler evidence of grade I (impaired) diastolic dysfunction, normal LAP.  No significant valvular abnormality. IVC not seen.  EKG  EKG 05/23/2022: Normal sinus rhythm with rate of 66 bpm, normal axis, incomplete right bundle branch block.  Poor R progression, cannot exclude  anteroseptal infarct old.  No evidence of ischemia.  Normal QT interval.  EKG 03/20/2021: Sinus rhythm at first-degree block at rate of 74 bpm, left axis deviation, left anterior fascicular block.  No evidence of ischemia, normal QT interval.  No significant change from 09/18/2020.   Assessment     ICD-10-CM   1. Coronary artery disease involving native coronary artery of native heart without angina pectoris  I25.10 EKG 12-Lead    metoprolol tartrate (LOPRESSOR) 25 MG tablet    2. Primary hypertension  E17 Basic metabolic panel    3. Obstructive sleep apnea on CPAP  G47.33    Z99.89     4. Stage 3b chronic kidney disease (HCC)  N18.32 losartan (COZAAR) 25 MG tablet    Basic metabolic panel      Meds ordered this encounter  Medications   metoprolol tartrate (LOPRESSOR) 25 MG tablet    Sig: Take 1 tablet (25 mg total) by mouth every evening.    Dispense:  180 tablet    Refill:  3   losartan (COZAAR) 25 MG tablet    Sig: Take 1 tablet (25 mg total) by mouth every evening.    Dispense:  30 tablet    Refill:  2    Medications Discontinued During This Encounter  Medication Reason   metoprolol (LOPRESSOR) 50 MG tablet Reorder     Recommendations:  JAYKE CAUL  is a 76 y.o. Caucasian male with coronary artery disease S/P CABG 2014, obesity, hypertension, hyperlipidemia, stage IV chronic kidney disease, history of inferior MI in 1995 SP balloon angioplasty to RCA, history of CABG x4 in 2014 or 2015, mild sleep apnea compliant with CPAP, follows Dr. Brett Fairy.  Patient was admitted to the hospital in [place], [place] on 02/24/2021 with acute cholecystitis.  Underwent ERCP and also cholecystectomy. Patient presented to the emergency room feeling poorly and confusional state, eventually led to diagnosis of abdominal wall abscess and was infected with colonic bacteria and had drainage placed 01/04/2022.  He had developed acute renal failure, and significant comorbidity.  He has finally  recuperated from this.  This is a 35-monthoffice visit.  As his medications were changed and he was not on lisinopril and also metoprolol dose was held, he has noticed blood pressure to be elevated, his wife had started 1/2 tablet of 50 mg.  Will increase to 25 mg p.o. twice daily.  I reviewed his recent labs, serum creatinine has improved and stabilized at stage IIIb chronic kidney disease.  Will add losartan 25 mg in the evening for both hypertension and cardiovascular protection and renal protection, will obtain BMP in 2 to 3 weeks.  From cardiac standpoint he has had no heart failure, no recurrence of angina pectoris.  He is gradually losing weight.  I encouraged him to continue to do so.  I would like to see him back in 6 weeks for follow-up specifically for hypertension.  Lipids are well controlled.  External labs reviewed.    JAdrian Prows MD, [facility]8/10/2021, 3:00 PM Office: [phone]: [phone].

## 2022-05-12 DIAGNOSIS — Z85820 Personal history of malignant melanoma of skin: Secondary | ICD-10-CM | POA: Diagnosis not present

## 2022-05-12 DIAGNOSIS — C4441 Basal cell carcinoma of skin of scalp and neck: Secondary | ICD-10-CM | POA: Diagnosis not present

## 2022-05-12 DIAGNOSIS — G4733 Obstructive sleep apnea (adult) (pediatric): Secondary | ICD-10-CM | POA: Diagnosis not present

## 2022-05-12 DIAGNOSIS — Z85828 Personal history of other malignant neoplasm of skin: Secondary | ICD-10-CM | POA: Diagnosis not present

## 2022-05-14 DIAGNOSIS — N1832 Chronic kidney disease, stage 3b: Secondary | ICD-10-CM | POA: Diagnosis not present

## 2022-05-20 LAB — LAB REPORT - SCANNED: EGFR: 36

## 2022-05-26 DIAGNOSIS — I10 Essential (primary) hypertension: Secondary | ICD-10-CM | POA: Diagnosis not present

## 2022-05-26 DIAGNOSIS — N1832 Chronic kidney disease, stage 3b: Secondary | ICD-10-CM | POA: Diagnosis not present

## 2022-05-26 DIAGNOSIS — L570 Actinic keratosis: Secondary | ICD-10-CM | POA: Diagnosis not present

## 2022-05-27 LAB — BASIC METABOLIC PANEL
BUN/Creatinine Ratio: 14 (ref 10–24)
BUN: 26 mg/dL (ref 8–27)
CO2: 25 mmol/L (ref 20–29)
Calcium: 10.6 mg/dL — ABNORMAL HIGH (ref 8.6–10.2)
Chloride: 104 mmol/L (ref 96–106)
Creatinine, Ser: 1.91 mg/dL — ABNORMAL HIGH (ref 0.76–1.27)
Glucose: 138 mg/dL — ABNORMAL HIGH (ref 70–99)
Potassium: 5.1 mmol/L (ref 3.5–5.2)
Sodium: 143 mmol/L (ref 134–144)
eGFR: 36 mL/min/{1.73_m2} — ABNORMAL LOW (ref >59)

## 2022-06-04 DIAGNOSIS — N184 Chronic kidney disease, stage 4 (severe): Secondary | ICD-10-CM | POA: Diagnosis not present

## 2022-06-04 DIAGNOSIS — R5382 Chronic fatigue, unspecified: Secondary | ICD-10-CM | POA: Diagnosis not present

## 2022-06-04 DIAGNOSIS — R7989 Other specified abnormal findings of blood chemistry: Secondary | ICD-10-CM | POA: Diagnosis not present

## 2022-06-04 DIAGNOSIS — M791 Myalgia, unspecified site: Secondary | ICD-10-CM | POA: Diagnosis not present

## 2022-06-08 DIAGNOSIS — D4101 Neoplasm of uncertain behavior of right kidney: Secondary | ICD-10-CM | POA: Diagnosis not present

## 2022-06-08 DIAGNOSIS — N183 Chronic kidney disease, stage 3 unspecified: Secondary | ICD-10-CM | POA: Diagnosis not present

## 2022-06-08 DIAGNOSIS — N281 Cyst of kidney, acquired: Secondary | ICD-10-CM | POA: Diagnosis not present

## 2022-06-08 DIAGNOSIS — N200 Calculus of kidney: Secondary | ICD-10-CM | POA: Diagnosis not present

## 2022-06-12 DIAGNOSIS — G4733 Obstructive sleep apnea (adult) (pediatric): Secondary | ICD-10-CM | POA: Diagnosis not present

## 2022-06-14 ENCOUNTER — Encounter: Payer: Self-pay | Admitting: Cardiology

## 2022-06-14 ENCOUNTER — Ambulatory Visit: Payer: PPO | Admitting: Cardiology

## 2022-06-14 VITALS — BP 104/74 | HR 100 | Temp 98.7°F | Resp 16 | Ht 69.0 in | Wt 244.4 lb

## 2022-06-14 DIAGNOSIS — I10 Essential (primary) hypertension: Secondary | ICD-10-CM

## 2022-06-14 DIAGNOSIS — R5383 Other fatigue: Secondary | ICD-10-CM

## 2022-06-14 DIAGNOSIS — I2510 Atherosclerotic heart disease of native coronary artery without angina pectoris: Secondary | ICD-10-CM | POA: Diagnosis not present

## 2022-06-14 DIAGNOSIS — N1832 Chronic kidney disease, stage 3b: Secondary | ICD-10-CM

## 2022-06-14 NOTE — Progress Notes (Signed)
Primary Physician/Referring:  Ginger Organ., MD  Patient ID: Dalton Mitchell, male    DOB: [birth year], 76 y.o.   MRN: [id number]  Chief Complaint  Patient presents with   Hypertension   Coronary Artery Disease    HPI:    Dalton Mitchell  is a 76 y.o. 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 Brett Fairy.  Patient was admitted to the hospital in [place], [place] on 02/24/2021 with acute cholecystitis.  Underwent ERCP and also cholecystectomy. Patient presented to the emergency room feeling poorly and confusional state, eventually led to diagnosis of abdominal wall abscess and was infected with colonic bacteria and had drainage placed 01/04/2022.  He had developed acute renal failure, and significant comorbidity.  He has finally recuperated from this.  Since the medications were discontinued, during his sepsis and recovery, his blood pressure was elevated and he was restarted on blood pressure medications at tlast visit.  He is now being followed by Dr. Harrie Jeans for nephrology.    He is here today for a follow-up. He has been extremely fatigued and unable to complete usual activities. This has been ongoing for approximately 1 year but has worsened over the last 6 months. He denies symptoms of chest pain, shortness of breath, or dizziness. He does endorse excess financial stress, difficulty sleeping and reports staying up late watching TV.  He is accompanied by his wife.  Past Medical History:  Diagnosis Date   Adenomatous colon polyp    Allergy    seasonal   Arthritis    Chronic kidney disease, stage III (GFR 30-59 ml/min)    Depression    past in early 90s   GERD 02/24/2008   Gout    History of kidney stones    Hypothyroidism    Obesity  (BMI 30-39.9)    Old inferior wall myocardial infarction    Sleep apnea    USES CPAP OCCASIONALLY   Unstable angina ([place]) 09/13/2013   Social History   Tobacco Use   Smoking status: Former    Packs/day: 1.00    Years: 4.00    Total pack years: 4.00    Types: Cigarettes    Quit date: 1970    Years since quitting: 53.7   Smokeless tobacco: Never   Tobacco comments:    Quit 1971  Substance Use Topics   Alcohol use: No   Marital Status: Married  ROS  Review of Systems  Constitutional: Positive for malaise/fatigue.  Cardiovascular:  Negative for chest pain, dyspnea on exertion and leg swelling.  Respiratory:  Positive for snoring (compliant with CPAP). Negative for shortness of breath.    Objective  Blood pressure 104/74, pulse 100, temperature 98.7 F (37.1 C), temperature source Temporal, resp. rate 16, height _0  (1.753 m), weight 244 lb 6.4 oz (110.9 kg), SpO2 94 %.     06/14/2022    1:49 PM 05/04/2022    2:30 PM 05/04/2022    2:19 PM  Vitals with BMI  Height _1   _2   Weight 244 lbs 6 oz  241 lbs 6 oz  BMI 19.37  90.24  Systolic 097 353 299  Diastolic 74 242 90  Pulse 100 69 64     Physical Exam Neck:  Vascular: No carotid bruit or JVD.  Cardiovascular:     Rate and Rhythm: Normal rate and regular rhythm.     Pulses: Intact distal pulses.          Radial pulses are 2+ on the right side and 2+ on the left side.       Femoral pulses are 2+ on the right side and 2+ on the left side.      Popliteal pulses are 1+ on the right side and 1+ on the left side.       Dorsalis pedis pulses are 2+ on the right side and 2+ on the left side.       Posterior tibial pulses are 1+ on the right side and 1+ on the left side.     Heart sounds: Normal heart sounds. No murmur heard.    No gallop.  Pulmonary:     Effort: Pulmonary effort is normal.     Breath sounds: Normal breath sounds.  Abdominal:     General: Bowel sounds are normal.     Palpations: Abdomen is soft.   Musculoskeletal:     Right lower leg: No edema.     Left lower leg: No edema.  Skin:    General: Skin is warm and dry.     Capillary Refill: Capillary refill takes less than 2 seconds.     Comments: Bilateral fingers and toes pale with discoloration at nail beds. Bilateral hands warm and dry. Bilateral feet cool to touch.    Laboratory examination:   Recent Labs    12/30/21 1415 12/30/21 2007 01/04/22 1155 05/26/22 1442  NA 135 136 133* 143  K 4.5 4.1 4.0 5.1  CL 100 105 98 104  CO2 _0 GLUCOSE 124* 118* 155* 138*  BUN 31* 30* 28* 26  CREATININE 2.77* 2.33* 2.26* 1.91*  CALCIUM 9.6 8.0* 9.4 10.6*  GFRNONAA 23* 29* 30*  --       Latest Ref Rng & Units 12/30/2021    8:07 PM 12/30/2021    2:15 PM 10/24/2021    3:33 AM  Hepatic Function  Total Protein 6.5 - 8.1 g/dL 5.6  7.4  7.4   Albumin 3.5 - 5.0 g/dL 2.3  3.1  3.5   AST 15 - 41 U/L 28  33  147   ALT 0 - 44 U/L 23  28  279   Alk Phosphatase 38 - 126 U/L 43  52  145   Total Bilirubin 0.3 - 1.2 mg/dL 0.6  0.8  0.9        Latest Ref Rng & Units 01/04/2022   11:55 AM 12/30/2021    8:07 PM 12/30/2021    1:38 PM  CBC  WBC 4.0 - 10.5 K/uL 11.0  8.3    Hemoglobin 13.0 - 17.0 g/dL 15.0  12.8  16.7   Hematocrit 39.0 - 52.0 % 45.6  40.1  49.0   Platelets 150 - 400 K/uL 269  188      External labs:  Labs 04/15/2022:  A1c 6.0%.  TSH 3.26, normal.  Free T4 normal.  Total cholesterol 147, triglycerides 156, HDL 55, LDL 61.  Non-HDL cholesterol 92.  Serum glucose 1 4 mg, BUN 24, serum creatinine 1.7, EGFR 39 mL, potassium 4.5, LFTs normal.  Uric acid 6.2, normal.  Hb 17.3/HCT 50.7, platelets 206, normal indicis.  Labs 01/26/2022:  Hb 15.1/HCT 45.4, platelets 190.  Normal indicis.  BUN 13, creatinine 1.9, EGFR 34 mL.  Potassium 3.8.  LFTs normal.  Medications and allergies   Allergies  Allergen Reactions   Bupropion     Other reaction(s): hallucinations   Morphine And Related Other (See Comments)     STATES HORRIBLE HALLUCINATIONS.   Ace Inhibitors Cough   Lisinopril Other (See Comments) and Cough     Current Outpatient Medications:    allopurinol (ZYLOPRIM) 300 MG tablet, Take 300 mg by mouth at bedtime., Disp: , Rfl:    ALPRAZolam (XANAX) 0.5 MG tablet, Take 1 tablet by mouth as needed., Disp: , Rfl:    aspirin EC 325 MG tablet, Take 325 mg by mouth at bedtime., Disp: , Rfl:    cetirizine (ZYRTEC) 10 MG tablet, Take 10 mg by mouth daily., Disp: , Rfl:    Coenzyme Q10 (COQ-10 PO), Take 100 mg by mouth daily., Disp: , Rfl:    Dextromethorphan-guaiFENesin 10-200 MG/15ML LIQD, Take by mouth., Disp: , Rfl:    escitalopram (LEXAPRO) 10 MG tablet, Take by mouth., Disp: , Rfl:    ezetimibe (ZETIA) 10 MG tablet, Take 10 mg by mouth daily., Disp: , Rfl:    famotidine (PEPCID) 20 MG tablet, Take 20 mg by mouth 2 (two) times daily., Disp: , Rfl:    FLUTICASONE PROPIONATE, NASAL, NA, Place 2 Inhalers into the nose daily., Disp: , Rfl:    Levothyroxine Sodium 100 MCG CAPS, Take 100 mcg by mouth daily before breakfast., Disp: , Rfl:    losartan (COZAAR) 25 MG tablet, Take 1 tablet (25 mg total) by mouth every evening., Disp: 30 tablet, Rfl: 2   oxyCODONE-acetaminophen (PERCOCET/ROXICET) 5-325 MG tablet, Take 1 tablet by mouth as needed., Disp: , Rfl:    Probiotic Product (PROBIOTIC-10 PO), Take by mouth., Disp: , Rfl:    rosuvastatin (CRESTOR) 20 MG tablet, Take 20 mg by mouth daily., Disp: , Rfl:    tamsulosin (FLOMAX) 0.4 MG CAPS capsule, Take 0.4 mg by mouth daily after supper., Disp: , Rfl:    zolpidem (AMBIEN) 10 MG tablet, Take 1 tablet (10 mg total) by mouth at bedtime., Disp: 30 tablet, Rfl: 1   metoprolol tartrate (LOPRESSOR) 25 MG tablet, Take 1 tablet (25 mg total) by mouth 2 (two) times daily., Disp: 180 tablet, Rfl: 3    Radiology:    Cardiac Studies:   Coronary angiogram 09/14/2013: Proximal LAD 80%, OM1 95% stenosis, proximal RCA subtotal stenosis.  CABG x 4 09/17/2013:  Utilizing LIMA to LAD, SVG to OM2, and a sequential SVG to PDA and distal RCA.  Lexiscan (Walking with mod Bruce)Tetrofosmin Stress Test  02/06/2020: Nondiagnostic ECG stress. Mild degree medium extent perfusion defect consistent with mild (reversible) ischemia located in the mid inferolateral wall and basal inferolateral wall  (Left Circumflex Artery region) of left ventricle. Overall LV systolic function is normal without regional wall motion abnormalities. Stress LV EF: 63%.  No previous exam available for comparison. Low risk.   Echocardiogram 03/31/2021: Technically difficult study with suboptimal images. Left ventricle cavity is normal in size and wall thickness. Normal global wall motion. Normal LV systolic function with EF 61%. Doppler evidence of grade I (impaired) diastolic dysfunction, normal LAP.  No significant valvular abnormality. IVC not seen.  EKG  EKG 05/23/2022: Normal sinus rhythm with rate of 66 bpm, normal axis, incomplete right bundle branch block.  Poor R progression, cannot exclude anteroseptal infarct old.  No evidence of ischemia.  Normal QT interval.  EKG 03/20/2021: Sinus rhythm at first-degree block at rate of 74 bpm, left axis deviation,  left anterior fascicular block.  No evidence of ischemia, normal QT interval.  No significant change from 09/18/2020.   Assessment     ICD-10-CM   1. Primary hypertension  I10     2. Coronary artery disease involving native coronary artery of native heart without angina pectoris  I25.10 metoprolol tartrate (LOPRESSOR) 25 MG tablet    3. Stage 3b chronic kidney disease (HCC)  N18.32       No orders of the defined types were placed in this encounter.   Medications Discontinued During This Encounter  Medication Reason   OVER THE COUNTER MEDICATION    metoprolol tartrate (LOPRESSOR) 25 MG tablet Reorder     Recommendations:   Dalton Mitchell  is a 76 y.o. Caucasian male with coronary artery disease S/P CABG 2014,  obesity, hypertension, hyperlipidemia, stage IV chronic kidney disease, history of inferior MI in 1995 SP balloon angioplasty to RCA, history of CABG x4 in 2014 or 2015, mild sleep apnea compliant with CPAP, follows Dr. Brett Fairy.  Patient was admitted to the hospital in [place], [place] on 02/24/2021 with acute cholecystitis.  Underwent ERCP and also cholecystectomy. Patient presented to the emergency room feeling poorly and confusional state, eventually led to diagnosis of abdominal wall abscess and was infected with colonic bacteria and had drainage placed 01/04/2022.  He had developed acute renal failure, and significant comorbidity.  He has finally recuperated from this.   During hospitalization his medications were changed and he was not on lisinopril and also metoprolol dose was held. At last visit Metoprolol was restarted at 34m daily and losartan 254mwas added for blood pressure control and cardiovascular protection. Labs reviewed.  From cardiac standpoint he has had no heart failure, no recurrence of angina pectoris. We discussed stress reduction and better sleeping habits. Including decreasing time watching TV and napping. We also discussed OTC sleep aides such as melatonin. We have increased metoprolol to 2575mwice daily. At this time I do not feel that he needs further cardiac working for fatigue. If symptoms do not improve with medication change and lifestyle change, would consider further cardiac testing.  Follow-up in 6 weeks.   BriErnst SpellGNP-C 06/14/2022, 3:20 PM Office: [phone]: [phone].

## 2022-06-14 NOTE — Patient Instructions (Signed)
Hold Losartan

## 2022-06-24 DIAGNOSIS — G4733 Obstructive sleep apnea (adult) (pediatric): Secondary | ICD-10-CM | POA: Diagnosis not present

## 2022-06-28 DIAGNOSIS — M170 Bilateral primary osteoarthritis of knee: Secondary | ICD-10-CM | POA: Diagnosis not present

## 2022-06-29 DIAGNOSIS — I509 Heart failure, unspecified: Secondary | ICD-10-CM | POA: Diagnosis not present

## 2022-07-07 NOTE — Progress Notes (Deleted)
Office Visit Note  Patient: Dalton Mitchell             Date of Birth: [birth year]           MRN: [id number]             PCP: Ginger Organ., MD Referring: Claudia Desanctis, MD Visit Date: 07/21/2022 Occupation: '@GUAROCC'$ @  Subjective:  No chief complaint on file.   History of Present Illness: Dalton Mitchell is a 76 y.o. male ***   Activities of Daily Living:  Patient reports morning stiffness for *** {minute/hour:19697}.   Patient {ACTIONS;DENIES/REPORTS:21021675::"Denies"} nocturnal pain.  Difficulty dressing/grooming: {ACTIONS;DENIES/REPORTS:21021675::"Denies"} Difficulty climbing stairs: {ACTIONS;DENIES/REPORTS:21021675::"Denies"} Difficulty getting out of chair: {ACTIONS;DENIES/REPORTS:21021675::"Denies"} Difficulty using hands for taps, buttons, cutlery, and/or writing: {ACTIONS;DENIES/REPORTS:21021675::"Denies"}  No Rheumatology ROS completed.   PMFS History:  Patient Active Problem List   Diagnosis Date Noted   Primary hypertension 04/09/2022   Excessive daytime sleepiness 03/28/2020   OSA on CPAP 03/28/2020   Mass of finger of right hand 12/02/2017   Pain in finger of right hand 12/02/2017   Osteoarthritis of finger of right hand 12/02/2017   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   Obesity (BMI 30-39.9)    Old inferior wall myocardial infarction    Sleep apnea    GERD 02/24/2008   Hypothyroidism    Hypercholesteremia    Gout    Coronary artery disease involving native coronary artery of native heart without angina pectoris    Chronic kidney disease (CKD), stage IV (severe) (HCC)    History of kidney stones     Past Medical History:  Diagnosis Date   Adenomatous colon polyp    Allergy    seasonal   Arthritis    Chronic kidney disease, stage III (GFR 30-59 ml/min)    Depression    past in early 90s   GERD 02/24/2008   Gout    History of kidney stones    Hypothyroidism    Obesity (BMI 30-39.9)    Old inferior wall myocardial infarction    Sleep apnea     USES CPAP OCCASIONALLY   Unstable angina ([place]) 09/13/2013    Family History  Problem Relation Age of Onset   Colon cancer Maternal Grandmother    Colon polyps Maternal Grandmother    Emphysema Mother    Esophageal cancer Neg Hx    Rectal cancer Neg Hx    Stomach cancer Neg Hx    Past Surgical History:  Procedure Laterality Date   CHOLECYSTECTOMY     COLONOSCOPY  12/25/2014   [place] previously   [place]   CORONARY ARTERY BYPASS GRAFT N/A 09/17/2013   Procedure: CORONARY ARTERY BYPASS GRAFTING (CABG);  Surgeon: Melrose Nakayama, MD;  Location: [facility];  Service: Open Heart Surgery;  Laterality: N/A;   CYSTOSCOPY WITH RETROGRADE PYELOGRAM, URETEROSCOPY AND STENT PLACEMENT Right 01/11/2014   Procedure: CYSTOSCOPY WITH RETROGRADE PYELOGRAM, URETEROSCOPY AND STENT PLACEMENT;  Surgeon: Molli Hazard, MD;  Location: [facility];  Service: Urology;  Laterality: Right;   CYSTOSCOPY WITH RETROGRADE PYELOGRAM, URETEROSCOPY AND STENT PLACEMENT Right 02/01/2014   Procedure: CYSTOSCOPY WITH RETROGRADE PYELOGRAM, URETEROSCOPY AND STENT EXCHANGE;  Surgeon: Molli Hazard, MD;  Location: [facility];  Service: Urology;  Laterality: Right;   EXPLORATION POST OPERATIVE OPEN HEART N/A 09/17/2013   Procedure: EXPLORATION POST OPERATIVE OPEN HEART;  Surgeon: Melrose Nakayama, MD;  Location: [facility];  Service: Open Heart  Surgery;  Laterality: N/A;   HOLMIUM LASER APPLICATION Right 81/19/1478   Procedure: HOLMIUM LASER APPLICATION;  Surgeon: Molli Hazard, MD;  Location: [facility];  Service: Urology;  Laterality: Right;   IR RADIOLOGIST EVAL & MGMT  01/12/2022   LEFT HEART CATHETERIZATION WITH CORONARY ANGIOGRAM N/A 09/14/2013   Procedure: LEFT HEART CATHETERIZATION WITH CORONARY ANGIOGRAM;  Surgeon: Jacolyn Reedy, MD;  Location: [facility] CATH LAB;  Service: Cardiovascular;  Laterality: N/A;   POLYPECTOMY  2022   Social History    Social History Narrative   Drinks about 1-2 caffeine beverages a day    Immunization History  Administered Date(s) Administered   PFIZER(Purple Top)SARS-COV-2 Vaccination 11/24/2019, 12/15/2019   Tdap 12/20/2015     Objective: Vital Signs: There were no vitals taken for this visit.   Physical Exam   Musculoskeletal Exam: ***  CDAI Exam: CDAI Score: -- Patient Global: --; Provider Global: -- Swollen: --; Tender: -- Joint Exam 07/21/2022   No joint exam has been documented for this visit   There is currently no information documented on the homunculus. Go to the Rheumatology activity and complete the homunculus joint exam.  Investigation: No additional findings.  Imaging: No results found.  Recent Labs: Lab Results  Component Value Date   WBC 11.0 (H) 01/04/2022   HGB 15.0 01/04/2022   PLT 269 01/04/2022   NA 143 05/26/2022   K 5.1 05/26/2022   CL 104 05/26/2022   CO2 25 05/26/2022   GLUCOSE 138 (H) 05/26/2022   BUN 26 05/26/2022   CREATININE 1.91 (H) 05/26/2022   BILITOT 0.6 12/30/2021   ALKPHOS 43 12/30/2021   AST 28 12/30/2021   ALT 23 12/30/2021   PROT 5.6 (L) 12/30/2021   ALBUMIN 2.3 (L) 12/30/2021   CALCIUM 10.6 (H) 05/26/2022   GFRAA 29 (L) 01/25/2014    Speciality Comments: No specialty comments available.  Procedures:  No procedures performed Allergies: Bupropion, Morphine and related, Ace inhibitors, and Lisinopril   Assessment / Plan:     Visit Diagnoses: Positive ANA (antinuclear antibody) - 05/14/22:ANA 1:160 speckled, C3 155, C4 32, ANCA-  History of gout  History of kidney stones  Chronic kidney disease (CKD), stage IV (severe) (HCC)  Primary hypertension  Old inferior wall myocardial infarction  S/P CABG x 4  Coronary artery disease involving native coronary artery of native heart without angina pectoris  OSA on CPAP  History of gastroesophageal reflux (GERD)  History of  hypothyroidism  Hypercholesteremia  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  No orders of the defined types were placed in this encounter.   Face-to-face time spent with patient was *** minutes. Greater than 50% of time was spent in counseling and coordination of care.  Follow-Up Instructions: No follow-ups on file.   Ofilia Neas, PA-C  Note - This record has been created using Dragon software.  Chart creation errors have been sought, but may not always  have been located. Such creation errors do not reflect on  the standard of medical care.

## 2022-07-12 DIAGNOSIS — M170 Bilateral primary osteoarthritis of knee: Secondary | ICD-10-CM | POA: Diagnosis not present

## 2022-07-12 DIAGNOSIS — G4733 Obstructive sleep apnea (adult) (pediatric): Secondary | ICD-10-CM | POA: Diagnosis not present

## 2022-07-19 DIAGNOSIS — M170 Bilateral primary osteoarthritis of knee: Secondary | ICD-10-CM | POA: Diagnosis not present

## 2022-07-21 ENCOUNTER — Ambulatory Visit: Payer: PPO | Admitting: Rheumatology

## 2022-07-21 DIAGNOSIS — Z8719 Personal history of other diseases of the digestive system: Secondary | ICD-10-CM

## 2022-07-21 DIAGNOSIS — Z951 Presence of aortocoronary bypass graft: Secondary | ICD-10-CM

## 2022-07-21 DIAGNOSIS — G4733 Obstructive sleep apnea (adult) (pediatric): Secondary | ICD-10-CM

## 2022-07-21 DIAGNOSIS — N2581 Secondary hyperparathyroidism of renal origin: Secondary | ICD-10-CM

## 2022-07-21 DIAGNOSIS — N184 Chronic kidney disease, stage 4 (severe): Secondary | ICD-10-CM

## 2022-07-21 DIAGNOSIS — Z87442 Personal history of urinary calculi: Secondary | ICD-10-CM

## 2022-07-21 DIAGNOSIS — Z8639 Personal history of other endocrine, nutritional and metabolic disease: Secondary | ICD-10-CM

## 2022-07-21 DIAGNOSIS — I10 Essential (primary) hypertension: Secondary | ICD-10-CM

## 2022-07-21 DIAGNOSIS — R768 Other specified abnormal immunological findings in serum: Secondary | ICD-10-CM

## 2022-07-21 DIAGNOSIS — I252 Old myocardial infarction: Secondary | ICD-10-CM

## 2022-07-21 DIAGNOSIS — I2510 Atherosclerotic heart disease of native coronary artery without angina pectoris: Secondary | ICD-10-CM

## 2022-07-21 DIAGNOSIS — Z8739 Personal history of other diseases of the musculoskeletal system and connective tissue: Secondary | ICD-10-CM

## 2022-07-21 DIAGNOSIS — E7800 Pure hypercholesterolemia, unspecified: Secondary | ICD-10-CM

## 2022-07-26 ENCOUNTER — Ambulatory Visit: Payer: PPO | Admitting: Cardiology

## 2022-07-26 DIAGNOSIS — M170 Bilateral primary osteoarthritis of knee: Secondary | ICD-10-CM | POA: Diagnosis not present

## 2022-08-02 DIAGNOSIS — M170 Bilateral primary osteoarthritis of knee: Secondary | ICD-10-CM | POA: Diagnosis not present

## 2022-08-10 ENCOUNTER — Ambulatory Visit: Payer: PPO | Admitting: Cardiology

## 2022-08-10 ENCOUNTER — Encounter: Payer: Self-pay | Admitting: Cardiology

## 2022-08-10 VITALS — BP 129/85 | HR 86 | Ht 69.0 in | Wt 245.0 lb

## 2022-08-10 DIAGNOSIS — I2510 Atherosclerotic heart disease of native coronary artery without angina pectoris: Secondary | ICD-10-CM | POA: Diagnosis not present

## 2022-08-10 DIAGNOSIS — I129 Hypertensive chronic kidney disease with stage 1 through stage 4 chronic kidney disease, or unspecified chronic kidney disease: Secondary | ICD-10-CM | POA: Diagnosis not present

## 2022-08-10 DIAGNOSIS — I10 Essential (primary) hypertension: Secondary | ICD-10-CM

## 2022-08-10 DIAGNOSIS — N1832 Chronic kidney disease, stage 3b: Secondary | ICD-10-CM

## 2022-08-10 MED ORDER — LOSARTAN POTASSIUM 25 MG PO TABS: 25.0000 mg | ORAL_TABLET | Freq: Every evening | ORAL | 3 refills | Status: DC

## 2022-08-10 NOTE — Progress Notes (Signed)
Primary Physician/Referring:  Ginger Organ., MD  Patient ID: Dalton Mitchell, male    DOB: 01-Apr-1946, 76 y.o.   MRN: [id number]  Chief Complaint  Patient presents with   Follow-up    6 weeks   Hypertension   Hyperlipidemia    HPI:    Dalton Mitchell  is a 76 y.o. Caucasian male with coronary artery disease S/P CABG 2014, obesity, hypertension, hyperlipidemia, stage IV chronic kidney disease, history of inferior MI in 1995 SP balloon angioplasty to RCA, history of CABG x4 in 2014 or 2015, mild sleep apnea has not been compliant with CPAP, follows Dr. Brett Fairy.  He presents here for 6-week follow-up of hypertension, coronary artery disease and medication management.  Since cholecystectomy in 2022 followed by late complication and abdominal wall abscess and septic shock on 01/04/2022, multiple medications have been changed. He has been extremely fatigued and unable to complete usual activities. This has been ongoing for approximately 1 year. He is accompanied by his wife.  Past Medical History:  Diagnosis Date   Adenomatous colon polyp    Allergy    seasonal   Arthritis    Chronic kidney disease, stage III (GFR 30-59 ml/min)    Depression    past in early 90s   GERD 02/24/2008   Gout    History of kidney stones    Hypothyroidism    Obesity (BMI 30-39.9)    Old inferior wall myocardial infarction    Sleep apnea    USES CPAP OCCASIONALLY   Unstable angina ([place]) 09/13/2013   Social History   Tobacco Use   Smoking status: Former    Packs/day: 1.00    Years: 4.00    Total pack years: 4.00    Types: Cigarettes    Quit date: 1970    Years since quitting: 53.8   Smokeless tobacco: Never   Tobacco comments:    Quit 1971  Substance Use Topics   Alcohol use: No   Marital Status: Married  ROS  Review of Systems  Constitutional: Positive for malaise/fatigue.  Cardiovascular:  Negative for chest pain, dyspnea on exertion and leg swelling.  Respiratory:   Positive for snoring (compliant with CPAP). Negative for shortness of breath.    Objective  Blood pressure 129/85, pulse 86, height _0  (1.753 m), weight 245 lb (111.1 kg), SpO2 96 %.     08/10/2022    2:43 PM 06/14/2022    1:49 PM 05/04/2022    2:30 PM  Vitals with BMI  Height _1  _2    Weight 245 lbs 244 lbs 6 oz   BMI 85.46 27.03   Systolic 500 938 182  Diastolic 85 74 993  Pulse 86 100 69     Physical Exam Neck:     Vascular: No carotid bruit or JVD.  Cardiovascular:     Rate and Rhythm: Normal rate and regular rhythm.     Pulses: Intact distal pulses.          Radial pulses are 2+ on the right side and 2+ on the left side.       Femoral pulses are 2+ on the right side and 2+ on the left side.      Popliteal pulses are 1+ on the right side and 1+ on the left side.       Dorsalis pedis pulses are 2+ on the right side and 2+ on the left side.       Posterior tibial pulses are  1+ on the right side and 1+ on the left side.     Heart sounds: Normal heart sounds. No murmur heard.    No gallop.  Pulmonary:     Effort: Pulmonary effort is normal.     Breath sounds: Normal breath sounds.  Abdominal:     General: Bowel sounds are normal.     Palpations: Abdomen is soft.  Musculoskeletal:     Right lower leg: No edema.     Left lower leg: No edema.  Skin:    General: Skin is warm and dry.     Capillary Refill: Capillary refill takes less than 2 seconds.     Comments: Bilateral fingers and toes pale with discoloration at nail beds. Bilateral hands warm and dry. Bilateral feet cool to touch.   Laboratory examination:   Recent Labs    12/30/21 1415 12/30/21 2007 01/04/22 1155 05/26/22 1442  NA 135 136 133* 143  K 4.5 4.1 4.0 5.1  CL 100 105 98 104  CO2 _0 GLUCOSE 124* 118* 155* 138*  BUN 31* 30* 28* 26  CREATININE 2.77* 2.33* 2.26* 1.91*  CALCIUM 9.6 8.0* 9.4 10.6*  GFRNONAA 23* 29* 30*  --       Latest Ref Rng & Units 12/30/2021    8:07 PM 12/30/2021     2:15 PM 10/24/2021    3:33 AM  Hepatic Function  Total Protein 6.5 - 8.1 g/dL 5.6  7.4  7.4   Albumin 3.5 - 5.0 g/dL 2.3  3.1  3.5   AST 15 - 41 U/L 28  33  147   ALT 0 - 44 U/L 23  28  279   Alk Phosphatase 38 - 126 U/L 43  52  145   Total Bilirubin 0.3 - 1.2 mg/dL 0.6  0.8  0.9        Latest Ref Rng & Units 01/04/2022   11:55 AM 12/30/2021    8:07 PM 12/30/2021    1:38 PM  CBC  WBC 4.0 - 10.5 K/uL 11.0  8.3    Hemoglobin 13.0 - 17.0 g/dL 15.0  12.8  16.7   Hematocrit 39.0 - 52.0 % 45.6  40.1  49.0   Platelets 150 - 400 K/uL 269  188      External labs:  Labs 04/15/2022:  A1c 6.0%.  TSH 3.26, normal.  Free T4 normal.  Total cholesterol 147, triglycerides 156, HDL 55, LDL 61.  Non-HDL cholesterol 92.  Serum glucose 1 4 mg, BUN 24, serum creatinine 1.7, EGFR 39 mL, potassium 4.5, LFTs normal.  Uric acid 6.2, normal.  Hb 17.3/HCT 50.7, platelets 206, normal indicis.  Labs 01/26/2022:  Hb 15.1/HCT 45.4, platelets 190.  Normal indicis.  BUN 13, creatinine 1.9, EGFR 34 mL.  Potassium 3.8.  LFTs normal.  Medications and allergies   Allergies  Allergen Reactions   Bupropion     Other reaction(s): hallucinations   Morphine And Related Other (See Comments)    STATES HORRIBLE HALLUCINATIONS.   Ace Inhibitors Cough   Lisinopril Other (See Comments) and Cough     Current Outpatient Medications:    Acetaminophen (TYLENOL EXTRA STRENGTH PO), Take by mouth., Disp: , Rfl:    allopurinol (ZYLOPRIM) 300 MG tablet, Take 300 mg by mouth at bedtime., Disp: , Rfl:    aspirin EC 325 MG tablet, Take 325 mg by mouth at bedtime., Disp: , Rfl:    cetirizine (ZYRTEC) 10 MG tablet, Take 10 mg by  mouth daily., Disp: , Rfl:    Cholecalciferol (VITAMIN D-3) 25 MCG (1000 UT) CAPS, Take 1 capsule by mouth daily at 2 PM., Disp: , Rfl:    Coenzyme Q10 (COQ-10 PO), Take 100 mg by mouth daily., Disp: , Rfl:    escitalopram (LEXAPRO) 10 MG tablet, Take by mouth., Disp: , Rfl:    ezetimibe (ZETIA)  10 MG tablet, Take 10 mg by mouth daily., Disp: , Rfl:    famotidine (PEPCID) 20 MG tablet, Take 20 mg by mouth 2 (two) times daily., Disp: , Rfl:    FLUTICASONE PROPIONATE, NASAL, NA, Place 2 Inhalers into the nose daily., Disp: , Rfl:    Levothyroxine Sodium 100 MCG CAPS, Take 100 mcg by mouth daily before breakfast., Disp: , Rfl:    loratadine (CLARITIN) 10 MG tablet, Take 10 mg by mouth daily., Disp: , Rfl:    metoprolol tartrate (LOPRESSOR) 25 MG tablet, Take 1 tablet (25 mg total) by mouth 2 (two) times daily., Disp: 180 tablet, Rfl: 3   oxyCODONE-acetaminophen (PERCOCET/ROXICET) 5-325 MG tablet, Take 1 tablet by mouth as needed., Disp: , Rfl:    Probiotic Product (PROBIOTIC-10 PO), Take by mouth., Disp: , Rfl:    rosuvastatin (CRESTOR) 20 MG tablet, Take 20 mg by mouth daily., Disp: , Rfl:    tamsulosin (FLOMAX) 0.4 MG CAPS capsule, Take 0.4 mg by mouth daily after supper., Disp: , Rfl:    traZODone (DESYREL) 50 MG tablet, Take 50 mg by mouth at bedtime., Disp: , Rfl:    Ubiquinol 100 MG CAPS, Take 1 capsule by mouth daily at 2 PM., Disp: , Rfl:    ALPRAZolam (XANAX) 0.5 MG tablet, Take 1 tablet by mouth as needed., Disp: , Rfl:    Dextromethorphan-guaiFENesin 10-200 MG/15ML LIQD, Take by mouth. (Patient not taking: Reported on 08/10/2022), Disp: , Rfl:    losartan (COZAAR) 25 MG tablet, Take 1 tablet (25 mg total) by mouth every evening., Disp: 90 tablet, Rfl: 3    Radiology:    Cardiac Studies:   Coronary angiogram 09/14/2013: Proximal LAD 80%, OM1 95% stenosis, proximal RCA subtotal stenosis.  CABG x 4 09/17/2013: Utilizing LIMA to LAD, SVG to OM2, and a sequential SVG to PDA and distal RCA.  Lexiscan (Walking with mod Bruce)Tetrofosmin Stress Test  02/06/2020: Nondiagnostic ECG stress. Mild degree medium extent perfusion defect consistent with mild (reversible) ischemia located in the mid inferolateral wall and basal inferolateral wall  (Left Circumflex Artery region) of left  ventricle. Overall LV systolic function is normal without regional wall motion abnormalities. Stress LV EF: 63%.  No previous exam available for comparison. Low risk.   Echocardiogram 03/31/2021: Technically difficult study with suboptimal images. Left ventricle cavity is normal in size and wall thickness. Normal global wall motion. Normal LV systolic function with EF 61%. Doppler evidence of grade I (impaired) diastolic dysfunction, normal LAP.  No significant valvular abnormality. IVC not seen.  EKG  EKG 08/13/2022: Sinus rhythm with first-degree AV block at rate of 84 bpm, left atrial enlargement.  Incomplete right bundle branch block.  Poor R wave progression, cannot exclude anteroseptal infarct old.  Not exclude inferior infarct old.  Normal QT interval.  Compared to 05/04/2022, no significant change.   Assessment     ICD-10-CM   1. Coronary artery disease involving native coronary artery of native heart without angina pectoris  I25.10 EKG 12-Lead    2. Primary hypertension  I10 losartan (COZAAR) 25 MG tablet    3. Stage 3b chronic  kidney disease ([place])  N18.32 losartan (COZAAR) 25 MG tablet      Meds ordered this encounter  Medications   losartan (COZAAR) 25 MG tablet    Sig: Take 1 tablet (25 mg total) by mouth every evening.    Dispense:  90 tablet    Refill:  3    Medications Discontinued During This Encounter  Medication Reason   zolpidem (AMBIEN) 10 MG tablet Patient Preference   losartan (COZAAR) 25 MG tablet Reorder    Recommendations:   Dalton Mitchell  is a 75 y.o. Caucasian male with coronary artery disease S/P CABG 2014, obesity, hypertension, hyperlipidemia, stage IV chronic kidney disease, history of inferior MI in 1995 SP balloon angioplasty to RCA, history of CABG x4 in 2014 or 2015, mild sleep apnea has not been compliant with CPAP, follows Dr. Brett Fairy.  He presents here for 6-week follow-up of hypertension, coronary artery disease and medication  management.  Since cholecystectomy in 2022 followed by late complication and abdominal wall abscess and septic shock on 01/04/2022, multiple medications have been changed.  1. Coronary artery disease involving native coronary artery of native heart without angina pectoris From cardiac standpoint he remains angina free.  He has had a low risk stress test in 2021 and had extensive medical issues in spite of this has not had any cardiac events.  Continue present medications.  2. Primary hypertension Blood pressure is now well controlled.  Serum creatinine has remained stable.  He is presently tolerating low-dose of losartan.  3. Stage 3b chronic kidney disease ([place]) Labs reviewed, stage IIIb chronic kidney disease has remained stable.  No changes in the medications were done today.  Overall from cardiovascular standpoint, lipids are well controlled, blood pressure is well controlled, he remains angina free and heart failure free.  His symptoms of marked fatigue, chronic dyspnea has been ongoing for the past >1-year is related to obesity hypoventilation, deconditioning, poor lifestyle.  I will see him back in 6 months for follow-up.  I spent 25 minutes with the patient and discussions regarding lifestyle modification.  His wife is present and all questions answered.    Adrian Prows, AGNP-C 08/10/2022, 3:23 PM Office: [phone] Pager: [phone].

## 2022-08-11 ENCOUNTER — Ambulatory Visit: Payer: PPO | Admitting: Rheumatology

## 2022-08-11 DIAGNOSIS — M170 Bilateral primary osteoarthritis of knee: Secondary | ICD-10-CM | POA: Diagnosis not present

## 2022-08-12 DIAGNOSIS — G4733 Obstructive sleep apnea (adult) (pediatric): Secondary | ICD-10-CM | POA: Diagnosis not present

## 2022-08-27 DIAGNOSIS — N1832 Chronic kidney disease, stage 3b: Secondary | ICD-10-CM | POA: Diagnosis not present

## 2022-09-11 DIAGNOSIS — G4733 Obstructive sleep apnea (adult) (pediatric): Secondary | ICD-10-CM | POA: Diagnosis not present

## 2022-09-15 DIAGNOSIS — R768 Other specified abnormal immunological findings in serum: Secondary | ICD-10-CM | POA: Diagnosis not present

## 2022-09-15 DIAGNOSIS — N400 Enlarged prostate without lower urinary tract symptoms: Secondary | ICD-10-CM | POA: Diagnosis not present

## 2022-09-15 DIAGNOSIS — N2581 Secondary hyperparathyroidism of renal origin: Secondary | ICD-10-CM | POA: Diagnosis not present

## 2022-09-15 DIAGNOSIS — R809 Proteinuria, unspecified: Secondary | ICD-10-CM | POA: Diagnosis not present

## 2022-09-15 DIAGNOSIS — I129 Hypertensive chronic kidney disease with stage 1 through stage 4 chronic kidney disease, or unspecified chronic kidney disease: Secondary | ICD-10-CM | POA: Diagnosis not present

## 2022-09-15 DIAGNOSIS — N200 Calculus of kidney: Secondary | ICD-10-CM | POA: Diagnosis not present

## 2022-09-15 DIAGNOSIS — N2889 Other specified disorders of kidney and ureter: Secondary | ICD-10-CM | POA: Diagnosis not present

## 2022-09-15 DIAGNOSIS — N1832 Chronic kidney disease, stage 3b: Secondary | ICD-10-CM | POA: Diagnosis not present

## 2022-09-22 DIAGNOSIS — G4733 Obstructive sleep apnea (adult) (pediatric): Secondary | ICD-10-CM | POA: Diagnosis not present

## 2022-10-12 DIAGNOSIS — G4733 Obstructive sleep apnea (adult) (pediatric): Secondary | ICD-10-CM | POA: Diagnosis not present

## 2022-10-26 ENCOUNTER — Ambulatory Visit: Payer: PPO | Admitting: Neurology

## 2022-10-28 DIAGNOSIS — F339 Major depressive disorder, recurrent, unspecified: Secondary | ICD-10-CM | POA: Diagnosis not present

## 2022-10-28 DIAGNOSIS — R7301 Impaired fasting glucose: Secondary | ICD-10-CM | POA: Diagnosis not present

## 2022-10-28 DIAGNOSIS — I700 Atherosclerosis of aorta: Secondary | ICD-10-CM | POA: Diagnosis not present

## 2022-10-28 DIAGNOSIS — R809 Proteinuria, unspecified: Secondary | ICD-10-CM | POA: Diagnosis not present

## 2022-10-28 DIAGNOSIS — Z23 Encounter for immunization: Secondary | ICD-10-CM | POA: Diagnosis not present

## 2022-10-28 DIAGNOSIS — G4700 Insomnia, unspecified: Secondary | ICD-10-CM | POA: Diagnosis not present

## 2022-10-28 DIAGNOSIS — F1320 Sedative, hypnotic or anxiolytic dependence, uncomplicated: Secondary | ICD-10-CM | POA: Diagnosis not present

## 2022-10-28 DIAGNOSIS — F17201 Nicotine dependence, unspecified, in remission: Secondary | ICD-10-CM | POA: Diagnosis not present

## 2022-10-28 DIAGNOSIS — I129 Hypertensive chronic kidney disease with stage 1 through stage 4 chronic kidney disease, or unspecified chronic kidney disease: Secondary | ICD-10-CM | POA: Diagnosis not present

## 2022-10-28 DIAGNOSIS — I25810 Atherosclerosis of coronary artery bypass graft(s) without angina pectoris: Secondary | ICD-10-CM | POA: Diagnosis not present

## 2022-10-28 DIAGNOSIS — G4733 Obstructive sleep apnea (adult) (pediatric): Secondary | ICD-10-CM | POA: Diagnosis not present

## 2022-10-28 DIAGNOSIS — E785 Hyperlipidemia, unspecified: Secondary | ICD-10-CM | POA: Diagnosis not present

## 2022-10-28 DIAGNOSIS — N184 Chronic kidney disease, stage 4 (severe): Secondary | ICD-10-CM | POA: Diagnosis not present

## 2022-10-31 ENCOUNTER — Encounter: Payer: Self-pay | Admitting: Cardiology

## 2022-11-09 ENCOUNTER — Ambulatory Visit (INDEPENDENT_AMBULATORY_CARE_PROVIDER_SITE_OTHER): Payer: PPO | Admitting: Neurology

## 2022-11-09 ENCOUNTER — Encounter: Payer: Self-pay | Admitting: Neurology

## 2022-11-09 VITALS — BP 114/75 | HR 71 | Ht 68.0 in | Wt 252.4 lb

## 2022-11-09 DIAGNOSIS — G3184 Mild cognitive impairment, so stated: Secondary | ICD-10-CM | POA: Diagnosis not present

## 2022-11-09 NOTE — Patient Instructions (Signed)
Management of Memory Problems  There are some general things you can do to help manage your memory problems.  Your memory may not in fact recover, but by using techniques and strategies you will be able to manage your memory difficulties better.  1)  Establish a routine. Try to establish and then stick to a regular routine.  By doing this, you will get used to what to expect and you will reduce the need to rely on your memory.  Also, try to do things at the same time of day, such as taking your medication or checking your calendar first thing in the morning. Think about think that you can do as a part of a regular routine and make a list.  Then enter them into a daily planner to remind you.  This will help you establish a routine.  2)  Organize your environment. Organize your environment so that it is uncluttered.  Decrease visual stimulation.  Place everyday items such as keys or cell phone in the same place every day (ie.  Basket next to front door) Use post it notes with a brief message to yourself (ie. Turn off light, lock the door) Use labels to indicate where things go (ie. Which cupboards are for food, dishes, etc.) Keep a notepad and pen by the telephone to take messages  3)  Memory Aids A diary or journal/notebook/daily planner Making a list (shopping list, chore list, to do list that needs to be done) Using an alarm as a reminder (kitchen timer or cell phone alarm) Using cell phone to store information (Notes, Calendar, Reminders) Calendar/White board placed in a prominent position Post-it notes  In order for memory aids to be useful, you need to have good habits.  It's no good remembering to make a note in your journal if you don't remember to look in it.  Try setting aside a certain time of day to look in journal.  4)  Improving mood and managing fatigue. There may be other factors that contribute to memory difficulties.  Factors, such as anxiety, depression and tiredness can  affect memory. Regular gentle exercise can help improve your mood and give you more energy. Simple relaxation techniques may help relieve symptoms of anxiety Try to get back to completing activities or hobbies you enjoyed doing in the past. Learn to pace yourself through activities to decrease fatigue. Find out about some local support groups where you can share experiences with others. Try and achieve 7-8 hours of sleep at night.CPAP and BIPAP Information CPAP and BIPAP are methods that use air pressure to keep your airways open and to help you breathe well. CPAP and BIPAP use different amounts of pressure. Your health care provider will tell you whether CPAP or BIPAP would be more helpful for you. CPAP stands for "continuous positive airway pressure." With CPAP, the amount of pressure stays the same while you breathe in (inhale) and out (exhale). BIPAP stands for "bi-level positive airway pressure." With BIPAP, the amount of pressure will be higher when you inhale and lower when you exhale. This allows you to take larger breaths. CPAP or BIPAP may be used in the hospital, or your health care provider may want you to use it at home. You may need to have a sleep study before your health care provider can order a machine for you to use at home. What are the advantages? CPAP or BIPAP can be helpful if you have: Sleep apnea. Chronic obstructive pulmonary disease (COPD). Heart  failure. Medical conditions that cause muscle weakness, including muscular dystrophy or amyotrophic lateral sclerosis (ALS). Other problems that cause breathing to be shallow, weak, abnormal, or difficult. CPAP and BIPAP are most commonly used for obstructive sleep apnea (OSA) to keep the airways from collapsing when the muscles relax during sleep. What are the risks? Generally, this is a safe treatment. However, problems may occur, including: Irritated skin or skin sores if the mask does not fit properly. Dry or stuffy nose  or nosebleeds. Dry mouth. Feeling gassy or bloated. Sinus or lung infection if the equipment is not cleaned properly. When should CPAP or BIPAP be used? In most cases, the mask only needs to be worn during sleep. Generally, the mask needs to be worn throughout the night and during any daytime naps. People with certain medical conditions may also need to wear the mask at other times, such as when they are awake. Follow instructions from your health care provider about when to use the machine. What happens during CPAP or BIPAP?  Both CPAP and BIPAP are provided by a small machine with a flexible plastic tube that attaches to a plastic mask that you wear. Air is blown through the mask into your nose or mouth. The amount of pressure that is used to blow the air can be adjusted on the machine. Your health care provider will set the pressure setting and help you find the best mask for you. Tips for using the mask Because the mask needs to be snug, some people feel trapped or closed-in (claustrophobic) when first using the mask. If you feel this way, you may need to get used to the mask. One way to do this is to hold the mask loosely over your nose or mouth and then gradually apply the mask more snugly. You can also gradually increase the amount of time that you use the mask. Masks are available in various types and sizes. If your mask does not fit well, talk with your health care provider about getting a different one. Some common types of masks include: Full face masks, which fit over the mouth and nose. Nasal masks, which fit over the nose. Nasal pillow or prong masks, which fit into the nostrils. If you are using a mask that fits over your nose and you tend to breathe through your mouth, a chin strap may be applied to help keep your mouth closed. Use a skin barrier to protect your skin as told by your health care provider. Some CPAP and BIPAP machines have alarms that may sound if the mask comes off or  develops a leak. If you have trouble with the mask, it is very important that you talk with your health care provider about finding a way to make the mask easier to tolerate. Do not stop using the mask. There could be a negative impact on your health if you stop using the mask. Tips for using the machine Place your CPAP or BIPAP machine on a secure table or stand near an electrical outlet. Know where the on/off switch is on the machine. Follow instructions from your health care provider about how to set the pressure on your machine and when you should use it. Do not eat or drink while the CPAP or BIPAP machine is on. Food or fluids could get pushed into your lungs by the pressure of the CPAP or BIPAP. For home use, CPAP and BIPAP machines can be rented or purchased through home health care companies. Many different  brands of machines are available. Renting a machine before purchasing may help you find out which particular machine works well for you. Your health insurance company may also decide which machine you may get. Keep the CPAP or BIPAP machine and attachments clean. Ask your health care provider for specific instructions. Check the humidifier if you have a dry stuffy nose or nosebleeds. Make sure it is working correctly. Follow these instructions at home: Take over-the-counter and prescription medicines only as told by your health care provider. Ask if you can take sinus medicine if your sinuses are blocked. Do not use any products that contain nicotine or tobacco. These products include cigarettes, chewing tobacco, and vaping devices, such as e-cigarettes. If you need help quitting, ask your health care provider. Keep all follow-up visits. This is important. Contact a health care provider if: You have redness or pressure sores on your head, face, mouth, or nose from the mask or head gear. You have trouble using the CPAP or BIPAP machine. You cannot tolerate wearing the CPAP or BIPAP  mask. Someone tells you that you snore even when wearing your CPAP or BIPAP. Get help right away if: You have trouble breathing. You feel confused. Summary CPAP and BIPAP are methods that use air pressure to keep your airways open and to help you breathe well. If you have trouble with the mask, it is very important that you talk with your health care provider about finding a way to make the mask easier to tolerate. Do not stop using the mask. There could be a negative impact to your health if you stop using the mask. Follow instructions from your health care provider about when to use the machine. This information is not intended to replace advice given to you by your health care provider. Make sure you discuss any questions you have with your health care provider. Document Revised: 04/29/2021 Document Reviewed: 08/29/2020 [employer] Patient Education  [place].

## 2022-11-09 NOTE — Progress Notes (Signed)
SLEEP MEDICINE CLINIC    Provider:  Larey Seat, MD  Primary Care Physician:  Dalton Organ., MD [address] [place] [address]     Referring Provider: Dr Dalton Gip, MD         Chief Complaint according to patient   Patient presents with:     New Patient (Initial Visit)     presents today to addres if pressures are set where need to be. DME [employer], machine set up 11/30/16  Chief concern according to patient : I am too sleepy, too fatigued to function, had lots of health problems in the lat 8 months.       HISTORY OF PRESENT ILLNESS:    Dalton Mitchell is a 77  Year-old Caucasian male patient seen here on 11/09/2022. in the presence of his wife:  He has significant pain impairing his gait, he gaining weight losing muscle, he is more sleepy, has hypoxia at night. CKD.  His PAP compliance is off because he falls asleep surprisingly promptly, and its not yet in place.  His compliance was 56% for the last 90 days and on many of these 50 days of use he did not make it quite to 4-hour.  The average user time right now is only 2 hours 2 minutes among all nights and 3 hours 40 minutes among the nights of use so apnea is still very well-controlled because his AHI when he uses CPAP is between 3.7 and 3.9/h which is a good resolution.  Pressure need is around 10 cm water and I do not see central apneas arising which is also a good sign his machine has a window between 7 and 17 cm water pressure with 3 cm expiratory relief.  So I like for him to take his sleep aid in bed after the CPAP is ready to be placed.  #2 memory memory certainly can suffer from hypoxemia as well he feels so excessively daytime sleepy but he may also be not alert enough to take information in and retrieve it. He struggles also with depression,    04-19-2022;  He has had some interims medical history- had visited a friend out[place]of[place]state ( [place] Mitchell) and developed severe pain abdominal,  emesis, vomiting, febrile-  gallbladder symptoms, surgery was needed and he had gangrenous gallbladder, developed peritonitis, November  2022, 2 months ago he had pain in the lower ribcage on the right- was diagnosed with an infection, there was fecal matter encapsulated in his abdomen. He is still part time landscaping: he is very fatigued, as well as sleepy . he stated his Epworth score was 12/ 24 points, but he does nap- daily- and more than once. He feels not like getting up and may nest in bed- some depression.  FSS at 45/ 63 points.  He feels better after using CPAP but he also dependent on sleep aids now . Revolving sleep aid- sleep onset before CPAP is in place. He wants to use a chin strap or mouth tape.  Compliance was lower than ever, 43 %, AHI 2.1/h, 95% pressure at 8.7 cm water- high leak.   The patient is well known to [place] and our sleep clinic. Here for yearly CPAP f/u. Pt reports his cpap power cord burned out and has not been able to use it. He is a father of 47 and has over 67 grand and great grandchildren.  Chief concern according to patient :  I am seeing Dalton Mitchell today ,  06-10-2021, in a sleep clinic yearly follow up visit. He is in good mood,and talking fluently and without SOB. Travelled to [place] in summer of this year and developed some abdominal pain, he ws driving with his wife who convinced him to present to a local hospital,  He was hospitalized in June 22 in [place] , [place], had developed a gangrenous gallbladder, had robotic surgery. Chronic kidney disease, cardiac history, OSA with hypoxia.  He has w questions about INSPIRE device but he is not the right type of apnea ( hypoxia) and he has been too heavy.  His PAP machine recently puffed and smoked - he needs a new machine.   Last sleep study was just in 2021, his machine was reset , not replaced following this study,  Dalton Mitchell ordered the study. I reset to 7 through 16 cm water pressure. The machine  'died' in 2021-03-27. He has reached an AHI of 1.2/h but often remove the mask early, after 2-3 hours.  He needed 4 cm water pressure to hit the sweet spot.  ----    I am seeing Dalton Mitchell today, a  right -handed White or Caucasian male with a known sleep disorder.  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  Dalton Mitchell is seen here on 10 June 2020 to follow-up on his most recent sleep study which took place on 15 June the patient's current CPAP machine could be set to auto titration mode    I have the pressure between 7 and 16 cmH2O and 1 cm EPR under the use of a ResMed F 20 large size fullface mask.  He did respond positively to 14 cmH2O pressure his previous baseline sleep study followed in over no was prolonged.  Of hypoxia while on CPAP.  AHI resolved to 0.0/h under 14 cmH2O pressure sleep efficiency was 86%, and he is now meeting to see if he could adhere to this regimen.  He has used the machine 24 out of 30 days, average use at time is 4 hours 55 minutes, 69% he is using the machine over 4 hours but there were 4 days where he was under the 4-hour mark.  His residual AHI is excellent 2.2/h which is a significant reduction to baseline he does not have a significant central apnea problem with in his upper residual apneas, and his 95th percentile pressure was 13 cmH2O 95th percentile air leakage was at 33 L/min and this may be further reducible. Currently using an Airfit F10 with magnetic snaps.    The patient had the first sleep study in January of the year 2018: upon Dalton Dalton Mitchell referral .    Dalton Mitchell had his last sleep studies results in January 2018 on the eighth after being referred  from Dalton. Brigitte Mitchell his primary care physician he was diagnosed with a rather mild Mitchell of apnea with an AHI of 11.6, REM AHI was 32.2 making the use of positive airway pressure necessary.  Also the patient had significant oxygen desaturation during the sleep study he had 164 minutes with an oxygen saturation below 9089%.  He had also a lot of limb movements at night so we asked him to come back for a CPAP titration on 31 January his apnea was significantly reduced  but we noticed cardiac arrhythmia, he is still had few PLM's at the beginning of the titration but his limb movements were much alleviated when he reached his final titration of 10 cmH2O with an AHI of 0.0 and his machine was prescribed as an AutoSet with a pressure pressure range between 7 and 12 cmH2O 1 cm expiratory pressure relief.  He has been 83% compliant using the machine 25 out of 30 days with an average on days used of 5 hours 28 minutes.  He reached an AHI residual of 1.8/h which is a good resolution.  There were no central apneas noted his 95th percentile pressure however is increased to 11.4 cmH2O which would correspond with the highest amount of pressure allowed in his pressure window equal to 12 cm water.  He does have a lot of air leakage and I think we have to invest time in finding the best possible sealing mask for him.  I also would be happy to set up his pressure to a maximum of 13 rather with more expiratory pressure relief.  Dalton. Einar Mitchell mentioned that the patient was markedly more fatigued had dyspnea no energy and did not feel overall very well.  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  Current GFR is under 30 mL/min recent laboratories were quoted below,.     Social history: Patient is working as a Development worker, international aid and lives in a household with his spouse, he has a large family.  Family  status is married , with 7 living  children, many grandchildren.  One son committed suicide. 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 . Pets are present. 1 cat.  Tobacco use- quit 1970.  ETOH use none , Caffeine intake in form of Coffee (none ) but rarely drinking sodas. Hobbies :gardening    Sleep habits are as follows: The patient's dinner time is between 5-6 PM. The patient goes to bed at 10-11 PM but he often can't sleep-  and continues to sleep for 3-4 hours, he wakes rarely for bathroom breaks, wakes often spontaneously at 3-4  AM.   The preferred sleep position is supine , with the support of 1-2 pillows. Dreams are reportedly  frequent/vivid.  5-6AM is the usual rise time. He reports not feeling refreshed or restored in AM, with symptoms such as dry mouth, but no morning headaches, and always residual fatigue. Naps are taken frequently, lasting from 30-60 minutes and are more refreshing than nocturnal sleep.    Review of Systems: Out of a complete 14 system review, the patient complains of only the following symptoms, and all other reviewed systems are negative.:  Fatigue, sleepiness , snoring, fragmented sleep, Insomnia -    How likely are you to doze in the following situations: 0 = not likely, 1 = slight chance, 2 = moderate chance, 3 = high chance   Sitting and Reading? Watching Television? Sitting inactive in a public place (theater or meeting)? As a passenger in a car for an hour without a break? Lying down in the afternoon when circumstances permit? Sitting and talking to someone? Sitting quietly after lunch without alcohol? In a car, while stopped for a few minutes in traffic?   Total = 12/ 24 points   FSS endorsed at  45 from 60/ 63 points.   Severe Depression : 9/15  points  Social History   Socioeconomic History   Marital status: Married    Spouse name: Not on file   Number of children: 8   Years of education: college   Highest education  level: Not on file  Occupational History   Occupation: Retired    Occupation: [employer]   Tobacco Use   Smoking status: Former    Packs/day: 1.00    Years: 4.00    Total pack years: 4.00    Types: Cigarettes    Quit date: 1970    Years since quitting: 54.1   Smokeless tobacco: Never   Tobacco comments:    Quit Engineer, civil (consulting) Use: Never used  Substance and Sexual Activity   Alcohol use: No   Drug use: Never   Sexual activity: Not Currently  Other Topics Concern   Not on file  Social History Narrative   Drinks about 1-2 caffeine beverages a day    Social Determinants of Radio broadcast assistant Strain: Not on file  Food Insecurity: Not on file  Transportation Needs: Not on file  Physical Activity: Not on file  Stress: Not on file  Social Connections: Not on file    Family History  Problem Relation Age of Onset   Emphysema Mother    Colon cancer Maternal Grandmother    Colon polyps Maternal Grandmother    Esophageal cancer Neg Hx    Rectal cancer Neg Hx    Stomach cancer Neg Hx     Past Medical History:  Diagnosis Date   Adenomatous colon polyp    Allergy    seasonal   Arthritis    Chronic kidney disease, stage III (GFR 30-59 ml/min)    Depression    past in early 90s   GERD 02/24/2008   Gout    History of kidney stones    Hypothyroidism    Obesity (BMI 30-39.9)    Old inferior wall myocardial infarction    Sleep apnea    USES CPAP OCCASIONALLY   Unstable angina ([place]) 09/13/2013    Past Surgical History:  Procedure Laterality Date   CHOLECYSTECTOMY     COLONOSCOPY  12/25/2014   [place] previously   [place]   CORONARY ARTERY BYPASS GRAFT N/A 09/17/2013   Procedure: CORONARY ARTERY BYPASS GRAFTING (CABG);  Surgeon: Melrose Nakayama, MD;  Location: [facility];  Service: Open Heart Surgery;  Laterality: N/A;   CYSTOSCOPY WITH RETROGRADE PYELOGRAM, URETEROSCOPY AND STENT  PLACEMENT Right 01/11/2014   Procedure: CYSTOSCOPY WITH RETROGRADE PYELOGRAM, URETEROSCOPY AND STENT PLACEMENT;  Surgeon: Molli Hazard, MD;  Location: [facility];  Service: Urology;  Laterality: Right;   CYSTOSCOPY WITH RETROGRADE PYELOGRAM, URETEROSCOPY AND STENT PLACEMENT Right 02/01/2014   Procedure: CYSTOSCOPY WITH RETROGRADE PYELOGRAM, URETEROSCOPY AND STENT EXCHANGE;  Surgeon: Molli Hazard, MD;  Location: [facility];  Service: Urology;  Laterality: Right;   EXPLORATION POST OPERATIVE OPEN HEART N/A 09/17/2013   Procedure: EXPLORATION POST OPERATIVE OPEN HEART;  Surgeon: Melrose Nakayama, MD;  Location: [facility];  Service: Open Heart Surgery;  Laterality: N/A;   HOLMIUM LASER APPLICATION Right 95/62/1308   Procedure: HOLMIUM LASER APPLICATION;  Surgeon: Molli Hazard, MD;  Location: [facility];  Service: Urology;  Laterality: Right;   IR RADIOLOGIST EVAL & MGMT  01/12/2022   LEFT HEART CATHETERIZATION WITH CORONARY ANGIOGRAM N/A 09/14/2013   Procedure: LEFT HEART CATHETERIZATION WITH CORONARY ANGIOGRAM;  Surgeon: Jacolyn Reedy, MD;  Location: [facility];  Service: Cardiovascular;  Laterality: N/A;   POLYPECTOMY  2022     Current Outpatient Medications on File Prior to Visit  Medication Sig Dispense Refill   Acetaminophen (TYLENOL EXTRA STRENGTH PO) Take by mouth.     allopurinol (ZYLOPRIM) 300 MG tablet Take 300 mg by mouth at bedtime.     ALPRAZolam (XANAX) 0.5 MG tablet Take 1 tablet by mouth as needed.     aspirin EC 325 MG tablet Take 325 mg by mouth at bedtime.     cetirizine (ZYRTEC) 10 MG tablet Take 10 mg by mouth daily.     Cholecalciferol (VITAMIN D-3) 25 MCG (1000 UT) CAPS Take 1 capsule by mouth daily at 2 PM.     Coenzyme Q10 (COQ-10 PO) Take 100 mg by mouth daily.     Dextromethorphan-guaiFENesin 10-200 MG/15ML LIQD Take by mouth.     escitalopram (LEXAPRO) 10 MG tablet Take by mouth.     ezetimibe (ZETIA) 10 MG tablet Take 10 mg by mouth daily.      famotidine (PEPCID) 20 MG tablet Take 20 mg by mouth 2 (two) times daily.     FLUTICASONE PROPIONATE, NASAL, NA Place 2 Inhalers into the nose daily.     Levothyroxine Sodium 100 MCG CAPS Take 100 mcg by mouth daily before breakfast.     loratadine (CLARITIN) 10 MG tablet Take 10 mg by mouth daily.     losartan (COZAAR) 25 MG tablet Take 1 tablet (25 mg total) by mouth every evening. 90 tablet 3   metoprolol tartrate (LOPRESSOR) 25 MG tablet Take 1 tablet (25 mg total) by mouth 2 (two) times daily. 180 tablet 3   oxyCODONE-acetaminophen (PERCOCET/ROXICET) 5-325 MG tablet Take 1 tablet by mouth as needed.     Probiotic Product (PROBIOTIC-10 PO) Take by mouth.     rosuvastatin (CRESTOR) 20 MG tablet Take 20 mg by mouth daily.     tamsulosin (FLOMAX) 0.4 MG CAPS capsule Take 0.4 mg by mouth daily after supper.     traZODone (DESYREL) 50 MG tablet Take 50 mg by mouth at bedtime.     Ubiquinol 100 MG CAPS Take 1 capsule by mouth daily at 2 PM.     No current facility-administered medications on file prior to visit.    Allergies  Allergen Reactions   Bupropion     Other reaction(s): hallucinations   Morphine And Related Other (See Comments)    STATES HORRIBLE HALLUCINATIONS.   Ace Inhibitors Cough   Lisinopril Other (See Comments) and Cough    Physical exam:  Today's Vitals   11/09/22 1559  BP: 114/75  Mitchell: 71  Weight: 252 lb 6.4 oz (114.5 kg)  Height: '5\' 8"'$  (1.727 m)   Body mass index is 38.38 kg/m.   Wt Readings from Last 3 Encounters:  11/09/22 252 lb 6.4 oz (114.5 kg)  08/10/22 245 lb (111.1 kg)  06/14/22 244 lb 6.4 oz (110.9 kg)     Ht Readings from Last 3 Encounters:  11/09/22 '5\' 8"'$  (1.727 m)  08/10/22 '5\' 9"'$  (1.753 m)  06/14/22 '5\' 9"'$  (1.753 m)      General: The patient is awake, alert and appears not in acute distress. The patient is well groomed. Head: Normocephalic, atraumatic. Neck is supple. Mallampati 3,  neck circumference: 19 inches- unchanged for years .   Dental status: no dentures.  Cardiovascular:  Regular rate and cardiac rhythm by Mitchell, without distended neck veins.  Respiratory: Lungs are clear to auscultation, but restricted capacity.  Skin:  With evidence of ankle edema, or rash. Trunk: The patient's posture is erect. BMI 38 , up from 36 last visit.     Neurologic exam : The patient is awake and alert, oriented to place and time.   Memory subjective described as impaired,  holds a fluent conversation. Wife reports he can't remember short term, going to the kitchen and can't remember why, can't come up with the names of movie stars or singers. He noted sometimes a delay and some memory returns.   Attention span & concentration ability appears normal.  Speech is fluent,  without dysarthria, with mild dysphonia.  Mood and affect are appropriate.   Cranial nerves: no loss of smell or taste reported  Pupils are equal and briskly reactive to light. Funduscopic exam deferred. .  Extraocular movements in vertical and horizontal planes were intact and without nystagmus. No Diplopia. Visual fields by finger perimetry are intact. Hearing was impaired-. Facial motor strength is symmetric and tongue and uvula move midline.  Neck ROM : rotation, tilt and flexion extension were normal for age and shoulder shrug was symmetrical.    Motor exam:  Symmetric bulk, tone and ROM.   Normal tone without cog-wheeling, symmetric grip strength . Sensory:  Fine touch and vibration were not felt over either knee-  Proprioception tested in the upper extremities was normal. Coordination: Rapid alternating movements in the fingers/hands were of normal speed.  The Finger-to-nose maneuver was intact without evidence of ataxia, dysmetria or tremor. Mild pronator drift.  Gait and station: Patient could rise unassisted from a seated position, walked stooped with a limp, without assistive device. He needs to brace himself to rise.  Stance is of normal width/ base .   Toe and heel walk were deferred.  Deep tendon reflexes: in the upper and lower extremities are symmetric- no patella reflexes obtained.  Babinski response was deferred.      After spending a total time of 25 minutes face to face and for physical and neurologic examination, review of laboratory studies,  personal review of imaging studies, reports and results of other testing and review of referral information / records as far as provided in visit, I have established the following assessments:   0) Mrs Lenderman was here today to share her concern about her husbands memory. Decreased multitasking ability and some hearing deficits. He is also depressed, and lacks energy, sleepiness increased , CPAP compliance is down.  1)apnea - his PAP machine works well for his apnea - he may benefit from more pressure, but the residual AHI is low - air leaks remain but mask is comfortable, I will let it be. Machine was set up in Spring, 5-3- 2023.  He dislikes the smaller water container.  He would like a higher pressure setting - will change to 7 through 17 cm water pressure-, keep 3 cm epr.  FFM. 2) there is still depression, underlying- this contributes to fatigue.   3) his CKD  will certainly contribute to fatigue.  4) arthritis and pain, keeping him less active..     My Plan is to proceed with:  Improving compliance with a chin strap-  take sleep aid right before you put CPAP on.  Treat depression, exercise as much as tolerated, and we will test memory again.   In short, IVON OELKERS is presenting with excessive fatigue, a symptom that can be attributed to many of his chronic conditions, but  seem not related to apnea per see.    I plan to follow up either personally or through our NP within 3 month.   CC Dalton Dalton Gip, MD     Electronically signed by: Dalton Seat, MD 11/09/2022 4:14 PM  [facility] Board certified by The [employer] of Sleep  Medicine and Diplomate of the [employer] of Sleep Medicine. Board certified In Neurology through the [place], Fellow of the [employer] of Neurology. Medical Director of [facility].

## 2022-11-12 DIAGNOSIS — G4733 Obstructive sleep apnea (adult) (pediatric): Secondary | ICD-10-CM | POA: Diagnosis not present

## 2022-12-07 ENCOUNTER — Other Ambulatory Visit: Payer: Self-pay

## 2022-12-07 DIAGNOSIS — I2510 Atherosclerotic heart disease of native coronary artery without angina pectoris: Secondary | ICD-10-CM

## 2022-12-07 MED ORDER — METOPROLOL TARTRATE 25 MG PO TABS: 25.0000 mg | ORAL_TABLET | Freq: Two times a day (BID) | ORAL | 3 refills | Status: DC

## 2022-12-11 DIAGNOSIS — G4733 Obstructive sleep apnea (adult) (pediatric): Secondary | ICD-10-CM | POA: Diagnosis not present

## 2022-12-15 ENCOUNTER — Other Ambulatory Visit: Payer: Self-pay

## 2022-12-15 DIAGNOSIS — I2510 Atherosclerotic heart disease of native coronary artery without angina pectoris: Secondary | ICD-10-CM

## 2022-12-15 MED ORDER — METOPROLOL TARTRATE 25 MG PO TABS: 25.0000 mg | ORAL_TABLET | Freq: Two times a day (BID) | ORAL | 3 refills | Status: DC

## 2022-12-20 DIAGNOSIS — I129 Hypertensive chronic kidney disease with stage 1 through stage 4 chronic kidney disease, or unspecified chronic kidney disease: Secondary | ICD-10-CM | POA: Diagnosis not present

## 2022-12-20 DIAGNOSIS — N200 Calculus of kidney: Secondary | ICD-10-CM | POA: Diagnosis not present

## 2022-12-20 DIAGNOSIS — N2581 Secondary hyperparathyroidism of renal origin: Secondary | ICD-10-CM | POA: Diagnosis not present

## 2022-12-20 DIAGNOSIS — R768 Other specified abnormal immunological findings in serum: Secondary | ICD-10-CM | POA: Diagnosis not present

## 2022-12-20 DIAGNOSIS — N1832 Chronic kidney disease, stage 3b: Secondary | ICD-10-CM | POA: Diagnosis not present

## 2022-12-20 DIAGNOSIS — N400 Enlarged prostate without lower urinary tract symptoms: Secondary | ICD-10-CM | POA: Diagnosis not present

## 2022-12-20 DIAGNOSIS — R809 Proteinuria, unspecified: Secondary | ICD-10-CM | POA: Diagnosis not present

## 2022-12-20 DIAGNOSIS — N2889 Other specified disorders of kidney and ureter: Secondary | ICD-10-CM | POA: Diagnosis not present

## 2022-12-27 DIAGNOSIS — G4733 Obstructive sleep apnea (adult) (pediatric): Secondary | ICD-10-CM | POA: Diagnosis not present

## 2023-01-04 DIAGNOSIS — D235 Other benign neoplasm of skin of trunk: Secondary | ICD-10-CM | POA: Diagnosis not present

## 2023-01-04 DIAGNOSIS — L814 Other melanin hyperpigmentation: Secondary | ICD-10-CM | POA: Diagnosis not present

## 2023-01-04 DIAGNOSIS — L570 Actinic keratosis: Secondary | ICD-10-CM | POA: Diagnosis not present

## 2023-01-04 DIAGNOSIS — L821 Other seborrheic keratosis: Secondary | ICD-10-CM | POA: Diagnosis not present

## 2023-01-04 DIAGNOSIS — L818 Other specified disorders of pigmentation: Secondary | ICD-10-CM | POA: Diagnosis not present

## 2023-01-04 DIAGNOSIS — D692 Other nonthrombocytopenic purpura: Secondary | ICD-10-CM | POA: Diagnosis not present

## 2023-01-04 DIAGNOSIS — Z7189 Other specified counseling: Secondary | ICD-10-CM | POA: Diagnosis not present

## 2023-01-04 DIAGNOSIS — Q833 Accessory nipple: Secondary | ICD-10-CM | POA: Diagnosis not present

## 2023-01-11 DIAGNOSIS — G4733 Obstructive sleep apnea (adult) (pediatric): Secondary | ICD-10-CM | POA: Diagnosis not present

## 2023-01-14 NOTE — Progress Notes (Signed)
Office Visit Note  Patient: Dalton Mitchell             Date of Birth: 04-07-1946           MRN: [id number]             PCP: Cleatis Polka., MD Referring: Estanislado Emms, MD Visit Date: 01/28/2023 Occupation: @GUAROCC @  Subjective: Positive ANA, fatigue  History of Present Illness: Dalton Mitchell is a 77 y.o. male seen in consultation Dr. Malen Gauze.  Patient was initially seen by me in 2006 for gout.  After evaluation and establishing the diagnosis he was placed on colchicine and allopurinol.  His last visit was in December 2014.  In 2013 he fell off a ladder and since then he was experiencing right hip joint pain.  He was having intermittent gout flares at the time.  At the time he had trochanteric bursitis and IT band stretches were given.  Labs were also obtained for the management of gout.  According to the patient his gout has been well-controlled over the years.  He now carries the history of chronic kidney disease, coronary artery disease, hypertension.  He has been under care of Dr. Malen Gauze for chronic kidney disease.  He was under care of Dr. Hyman Hopes prior to that.  He had CABG in 2013 after stent placements.  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  During nephrology workup he had ANA which was positive.  He was referred to me for the evaluation of positive ANA.  Patient gives history of fatigue.  There is no history of oral ulcers, nasal ulcers, malar rash, Raynauds, lymphadenopathy.  Gives history of photosensitivity.  Patient states for the last 2 years he has been having increased pain and discomfort in his bilateral knee joints.  He has had cortisone injections in viscosupplement injections by Dr. Dion Saucier.  He is awaiting clearance for bilateral partial knee replacement in the near future.  None of the other joints are painful.  He ambulates with the help of a cane.    Activities of Daily Living:  Patient reports morning stiffness for 0 minutes.    Patient Denies nocturnal pain.  Difficulty dressing/grooming: Denies Difficulty climbing stairs: Denies Difficulty getting out of chair: Denies Difficulty using hands for taps, buttons, cutlery, and/or writing: Denies  Review of Systems  Constitutional:  Positive for fatigue.  HENT:  Negative for mouth sores and mouth dryness.   Eyes:  Positive for dryness.  Respiratory:  Negative for shortness of breath.   Cardiovascular:  Negative for chest pain and palpitations.  Gastrointestinal:  Negative for blood in stool, constipation and diarrhea.  Endocrine: Positive for increased urination.  Genitourinary:  Negative for involuntary urination.  Musculoskeletal:  Positive for joint pain and joint pain. Negative for gait problem, joint swelling, myalgias, muscle weakness, morning stiffness, muscle tenderness and myalgias.  Skin:  Positive for sensitivity to sunlight. Negative for color change and rash.  Allergic/Immunologic: Negative for susceptible to infections.  Neurological:  Negative for dizziness and headaches.  Hematological:  Negative for swollen glands.  Psychiatric/Behavioral:  Positive for depressed mood and sleep disturbance. The patient is not nervous/anxious.     PMFS History:  Patient Active Problem List   Diagnosis Date Noted   MCI (mild cognitive impairment) with memory loss 11/09/2022   Primary hypertension 04/09/2022   Excessive daytime sleepiness 03/28/2020   OSA on CPAP 03/28/2020   Mass of finger of right hand 12/02/2017  Pain in finger of right hand 12/02/2017   Osteoarthritis of finger of right hand 12/02/2017   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   Obesity (BMI 30-39.9)    Old inferior wall myocardial infarction    Sleep apnea    GERD 02/24/2008   Hypothyroidism    Hypercholesteremia    Gout    Coronary artery disease involving native coronary artery of native heart without angina pectoris    Chronic kidney disease (CKD), stage IV (severe) (HCC)    History of kidney  stones     Past Medical History:  Diagnosis Date   Adenomatous colon polyp    Allergy    seasonal   Arthritis    Chronic kidney disease, stage III (GFR 30-59 ml/min)    Depression    past in early 90s   GERD 02/24/2008   Gout    History of kidney stones    Hypothyroidism    Obesity (BMI 30-39.9)    Old inferior wall myocardial infarction    Sleep apnea    USES CPAP OCCASIONALLY   Unstable angina (HCC) 09/13/2013    Family History  Problem Relation Age of Onset   Emphysema Mother    Alcoholism Father    Stroke Father    Colon cancer Maternal Grandmother    Colon polyps Maternal Grandmother    Healthy Son    Healthy Son    Healthy Son    Esophageal cancer Neg Hx    Rectal cancer Neg Hx    Stomach cancer Neg Hx    Past Surgical History:  Procedure Laterality Date   CHOLECYSTECTOMY     COLONOSCOPY  12/25/2014   MANY previously   CORONARY ANGIOPLASTY  1995   CORONARY ANGIOPLASTY WITH STENT PLACEMENT  1996   CORONARY ARTERY BYPASS GRAFT N/A 09/17/2013   Procedure: CORONARY ARTERY BYPASS GRAFTING (CABG);  Surgeon: Loreli Slot, MD;  Location: [facility] OR;  Service: Open Heart Surgery;  Laterality: N/A;   CYSTOSCOPY WITH RETROGRADE PYELOGRAM, URETEROSCOPY AND STENT PLACEMENT Right 01/11/2014   Procedure: CYSTOSCOPY WITH RETROGRADE PYELOGRAM, URETEROSCOPY AND STENT PLACEMENT;  Surgeon: Milford Cage, MD;  Location: [facility] ORS;  Service: Urology;  Laterality: Right;   CYSTOSCOPY WITH RETROGRADE PYELOGRAM, URETEROSCOPY AND STENT PLACEMENT Right 02/01/2014   Procedure: CYSTOSCOPY WITH RETROGRADE PYELOGRAM, URETEROSCOPY AND STENT EXCHANGE;  Surgeon: Milford Cage, MD;  Location: [facility] ORS;  Service: Urology;  Laterality: Right;   EXPLORATION POST OPERATIVE OPEN HEART N/A 09/17/2013   Procedure: EXPLORATION POST OPERATIVE OPEN HEART;  Surgeon: Loreli Slot, MD;  Location: [facility] OR;  Service: Open Heart Surgery;  Laterality: N/A;   HOLMIUM LASER APPLICATION Right  02/01/2014   Procedure: HOLMIUM LASER APPLICATION;  Surgeon: Milford Cage, MD;  Location: [facility] ORS;  Service: Urology;  Laterality: Right;   IR RADIOLOGIST EVAL & MGMT  01/12/2022   LEFT HEART CATHETERIZATION WITH CORONARY ANGIOGRAM N/A 09/14/2013   Procedure: LEFT HEART CATHETERIZATION WITH CORONARY ANGIOGRAM;  Surgeon: Othella Boyer, MD;  Location: [facility] CATH LAB;  Service: Cardiovascular;  Laterality: N/A;   POLYPECTOMY  2022   Social History   Social History Narrative   Drinks about 1-2 caffeine beverages a day    Immunization History  Administered Date(s) Administered   PFIZER(Purple Top)SARS-COV-2 Vaccination 11/24/2019, 12/15/2019   Tdap 12/20/2015     Objective: Vital Signs: BP 131/86 (BP Location: Right Arm, Patient Position: Sitting, Cuff Size: Large)   Pulse 69   Resp 18   Ht 5\' 9"  (  1.753 m)   Wt 255 lb 3.2 oz (115.8 kg)   BMI 37.69 kg/m    Physical Exam Vitals and nursing note reviewed.  Constitutional:      Appearance: He is well-developed.  HENT:     Head: Normocephalic and atraumatic.  Eyes:     Conjunctiva/sclera: Conjunctivae normal.     Pupils: Pupils are equal, round, and reactive to light.  Cardiovascular:     Rate and Rhythm: Normal rate and regular rhythm.     Heart sounds: Normal heart sounds.  Pulmonary:     Effort: Pulmonary effort is normal.     Breath sounds: Normal breath sounds.  Abdominal:     General: Bowel sounds are normal.     Palpations: Abdomen is soft.  Musculoskeletal:     Cervical back: Normal range of motion and neck supple.  Skin:    General: Skin is warm and dry.     Capillary Refill: Capillary refill takes less than 2 seconds.  Neurological:     Mental Status: He is alert and oriented to person, place, and time.  Psychiatric:        Behavior: Behavior normal.      Musculoskeletal Exam: Cervical spine was in good range of motion.  Lumbar spine was difficult to assess.  Shoulders, elbows, wrists, MCPs PIPs and  DIPs were in good range of motion with no synovitis.  Bilateral PIP and DIP thickening was noted.  Hip joints and knee joints were in good range of motion without any warmth swelling or effusion.  There was no tenderness over ankles or MTPs.  CDAI Exam: CDAI Score: -- Patient Global: --; Provider Global: -- Swollen: --; Tender: -- Joint Exam 01/28/2023   No joint exam has been documented for this visit   There is currently no information documented on the homunculus. Go to the Rheumatology activity and complete the homunculus joint exam.  Investigation: No additional findings.  Imaging: No results found.  Recent Labs: Lab Results  Component Value Date   WBC 11.0 (H) 01/04/2022   HGB 15.0 01/04/2022   PLT 269 01/04/2022   NA 143 05/26/2022   K 5.1 05/26/2022   CL 104 05/26/2022   CO2 25 05/26/2022   GLUCOSE 138 (H) 05/26/2022   BUN 26 05/26/2022   CREATININE 1.91 (H) 05/26/2022   BILITOT 0.6 12/30/2021   ALKPHOS 43 12/30/2021   AST 28 12/30/2021   ALT 23 12/30/2021   PROT 5.6 (L) 12/30/2021   ALBUMIN 2.3 (L) 12/30/2021   CALCIUM 10.6 (H) 05/26/2022   GFRAA 29 (L) 01/25/2014   May 14, 2022 SPEP unremarkable, p-ANCA negative, c-ANCA negative, Speciality Comments: No specialty comments available.  Procedures:  No procedures performed Allergies: Bupropion, Morphine and related, Ace inhibitors, and Lisinopril   Assessment / Plan:     Visit Diagnoses: Positive ANA (antinuclear antibody) - 05/20/22:ANA 1:160 speckled, C3 155, C4 32, ANCA- -I reviewed all previous records.  Patient was referred to me by his nephrologist Dr. Malen Gauze for the evaluation of positive ANA.  At the initial workup Dr. Malen Gauze obtained some labs which showed positive ANA.  Patient gives history of fatigue.  He denies any history of oral ulcers, nasal ulcers, malar rash, Raynaud's, inflammatory arthritis or.  He gives history of photosensitivity.  I will obtain additional labs today.  Use of sunscreen  was advised.  Plan: ANA, RNP Antibody, Anti-Smith antibody, Sjogrens syndrome-A extractable nuclear antibody, Sjogrens syndrome-B extractable nuclear antibody, Anti-DNA antibody, double-stranded, C3 and C4,  Beta-2 glycoprotein antibodies, Cardiolipin antibodies, IgG, IgM, IgA  Idiopathic chronic gout of multiple sites without tophus -I diagnosed him with gout in 2006.[name]  He was taken off the NSAIDs.  He was switched to colchicine and allopurinol.  He has done well on allopurinol over the years.  He states he has not had a gout flare in a long time.  Plan: Uric acid  Primary osteoarthritis of both knees-patient states that he has end-stage severe osteoarthritis involving bilateral knee joints.  He has been followed by Dr. Dion Saucier.  He will require bilateral total knee replacement.  He is awaiting clearance.  Patient states he has tried cortisone and Visco supplement injections in the past.  He ambulates with the help of a cane.  Chronic kidney disease (CKD), stage IV (severe) (HCC)-he is followed by Dr. Malen Gauze.  I reviewed her records according to that she was diagnosed with FSGS.  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  History of gastroesophageal reflux (GERD)  Essential hypertension-blood pressure was normal today at 131/86.  History of hyperlipidemia  S/P CABG x 4  Coronary artery disease involving native coronary artery of native heart without angina pectoris  Old inferior wall myocardial infarction  History of hypothyroidism  History of melanoma - Resected per patient.  No chemotherapy required.  He is still seen by the dermatologist.  He states he had some precancerous lesions on his scalp and is using topical chemotherapy.  MCI (mild cognitive impairment) with memory loss  Excessive daytime sleepiness  History of nephrolithiasis  OSA on CPAP  Orders: Orders Placed This Encounter  Procedures   ANA   RNP Antibody   Anti-Smith antibody   Sjogrens  syndrome-A extractable nuclear antibody   Sjogrens syndrome-B extractable nuclear antibody   Anti-DNA antibody, double-stranded   C3 and C4   Beta-2 glycoprotein antibodies   Cardiolipin antibodies, IgG, IgM, IgA   Uric acid   No orders of the defined types were placed in this encounter.   Face-to-face time spent with patient was 45 minutes. Greater than 50% of time was spent in counseling and coordination of care.  Follow-Up Instructions: Return for Osteoarthritis, Gout, +ANA.   Pollyann Savoy, MD  Note - This record has been created using Animal nutritionist.  Chart creation errors have been sought, but may not always  have been located. Such creation errors do not reflect on  the standard of medical care.

## 2023-01-18 IMAGING — DX DG CHEST 2V
2 series · 2 of 2 positions shown · non-contrast
Comparison: 10/24/2021

CLINICAL DATA: Right upper quadrant abdominal pain.

EXAM:
CHEST - 2 VIEW

[w chest lat]
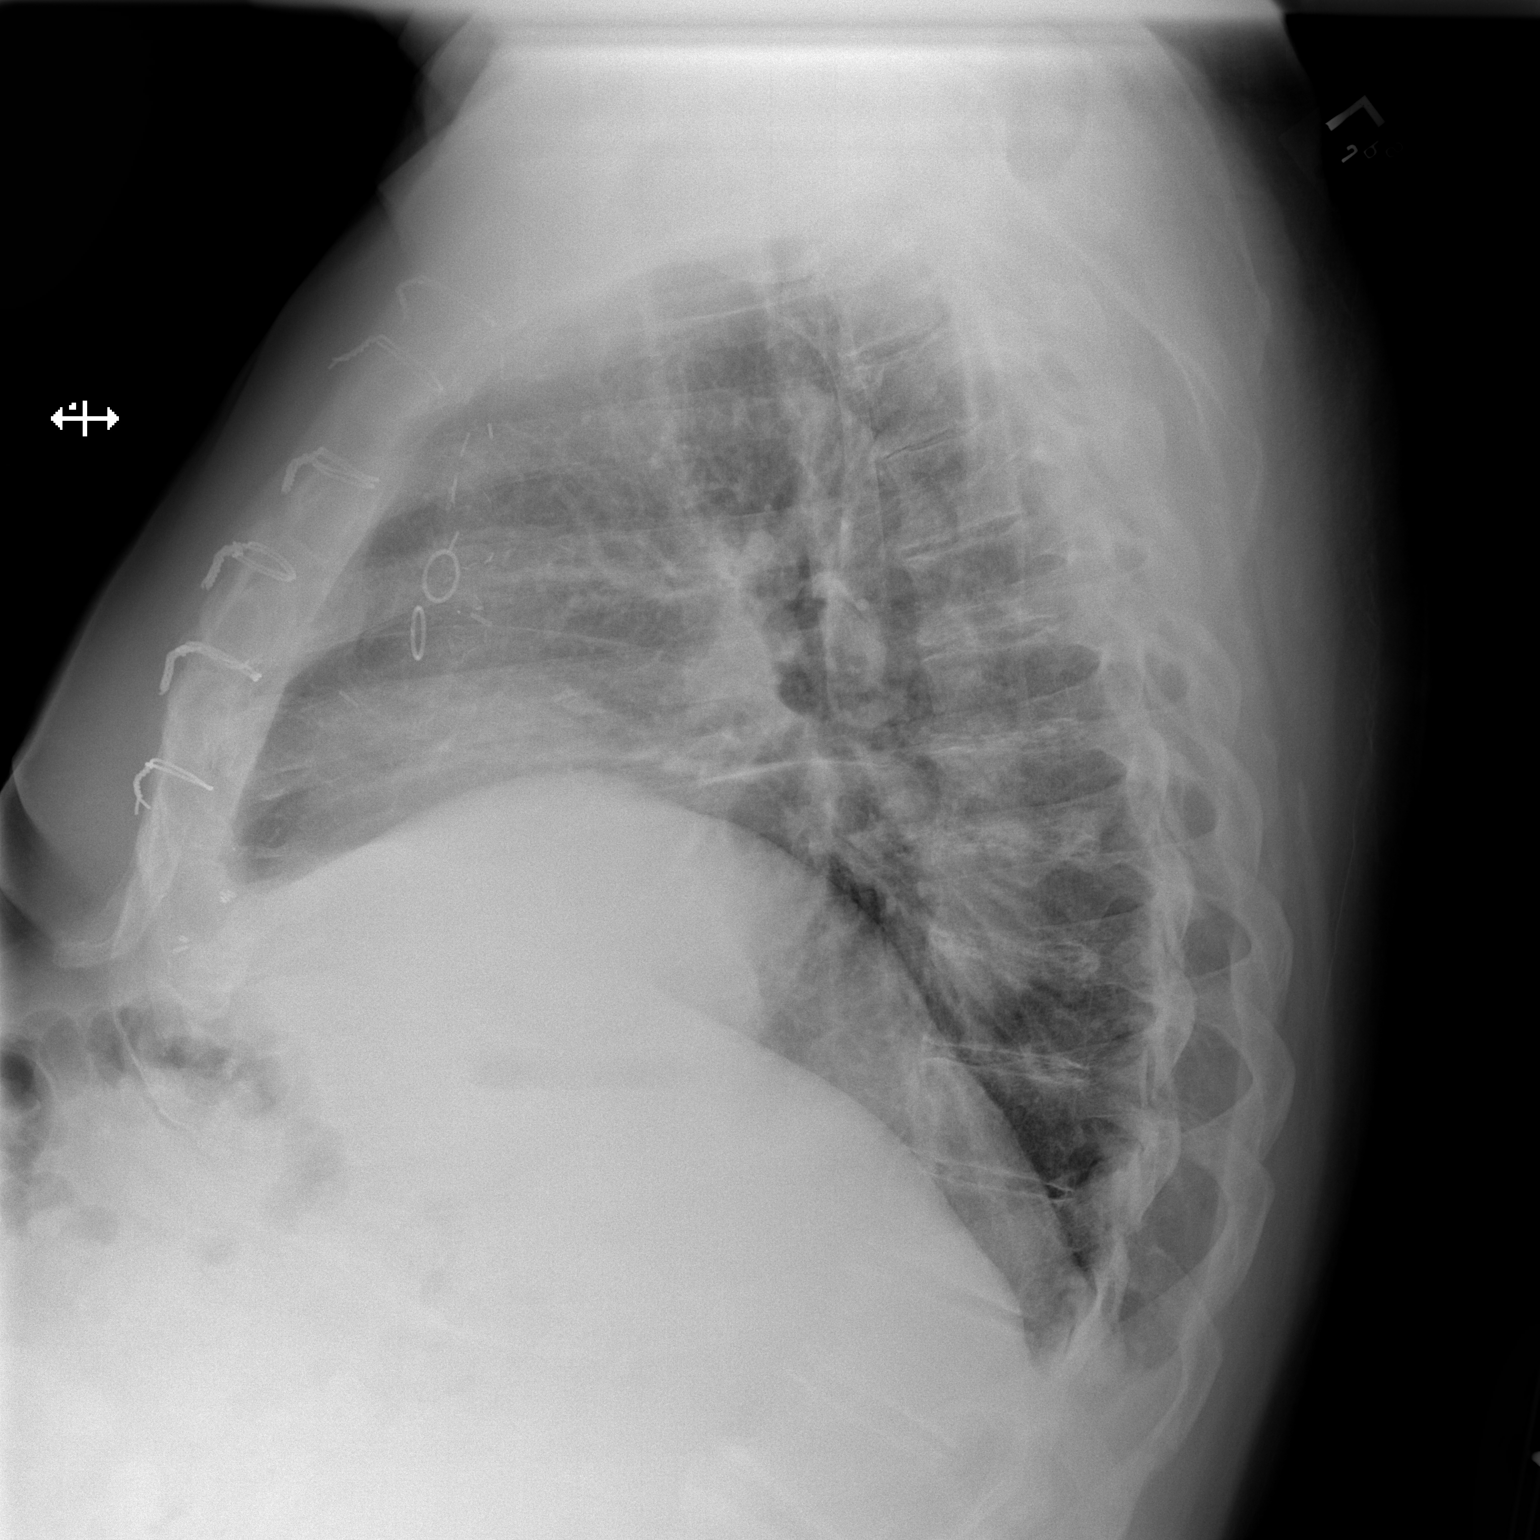

[w chest pa]
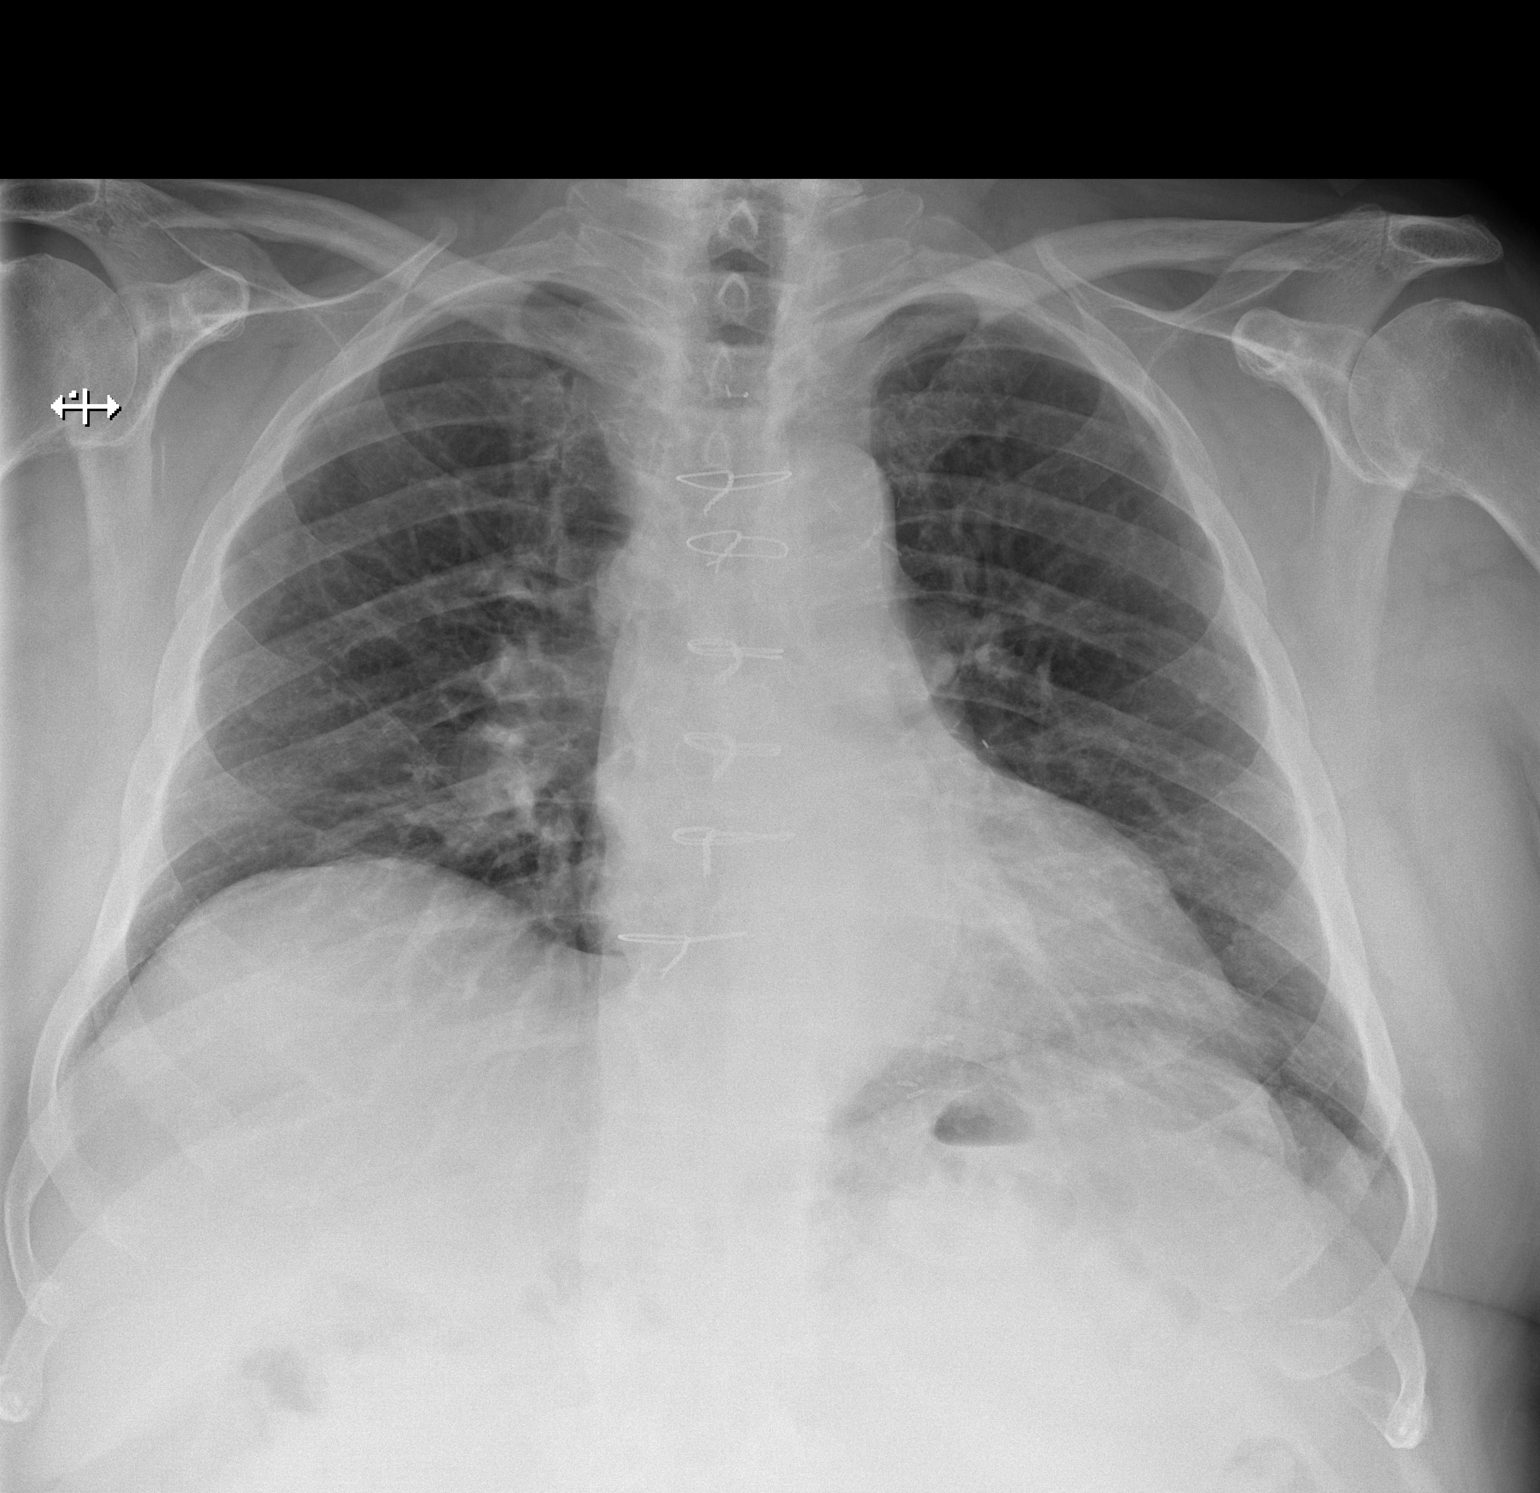

[2 of 2 positions shown; findings below may reference images not displayed]

FINDINGS: The cardiac silhouette, mediastinal and hilar contours are within
normal limits. Stable mild tortuosity and calcification of the
thoracic aorta. Stable surgical changes from bypass surgery.

Stable eventration of the right hemidiaphragm with overlying
vascular crowding and streaky atelectasis. No infiltrates or
effusions. No pulmonary lesions. The bony thorax is intact.
IMPRESSION: No acute cardiopulmonary findings.

## 2023-01-28 ENCOUNTER — Encounter: Payer: Self-pay | Admitting: Rheumatology

## 2023-01-28 ENCOUNTER — Ambulatory Visit: Payer: PPO | Attending: Rheumatology | Admitting: Rheumatology

## 2023-01-28 VITALS — BP 131/86 | HR 69 | Resp 18 | Ht 69.0 in | Wt 255.2 lb

## 2023-01-28 DIAGNOSIS — M1A09X0 Idiopathic chronic gout, multiple sites, without tophus (tophi): Secondary | ICD-10-CM | POA: Diagnosis not present

## 2023-01-28 DIAGNOSIS — Z87442 Personal history of urinary calculi: Secondary | ICD-10-CM

## 2023-01-28 DIAGNOSIS — G3184 Mild cognitive impairment, so stated: Secondary | ICD-10-CM

## 2023-01-28 DIAGNOSIS — Z8719 Personal history of other diseases of the digestive system: Secondary | ICD-10-CM | POA: Diagnosis not present

## 2023-01-28 DIAGNOSIS — I252 Old myocardial infarction: Secondary | ICD-10-CM

## 2023-01-28 DIAGNOSIS — N184 Chronic kidney disease, stage 4 (severe): Secondary | ICD-10-CM | POA: Diagnosis not present

## 2023-01-28 DIAGNOSIS — Z85820 Personal history of malignant melanoma of skin: Secondary | ICD-10-CM | POA: Diagnosis not present

## 2023-01-28 DIAGNOSIS — Z8639 Personal history of other endocrine, nutritional and metabolic disease: Secondary | ICD-10-CM

## 2023-01-28 DIAGNOSIS — I10 Essential (primary) hypertension: Secondary | ICD-10-CM | POA: Diagnosis not present

## 2023-01-28 DIAGNOSIS — G4733 Obstructive sleep apnea (adult) (pediatric): Secondary | ICD-10-CM

## 2023-01-28 DIAGNOSIS — R768 Other specified abnormal immunological findings in serum: Secondary | ICD-10-CM | POA: Diagnosis not present

## 2023-01-28 DIAGNOSIS — M170 Bilateral primary osteoarthritis of knee: Secondary | ICD-10-CM

## 2023-01-28 DIAGNOSIS — Z951 Presence of aortocoronary bypass graft: Secondary | ICD-10-CM

## 2023-01-28 DIAGNOSIS — I2510 Atherosclerotic heart disease of native coronary artery without angina pectoris: Secondary | ICD-10-CM | POA: Diagnosis not present

## 2023-01-28 DIAGNOSIS — G4719 Other hypersomnia: Secondary | ICD-10-CM

## 2023-01-28 DIAGNOSIS — N2581 Secondary hyperparathyroidism of renal origin: Secondary | ICD-10-CM | POA: Diagnosis not present

## 2023-01-29 LAB — URIC ACID: Uric Acid, Serum: 5.5 mg/dL (ref 4.0–8.0)

## 2023-02-02 LAB — BETA-2 GLYCOPROTEIN ANTIBODIES
Beta-2 Glyco 1 IgA: <2 U/mL (ref <20.0)
Beta-2 Glyco 1 IgM: <2 U/mL (ref <20.0)
Beta-2 Glyco I IgG: <2 U/mL (ref <20.0)

## 2023-02-02 LAB — ANA: Anti Nuclear Antibody (ANA): POSITIVE — AB

## 2023-02-02 LAB — C3 AND C4
C3 Complement: 144 mg/dL (ref 82–185)
C4 Complement: 31 mg/dL (ref 15–53)

## 2023-02-02 LAB — SJOGRENS SYNDROME-B EXTRACTABLE NUCLEAR ANTIBODY: SSB (La) (ENA) Antibody, IgG: <1 AI

## 2023-02-02 LAB — CARDIOLIPIN ANTIBODIES, IGG, IGM, IGA
Anticardiolipin IgA: <2 APL-U/mL (ref <20.0)
Anticardiolipin IgG: <2 GPL-U/mL (ref <20.0)
Anticardiolipin IgM: <2 MPL-U/mL (ref <20.0)

## 2023-02-02 LAB — RNP ANTIBODY: Ribonucleic Protein(ENA) Antibody, IgG: <1 AI

## 2023-02-02 LAB — ANTI-DNA ANTIBODY, DOUBLE-STRANDED: ds DNA Ab: 1 IU/mL

## 2023-02-02 LAB — ANTI-SMITH ANTIBODY: ENA SM Ab Ser-aCnc: <1 AI

## 2023-02-02 LAB — ANTI-NUCLEAR AB-TITER (ANA TITER): ANA Titer 1: 1:160 {titer} — ABNORMAL HIGH

## 2023-02-02 LAB — SJOGRENS SYNDROME-A EXTRACTABLE NUCLEAR ANTIBODY: SSA (Ro) (ENA) Antibody, IgG: <1 AI

## 2023-02-02 NOTE — Progress Notes (Signed)
I will discuss results at the follow-up visit.

## 2023-02-04 NOTE — Progress Notes (Signed)
Office Visit Note  Patient: Dalton Mitchell             Date of Birth: 19-Aug-1946           MRN: [id number]             PCP: Cleatis Polka., MD Referring: Estanislado Emms, MD Visit Date: 02/18/2023 Occupation: @GUAROCC @  Subjective:  Positive ANA, knee pain  History of Present Illness: Dalton Mitchell is a 77 y.o. male with history of positive ANA, gout and osteoarthritis.  He states he continues to have severe pain and discomfort in his knee joints.  He has not had a gout flare since he has been on allopurinol.  He has been taking allopurinol on a regular basis.  He states he still needs clearance to get his knee joints replaced.  He denies any history of oral ulcers, nasal ulcers, malar rash, full sensitivity, Raynaud's or inflammatory arthritis.  He has dry mouth which he relates to medication use.    Activities of Daily Living:  Patient reports morning stiffness for all day. Patient Denies nocturnal pain.  Difficulty dressing/grooming: Denies Difficulty climbing stairs: Reports Difficulty getting out of chair: Reports Difficulty using hands for taps, buttons, cutlery, and/or writing: Denies  Review of Systems  Constitutional:  Positive for fatigue.  HENT:  Positive for mouth dryness. Negative for mouth sores.   Eyes:  Negative for dryness.  Respiratory:  Negative for shortness of breath.   Cardiovascular:  Negative for chest pain and palpitations.  Gastrointestinal:  Negative for blood in stool, constipation and diarrhea.  Endocrine: Positive for increased urination.  Genitourinary:  Negative for involuntary urination.  Musculoskeletal:  Positive for joint pain, joint pain, muscle weakness and morning stiffness. Negative for gait problem, joint swelling, myalgias, muscle tenderness and myalgias.  Skin:  Negative for color change, rash and sensitivity to sunlight.  Allergic/Immunologic: Negative for susceptible to infections.  Neurological:  Negative for  dizziness and headaches.  Hematological:  Positive for bruising/bleeding tendency. Negative for swollen glands.  Psychiatric/Behavioral:  Positive for depressed mood and sleep disturbance. The patient is nervous/anxious.     PMFS History:  Patient Active Problem List   Diagnosis Date Noted   MCI (mild cognitive impairment) with memory loss 11/09/2022   Primary hypertension 04/09/2022   Excessive daytime sleepiness 03/28/2020   OSA on CPAP 03/28/2020   Mass of finger of right hand 12/02/2017   Pain in finger of right hand 12/02/2017   Osteoarthritis of finger of right hand 12/02/2017   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   Obesity (BMI 30-39.9)    Old inferior wall myocardial infarction    Sleep apnea    GERD 02/24/2008   Hypothyroidism    Hypercholesteremia    Gout    Coronary artery disease involving native coronary artery of native heart without angina pectoris    Chronic kidney disease (CKD), stage IV (severe) (HCC)    History of kidney stones     Past Medical History:  Diagnosis Date   Adenomatous colon polyp    Allergy    seasonal   Arthritis    Chronic kidney disease, stage III (GFR 30-59 ml/min)    Depression    past in early 90s   GERD 02/24/2008   Gout    History of kidney stones    Hypothyroidism    Obesity (BMI 30-39.9)    Old inferior wall myocardial infarction    Sleep apnea    USES CPAP  OCCASIONALLY   Unstable angina (HCC) 09/13/2013    Family History  Problem Relation Age of Onset   Emphysema Mother    Alcoholism Father    Stroke Father    Colon cancer Maternal Grandmother    Colon polyps Maternal Grandmother    Healthy Son    Healthy Son    Healthy Son    Esophageal cancer Neg Hx    Rectal cancer Neg Hx    Stomach cancer Neg Hx    Past Surgical History:  Procedure Laterality Date   CHOLECYSTECTOMY     COLONOSCOPY  12/25/2014   MANY previously   CORONARY ANGIOPLASTY  1995   CORONARY ANGIOPLASTY WITH STENT PLACEMENT  1996   CORONARY ARTERY BYPASS  GRAFT N/A 09/17/2013   Procedure: CORONARY ARTERY BYPASS GRAFTING (CABG);  Surgeon: Loreli Slot, MD;  Location: [facility] OR;  Service: Open Heart Surgery;  Laterality: N/A;   CYSTOSCOPY WITH RETROGRADE PYELOGRAM, URETEROSCOPY AND STENT PLACEMENT Right 01/11/2014   Procedure: CYSTOSCOPY WITH RETROGRADE PYELOGRAM, URETEROSCOPY AND STENT PLACEMENT;  Surgeon: Milford Cage, MD;  Location: [facility] ORS;  Service: Urology;  Laterality: Right;   CYSTOSCOPY WITH RETROGRADE PYELOGRAM, URETEROSCOPY AND STENT PLACEMENT Right 02/01/2014   Procedure: CYSTOSCOPY WITH RETROGRADE PYELOGRAM, URETEROSCOPY AND STENT EXCHANGE;  Surgeon: Milford Cage, MD;  Location: [facility] OR[facility];  Service: Urology;  Laterality: Right;   EXPLORATION POST OPERATIVE OPEN HEART N/A 09/17/2013   Procedure: EXPLORATION POST OPERATIVE OPEN HEART;  Surgeon: Loreli Slot, MD;  Location: [facility] OR;  Service: Open Heart Surgery;  Laterality: N/A;   HOLMIUM LASER APPLICATION Right 02/01/2014   Procedure: HOLMIUM LASER APPLICATION;  Surgeon: Milford Cage, MD;  Location: [facility]S;  Service: Urology;  Laterality: Right;   IR RADIOLOGIST EVAL & MGMT  01/12/2022   LEFT HEART CATHETERIZATION WITH CORONARY ANGIOGRAM N/A 09/14/2013   Procedure: LEFT HEART CATHETERIZATION WITH CORONARY ANGIOGRAM;  Surgeon: Othella Boyer, MD;  Location: [facility] CATH LAB;  Service: Cardiovascular;  Laterality: N/A;   POLYPECTOMY  2022   Social History   Social History Narrative   Drinks about 1-2 caffeine beverages a day    Immunization History  Administered Date(s) Administered   PFIZER(Purple Top)SARS-COV-2 Vaccination 11/24/2019, 12/15/2019   Tdap 12/20/2015     Objective: Vital Signs: BP 128/86 (BP Location: Left Arm, Patient Position: Sitting, Cuff Size: Large)   Pulse 72   Resp 15   Ht 5\' 9"  (1.753 m)   Wt 253 lb 12.8 oz (115.1 kg)   BMI 37.48 kg/m    Physical Exam Vitals and nursing note reviewed.  Constitutional:      Appearance:  He is well-developed.  HENT:     Head: Normocephalic and atraumatic.  Eyes:     Conjunctiva/sclera: Conjunctivae normal.     Pupils: Pupils are equal, round, and reactive to light.  Cardiovascular:     Rate and Rhythm: Normal rate and regular rhythm.     Heart sounds: Normal heart sounds.  Pulmonary:     Effort: Pulmonary effort is normal.     Breath sounds: Normal breath sounds.  Abdominal:     General: Bowel sounds are normal.     Palpations: Abdomen is soft.  Musculoskeletal:     Cervical back: Normal range of motion and neck supple.  Skin:    General: Skin is warm and dry.     Capillary Refill: Capillary refill takes less than 2 seconds.  Neurological:     Mental Status: He is alert  and oriented to person, place, and time.  Psychiatric:        Behavior: Behavior normal.      Musculoskeletal Exam: He had good range of motion of the cervical spine.  He had limited range of motion of the lumbar spine.  Shoulders, elbows, wrists, MCPs PIPs and DIPs were in good range of motion with no synovitis.  He had PIP and DIP thickening bilaterally.  Hip joints and knee joints in good range of motion with discomfort on range of motion of his bilateral knee joints.  No warmth swelling or effusion was noted.  There was no tenderness over ankles or MTPs.  CDAI Exam: CDAI Score: -- Patient Global: --; Provider Global: -- Swollen: --; Tender: -- Joint Exam 02/18/2023   No joint exam has been documented for this visit   There is currently no information documented on the homunculus. Go to the Rheumatology activity and complete the homunculus joint exam.  Investigation: No additional findings.  Imaging: No results found.  Recent Labs: Lab Results  Component Value Date   WBC 11.0 (H) 01/04/2022   HGB 15.0 01/04/2022   PLT 269 01/04/2022   NA 143 05/26/2022   K 5.1 05/26/2022   CL 104 05/26/2022   CO2 25 05/26/2022   GLUCOSE 138 (H) 05/26/2022   BUN 26 05/26/2022   CREATININE  1.91 (H) 05/26/2022   BILITOT 0.6 12/30/2021   ALKPHOS 43 12/30/2021   AST 28 12/30/2021   ALT 23 12/30/2021   PROT 5.6 (L) 12/30/2021   ALBUMIN 2.3 (L) 12/30/2021   CALCIUM 10.6 (H) 05/26/2022   GFRAA 29 (L) 01/25/2014   January 28, 2023 ANA 1: 160NS, ENA (RNP, Smith, SSA, SSB, dsDNA negative), C3-C4 normal, anticardiolipin negative, beta-2 GP 1 negative, uric acid 5.5  05/20/22:ANA 1:160 speckled, C3 155, C4 32, ANCA-   Speciality Comments: No specialty comments available.  Procedures:  No procedures performed Allergies: Bupropion, Morphine and codeine, Ace inhibitors, and Lisinopril   Assessment / Plan:     Visit Diagnoses: Positive ANA (antinuclear antibody) - ANA 1: 160NS, ENA negative, complements normal, history of fatigue and photosensitivity.  I had a detailed discussion with the patient regarding the labs.  At this time he does not meet the criteria for autoimmune disease.  I advised him to contact me if he develops any new symptoms.  He has mild sicca symptoms which he relates to medication use.  I will reevaluate him in 1 year.  Idiopathic chronic gout of multiple sites without tophus - He remains on allopurinol.  His uric acid was 5.5 which was in the desirable range.  He has not had any gout flare in a long time.  Primary osteoarthritis of both knees - Severe end-stage osteoarthritis of bilateral knee joints.  Followed by Dr. Dion Saucier and was advised total knee replacement.  Patient awaits total knee replacement after the clearance.  Chronic kidney disease (CKD), stage IV (severe) (HCC) - FSGS diagnosed by Dr. Malen Gauze.  Secondary hyperparathyroidism (HCC) - Due to renal disease.  History of hyperlipidemia  S/P CABG x 4  Essential hypertension-blood pressure was normal today at 128/86.  Old inferior wall myocardial infarction  History of gastroesophageal reflux (GERD)  History of melanoma  History of hypothyroidism  MCI (mild cognitive impairment) with memory  loss  History of nephrolithiasis  Excessive daytime sleepiness  OSA on CPAP  Orders: No orders of the defined types were placed in this encounter.  No orders of the defined types were  placed in this encounter.    Follow-Up Instructions: Return in about 1 year (around 02/18/2024) for Osteoarthritis, Gout, +ANA.   Pollyann Savoy, MD  Note - This record has been created using Animal nutritionist.  Chart creation errors have been sought, but may not always  have been located. Such creation errors do not reflect on  the standard of medical care.

## 2023-02-08 ENCOUNTER — Ambulatory Visit: Payer: PPO | Admitting: Cardiology

## 2023-02-10 DIAGNOSIS — G4733 Obstructive sleep apnea (adult) (pediatric): Secondary | ICD-10-CM | POA: Diagnosis not present

## 2023-02-15 ENCOUNTER — Ambulatory Visit: Payer: PPO | Admitting: Cardiology

## 2023-02-15 ENCOUNTER — Encounter: Payer: Self-pay | Admitting: Cardiology

## 2023-02-15 VITALS — BP 128/82 | HR 89 | Ht 69.0 in | Wt 253.0 lb

## 2023-02-15 DIAGNOSIS — I2510 Atherosclerotic heart disease of native coronary artery without angina pectoris: Secondary | ICD-10-CM

## 2023-02-15 DIAGNOSIS — G4733 Obstructive sleep apnea (adult) (pediatric): Secondary | ICD-10-CM

## 2023-02-15 DIAGNOSIS — F5101 Primary insomnia: Secondary | ICD-10-CM | POA: Diagnosis not present

## 2023-02-15 DIAGNOSIS — I10 Essential (primary) hypertension: Secondary | ICD-10-CM

## 2023-02-15 MED ORDER — TRAZODONE HCL 100 MG PO TABS
100.0000 mg | ORAL_TABLET | Freq: Every day | ORAL | 1 refills | Status: DC
Start: 2023-02-15 — End: 2023-08-20

## 2023-02-15 NOTE — Progress Notes (Signed)
Primary Physician/Referring:  Cleatis Polka., MD  Patient ID: Ena Dawley, male    DOB: 1945/10/08, 77 y.o.   MRN: [id number]  Chief Complaint  Patient presents with   Coronary Artery Disease   Follow-up    HPI:    LEVINE JAROSH  is a 77 y.o. Caucasian male with coronary artery disease S/P CABG 2014, obesity, hypertension, hyperlipidemia, stage IV chronic kidney disease, history of inferior MI in 1995 SP balloon angioplasty to RCA, history of CABG x4 in 2014 or 2015, mild sleep apnea has not been compliant with CPAP, follows Dr. Vickey Huger.  He presents here for 76-month follow-up of hypertension, coronary artery disease and medication management.  His main complaint today is more fatigued.  Denies chest pain, exertional dyspnea stable, no PND orthopnea or leg edema.  Past Medical History:  Diagnosis Date   Adenomatous colon polyp    Allergy    seasonal   Arthritis    Chronic kidney disease, stage III (GFR 30-59 ml/min)    Depression    past in early 90s   GERD 02/24/2008   Gout    History of kidney stones    Hypothyroidism    Obesity (BMI 30-39.9)    Old inferior wall myocardial infarction    Sleep apnea    USES CPAP OCCASIONALLY   Unstable angina (HCC) 09/13/2013   Social History   Tobacco Use   Smoking status: Former    Packs/day: 1.00    Years: 4.00    Additional pack years: 0.00    Total pack years: 4.00    Types: Cigarettes    Quit date: 1970    Years since quitting: 54.4    Passive exposure: Past   Smokeless tobacco: Never   Tobacco comments:    Quit 1971  Substance Use Topics   Alcohol use: No   Marital Status: Married  ROS  Review of Systems  Constitutional: Positive for malaise/fatigue.  Cardiovascular:  Positive for dyspnea on exertion. Negative for chest pain and leg swelling.  Respiratory:  Positive for snoring (non-compliant with CPAP).    Objective  Blood pressure 128/82, pulse 89, height 5\' 9"  (1.753 m), weight 253 lb  (114.8 kg), SpO2 98 %.     02/15/2023    3:16 PM 01/28/2023    9:18 AM 01/28/2023    9:01 AM  Vitals with BMI  Height 5\' 9"   5\' 9"   Weight 253 lbs  255 lbs 3 oz  BMI 37.34  37.67  Systolic 128 131 725  Diastolic 82 86 79  Pulse 89 69 71     Physical Exam Constitutional:      Appearance: He is obese.  Neck:     Vascular: No carotid bruit or JVD.  Cardiovascular:     Rate and Rhythm: Normal rate and regular rhythm.     Pulses: Intact distal pulses.          Radial pulses are 2+ on the right side and 2+ on the left side.       Femoral pulses are 2+ on the right side and 2+ on the left side.      Popliteal pulses are 1+ on the right side and 1+ on the left side.       Dorsalis pedis pulses are 2+ on the right side and 2+ on the left side.       Posterior tibial pulses are 1+ on the right side and 1+ on the left side.  Heart sounds: Normal heart sounds. No murmur heard.    No gallop.  Pulmonary:     Effort: Pulmonary effort is normal.     Breath sounds: Normal breath sounds.  Abdominal:     General: Bowel sounds are normal.     Palpations: Abdomen is soft.  Musculoskeletal:     Right lower leg: No edema.     Left lower leg: No edema.  Skin:    General: Skin is warm and dry.     Capillary Refill: Capillary refill takes less than 2 seconds.     Comments: Bilateral fingers and toes pale with discoloration at nail beds. Bilateral hands warm and dry. Bilateral feet cool to touch.    Laboratory examination:   Recent Labs    05/26/22 1442  NA 143  K 5.1  CL 104  CO2 25  GLUCOSE 138*  BUN 26  CREATININE 1.91*  CALCIUM 10.6*      Latest Ref Rng & Units 12/30/2021    8:07 PM 12/30/2021    2:15 PM 10/24/2021    3:33 AM  Hepatic Function  Total Protein 6.5 - 8.1 g/dL 5.6  7.4  7.4   Albumin 3.5 - 5.0 g/dL 2.3  3.1  3.5   AST 15 - 41 U/L 28  33  147   ALT 0 - 44 U/L 23  28  279   Alk Phosphatase 38 - 126 U/L 43  52  145   Total Bilirubin 0.3 - 1.2 mg/dL 0.6  0.8  0.9         Latest Ref Rng & Units 01/04/2022   11:55 AM 12/30/2021    8:07 PM 12/30/2021    1:38 PM  CBC  WBC 4.0 - 10.5 K/uL 11.0  8.3    Hemoglobin 13.0 - 17.0 g/dL 40.9  81.1  91.4   Hematocrit 39.0 - 52.0 % 45.6  40.1  49.0   Platelets 150 - 400 K/uL 269  188      External labs:  Labs 04/15/2022:  A1c 6.0%.  TSH 3.26, normal.  Free T4 normal.  Total cholesterol 147, triglycerides 156, HDL 55, LDL 61.  Non-HDL cholesterol 92.  Serum glucose 1 4 mg, BUN 24, serum creatinine 1.7, EGFR 39 mL, potassium 4.5, LFTs normal.  Uric acid 6.2, normal.  Hb 17.3/HCT 50.7, platelets 206, normal indicis.  Labs 01/26/2022:  Hb 15.1/HCT 45.4, platelets 190.  Normal indicis.  BUN 13, creatinine 1.9, EGFR 34 mL.  Potassium 3.8.  LFTs normal.  Medications and allergies   Allergies  Allergen Reactions   Bupropion     Other reaction(s): hallucinations   Morphine And Related Other (See Comments)    STATES HORRIBLE HALLUCINATIONS.   Ace Inhibitors Cough   Lisinopril Other (See Comments) and Cough     Current Outpatient Medications:    Acetaminophen (TYLENOL EXTRA STRENGTH PO), Take by mouth as needed., Disp: , Rfl:    allopurinol (ZYLOPRIM) 300 MG tablet, Take 300 mg by mouth at bedtime., Disp: , Rfl:    ALPRAZolam (XANAX) 0.5 MG tablet, Take 1 tablet by mouth as needed., Disp: , Rfl:    aspirin EC 325 MG tablet, Take 325 mg by mouth at bedtime., Disp: , Rfl:    cetirizine (ZYRTEC) 10 MG tablet, Take 10 mg by mouth daily., Disp: , Rfl:    Cholecalciferol (VITAMIN D-3) 25 MCG (1000 UT) CAPS, Take 1 capsule by mouth daily at 2 PM., Disp: , Rfl:    Coenzyme  Q10 (COQ-10 PO), Take 100 mg by mouth daily., Disp: , Rfl:    Dextromethorphan-guaiFENesin 10-200 MG/15ML LIQD, Take by mouth as needed., Disp: , Rfl:    escitalopram (LEXAPRO) 10 MG tablet, Take by mouth., Disp: , Rfl:    ezetimibe (ZETIA) 10 MG tablet, Take 10 mg by mouth daily., Disp: , Rfl:    famotidine (PEPCID) 20 MG tablet, Take 20 mg  by mouth 2 (two) times daily., Disp: , Rfl:    FLUTICASONE PROPIONATE, NASAL, NA, Place 2 Inhalers into the nose as needed., Disp: , Rfl:    Levothyroxine Sodium 100 MCG CAPS, Take 100 mcg by mouth daily before breakfast., Disp: , Rfl:    loratadine (CLARITIN) 10 MG tablet, Take 10 mg by mouth daily., Disp: , Rfl:    losartan (COZAAR) 25 MG tablet, Take 1 tablet (25 mg total) by mouth every evening., Disp: 90 tablet, Rfl: 3   metoprolol tartrate (LOPRESSOR) 25 MG tablet, Take 1 tablet (25 mg total) by mouth 2 (two) times daily., Disp: 180 tablet, Rfl: 3   oxyCODONE-acetaminophen (PERCOCET/ROXICET) 5-325 MG tablet, Take 1 tablet by mouth as needed., Disp: , Rfl:    Probiotic Product (PROBIOTIC-10 PO), Take by mouth., Disp: , Rfl:    rosuvastatin (CRESTOR) 20 MG tablet, Take 20 mg by mouth daily., Disp: , Rfl:    tamsulosin (FLOMAX) 0.4 MG CAPS capsule, Take 0.4 mg by mouth daily after supper., Disp: , Rfl:    Ubiquinol 100 MG CAPS, Take 1 capsule by mouth daily at 2 PM., Disp: , Rfl:    traZODone (DESYREL) 100 MG tablet, Take 1 tablet (100 mg total) by mouth at bedtime., Disp: 90 tablet, Rfl: 1    Radiology:    Cardiac Studies:   Coronary angiogram 09/14/2013: Proximal LAD 80%, OM1 95% stenosis, proximal RCA subtotal stenosis.  CABG x 4 09/17/2013: Utilizing LIMA to LAD, SVG to OM2, and a sequential SVG to PDA and distal RCA.  Lexiscan (Walking with mod Bruce)Tetrofosmin Stress Test  02/06/2020: Nondiagnostic ECG stress. Mild degree medium extent perfusion defect consistent with mild (reversible) ischemia located in the mid inferolateral wall and basal inferolateral wall  (Left Circumflex Artery region) of left ventricle. Overall LV systolic function is normal without regional wall motion abnormalities. Stress LV EF: 63%.  No previous exam available for comparison. Low risk.   Echocardiogram 03/31/2021: Technically difficult study with suboptimal images. Left ventricle cavity is normal  in size and wall thickness. Normal global wall motion. Normal LV systolic function with EF 61%. Doppler evidence of grade I (impaired) diastolic dysfunction, normal LAP.  No significant valvular abnormality. IVC not seen.  EKG  EKG 02/15/2023: Sinus rhythm with first-degree AV block at the rate of 80 bpm, normal axis, poor R progression, probably normal variant.  No evidence of ischemia.  Compared to 08/10/2022, no significant change.  Assessment     ICD-10-CM   1. Coronary artery disease involving native coronary artery of native heart without angina pectoris  I25.10 EKG 12-Lead    2. Primary hypertension  I10     3. OSA on CPAP  G47.33 traZODone (DESYREL) 100 MG tablet    4. Primary insomnia  F51.01 traZODone (DESYREL) 100 MG tablet      Meds ordered this encounter  Medications   traZODone (DESYREL) 100 MG tablet    Sig: Take 1 tablet (100 mg total) by mouth at bedtime.    Dispense:  90 tablet    Refill:  1    Medications  Discontinued During This Encounter  Medication Reason   traZODone (DESYREL) 50 MG tablet Reorder    Recommendations:   AADI RICHARDSON  is a 77 y.o. Caucasian male with coronary artery disease S/P CABG 2014, obesity, hypertension, hyperlipidemia, stage IV chronic kidney disease, history of inferior MI in 1995 SP balloon angioplasty to RCA, history of CABG x4 in 2014 or 2015, mild sleep apnea has not been compliant with CPAP, follows Dr. Vickey Huger.  1. Coronary artery disease involving native coronary artery of native heart without angina pectoris Patient presents for a 94-month office visit, states that he is planning on having knee arthroplasty soon.  Remains asymptomatic without chest pain or dyspnea however his activity is markedly limited due to arthritis.  He is also gained about 12 pounds in weight since last office visit 6 months ago.  - EKG 12-Lead  2. Primary hypertension Blood pressure under excellent control with metoprolol and  losartan.  Stage IV chronic kidney disease, tolerating losartan at 25 mg daily.  3. OSA on CPAP Patient's recent fatigue is related to patient not using CPAP, taking frequent naps during the day.  Discussed with the patient the importance of adhering to good sleep pattern and also importance of use of CPAP.  Patient is presently on trazodone 50 mg daily we will increase it to 100 mg daily to see whether he will sleep through the night and advised him not to take or avoid taking naps during the daytime. - traZODone (DESYREL) 100 MG tablet; Take 1 tablet (100 mg total) by mouth at bedtime.  Dispense: 90 tablet; Refill: 1  4. Primary insomnia  - traZODone (DESYREL) 100 MG tablet; Take 1 tablet (100 mg total) by mouth at bedtime.  Dispense: 90 tablet; Refill: 1  Physical examination remains unchanged, EKG except for first-degree block but is nonischemic and overall no change from his examination 6 months ago.  Renal function has remained stable and lipids are well-controlled.  I will see him back on annual basis.    Yates Decamp, AGNP-C 02/15/2023, 4:13 PM Office: [phone] Pager: [phone].

## 2023-02-18 ENCOUNTER — Ambulatory Visit: Payer: PPO | Attending: Rheumatology | Admitting: Rheumatology

## 2023-02-18 ENCOUNTER — Encounter: Payer: Self-pay | Admitting: Rheumatology

## 2023-02-18 VITALS — BP 128/86 | HR 72 | Resp 15 | Ht 69.0 in | Wt 253.8 lb

## 2023-02-18 DIAGNOSIS — N2581 Secondary hyperparathyroidism of renal origin: Secondary | ICD-10-CM | POA: Diagnosis not present

## 2023-02-18 DIAGNOSIS — G3184 Mild cognitive impairment, so stated: Secondary | ICD-10-CM | POA: Diagnosis not present

## 2023-02-18 DIAGNOSIS — Z8639 Personal history of other endocrine, nutritional and metabolic disease: Secondary | ICD-10-CM | POA: Diagnosis not present

## 2023-02-18 DIAGNOSIS — N184 Chronic kidney disease, stage 4 (severe): Secondary | ICD-10-CM

## 2023-02-18 DIAGNOSIS — Z8719 Personal history of other diseases of the digestive system: Secondary | ICD-10-CM | POA: Diagnosis not present

## 2023-02-18 DIAGNOSIS — I252 Old myocardial infarction: Secondary | ICD-10-CM

## 2023-02-18 DIAGNOSIS — Z85820 Personal history of malignant melanoma of skin: Secondary | ICD-10-CM | POA: Diagnosis not present

## 2023-02-18 DIAGNOSIS — M1A09X0 Idiopathic chronic gout, multiple sites, without tophus (tophi): Secondary | ICD-10-CM | POA: Diagnosis not present

## 2023-02-18 DIAGNOSIS — I10 Essential (primary) hypertension: Secondary | ICD-10-CM | POA: Diagnosis not present

## 2023-02-18 DIAGNOSIS — R7689 Other specified abnormal immunological findings in serum: Secondary | ICD-10-CM

## 2023-02-18 DIAGNOSIS — M170 Bilateral primary osteoarthritis of knee: Secondary | ICD-10-CM | POA: Diagnosis not present

## 2023-02-18 DIAGNOSIS — R768 Other specified abnormal immunological findings in serum: Secondary | ICD-10-CM | POA: Diagnosis not present

## 2023-02-18 DIAGNOSIS — Z87442 Personal history of urinary calculi: Secondary | ICD-10-CM

## 2023-02-18 DIAGNOSIS — G4733 Obstructive sleep apnea (adult) (pediatric): Secondary | ICD-10-CM

## 2023-02-18 DIAGNOSIS — Z951 Presence of aortocoronary bypass graft: Secondary | ICD-10-CM | POA: Diagnosis not present

## 2023-02-18 DIAGNOSIS — G4719 Other hypersomnia: Secondary | ICD-10-CM

## 2023-03-02 ENCOUNTER — Encounter (HOSPITAL_COMMUNITY): Payer: Self-pay

## 2023-03-07 DIAGNOSIS — M545 Low back pain, unspecified: Secondary | ICD-10-CM | POA: Diagnosis not present

## 2023-03-11 NOTE — Progress Notes (Signed)
Surgery orders requested via Epic inbox. °

## 2023-03-18 ENCOUNTER — Encounter (HOSPITAL_COMMUNITY)
Admission: RE | Admit: 2023-03-18 | Discharge: 2023-03-18 | Disposition: A | Discharge status: Home / Self Care | Payer: PPO | Source: Ambulatory Visit | Attending: Internal Medicine | Admitting: Internal Medicine

## 2023-03-21 DIAGNOSIS — D692 Other nonthrombocytopenic purpura: Secondary | ICD-10-CM | POA: Diagnosis not present

## 2023-03-21 DIAGNOSIS — L820 Inflamed seborrheic keratosis: Secondary | ICD-10-CM | POA: Diagnosis not present

## 2023-03-21 DIAGNOSIS — D225 Melanocytic nevi of trunk: Secondary | ICD-10-CM | POA: Diagnosis not present

## 2023-03-21 DIAGNOSIS — Z85820 Personal history of malignant melanoma of skin: Secondary | ICD-10-CM | POA: Diagnosis not present

## 2023-03-21 DIAGNOSIS — L821 Other seborrheic keratosis: Secondary | ICD-10-CM | POA: Diagnosis not present

## 2023-03-21 DIAGNOSIS — D2271 Melanocytic nevi of right lower limb, including hip: Secondary | ICD-10-CM | POA: Diagnosis not present

## 2023-03-21 DIAGNOSIS — Z85828 Personal history of other malignant neoplasm of skin: Secondary | ICD-10-CM | POA: Diagnosis not present

## 2023-03-21 DIAGNOSIS — L570 Actinic keratosis: Secondary | ICD-10-CM | POA: Diagnosis not present

## 2023-04-11 ENCOUNTER — Telehealth: Payer: Self-pay | Admitting: Neurology

## 2023-04-11 ENCOUNTER — Encounter: Payer: Self-pay | Admitting: Neurology

## 2023-04-11 NOTE — Telephone Encounter (Signed)
LVM and sent letter in mail informing pt of need to reschedule 05/16/23 appt - MD out 

## 2023-04-18 DIAGNOSIS — E785 Hyperlipidemia, unspecified: Secondary | ICD-10-CM | POA: Diagnosis not present

## 2023-04-18 DIAGNOSIS — G8929 Other chronic pain: Secondary | ICD-10-CM | POA: Diagnosis not present

## 2023-04-18 DIAGNOSIS — M25569 Pain in unspecified knee: Secondary | ICD-10-CM | POA: Diagnosis not present

## 2023-04-21 ENCOUNTER — Encounter (HOSPITAL_COMMUNITY): Admission: RE | Admit: 2023-04-21 | Payer: PPO | Source: Ambulatory Visit

## 2023-04-22 DIAGNOSIS — N1832 Chronic kidney disease, stage 3b: Secondary | ICD-10-CM | POA: Diagnosis not present

## 2023-04-27 DIAGNOSIS — I129 Hypertensive chronic kidney disease with stage 1 through stage 4 chronic kidney disease, or unspecified chronic kidney disease: Secondary | ICD-10-CM | POA: Diagnosis not present

## 2023-04-27 DIAGNOSIS — R768 Other specified abnormal immunological findings in serum: Secondary | ICD-10-CM | POA: Diagnosis not present

## 2023-04-27 DIAGNOSIS — N1832 Chronic kidney disease, stage 3b: Secondary | ICD-10-CM | POA: Diagnosis not present

## 2023-04-27 DIAGNOSIS — N200 Calculus of kidney: Secondary | ICD-10-CM | POA: Diagnosis not present

## 2023-04-27 DIAGNOSIS — N2581 Secondary hyperparathyroidism of renal origin: Secondary | ICD-10-CM | POA: Diagnosis not present

## 2023-04-27 DIAGNOSIS — N400 Enlarged prostate without lower urinary tract symptoms: Secondary | ICD-10-CM | POA: Diagnosis not present

## 2023-04-27 DIAGNOSIS — R809 Proteinuria, unspecified: Secondary | ICD-10-CM | POA: Diagnosis not present

## 2023-04-27 DIAGNOSIS — N2889 Other specified disorders of kidney and ureter: Secondary | ICD-10-CM | POA: Diagnosis not present

## 2023-05-03 ENCOUNTER — Ambulatory Visit: Admit: 2023-05-03 | Payer: PPO | Admitting: Orthopedic Surgery

## 2023-05-03 SURGERY — ARTHROPLASTY, KNEE, UNICOMPARTMENTAL
Anesthesia: Spinal | Site: Knee | Laterality: Left

## 2023-05-05 DIAGNOSIS — G4733 Obstructive sleep apnea (adult) (pediatric): Secondary | ICD-10-CM | POA: Diagnosis not present

## 2023-05-11 DIAGNOSIS — I129 Hypertensive chronic kidney disease with stage 1 through stage 4 chronic kidney disease, or unspecified chronic kidney disease: Secondary | ICD-10-CM | POA: Diagnosis not present

## 2023-05-11 DIAGNOSIS — E039 Hypothyroidism, unspecified: Secondary | ICD-10-CM | POA: Diagnosis not present

## 2023-05-11 DIAGNOSIS — M109 Gout, unspecified: Secondary | ICD-10-CM | POA: Diagnosis not present

## 2023-05-11 DIAGNOSIS — E785 Hyperlipidemia, unspecified: Secondary | ICD-10-CM | POA: Diagnosis not present

## 2023-05-11 DIAGNOSIS — N401 Enlarged prostate with lower urinary tract symptoms: Secondary | ICD-10-CM | POA: Diagnosis not present

## 2023-05-11 DIAGNOSIS — E1122 Type 2 diabetes mellitus with diabetic chronic kidney disease: Secondary | ICD-10-CM | POA: Diagnosis not present

## 2023-05-11 DIAGNOSIS — E291 Testicular hypofunction: Secondary | ICD-10-CM | POA: Diagnosis not present

## 2023-05-11 DIAGNOSIS — N184 Chronic kidney disease, stage 4 (severe): Secondary | ICD-10-CM | POA: Diagnosis not present

## 2023-05-12 DIAGNOSIS — N1832 Chronic kidney disease, stage 3b: Secondary | ICD-10-CM | POA: Diagnosis not present

## 2023-05-16 ENCOUNTER — Ambulatory Visit: Payer: PPO | Admitting: Neurology

## 2023-05-18 DIAGNOSIS — E1122 Type 2 diabetes mellitus with diabetic chronic kidney disease: Secondary | ICD-10-CM | POA: Diagnosis not present

## 2023-05-18 DIAGNOSIS — E039 Hypothyroidism, unspecified: Secondary | ICD-10-CM | POA: Diagnosis not present

## 2023-05-18 DIAGNOSIS — Z0000 Encounter for general adult medical examination without abnormal findings: Secondary | ICD-10-CM | POA: Diagnosis not present

## 2023-05-18 DIAGNOSIS — R82998 Other abnormal findings in urine: Secondary | ICD-10-CM | POA: Diagnosis not present

## 2023-05-18 DIAGNOSIS — F339 Major depressive disorder, recurrent, unspecified: Secondary | ICD-10-CM | POA: Diagnosis not present

## 2023-05-18 DIAGNOSIS — Z1339 Encounter for screening examination for other mental health and behavioral disorders: Secondary | ICD-10-CM | POA: Diagnosis not present

## 2023-05-18 DIAGNOSIS — R5383 Other fatigue: Secondary | ICD-10-CM | POA: Diagnosis not present

## 2023-05-18 DIAGNOSIS — Z1331 Encounter for screening for depression: Secondary | ICD-10-CM | POA: Diagnosis not present

## 2023-05-18 DIAGNOSIS — I25810 Atherosclerosis of coronary artery bypass graft(s) without angina pectoris: Secondary | ICD-10-CM | POA: Diagnosis not present

## 2023-05-18 DIAGNOSIS — E785 Hyperlipidemia, unspecified: Secondary | ICD-10-CM | POA: Diagnosis not present

## 2023-05-18 DIAGNOSIS — Z23 Encounter for immunization: Secondary | ICD-10-CM | POA: Diagnosis not present

## 2023-05-18 DIAGNOSIS — N184 Chronic kidney disease, stage 4 (severe): Secondary | ICD-10-CM | POA: Diagnosis not present

## 2023-05-18 DIAGNOSIS — R531 Weakness: Secondary | ICD-10-CM | POA: Diagnosis not present

## 2023-05-18 DIAGNOSIS — F17201 Nicotine dependence, unspecified, in remission: Secondary | ICD-10-CM | POA: Diagnosis not present

## 2023-05-18 DIAGNOSIS — I129 Hypertensive chronic kidney disease with stage 1 through stage 4 chronic kidney disease, or unspecified chronic kidney disease: Secondary | ICD-10-CM | POA: Diagnosis not present

## 2023-05-18 DIAGNOSIS — I700 Atherosclerosis of aorta: Secondary | ICD-10-CM | POA: Diagnosis not present

## 2023-05-23 DIAGNOSIS — M6281 Muscle weakness (generalized): Secondary | ICD-10-CM | POA: Diagnosis not present

## 2023-05-25 ENCOUNTER — Telehealth: Payer: Self-pay | Admitting: Internal Medicine

## 2023-05-25 NOTE — Telephone Encounter (Signed)
Pt scheduled to see Willette Cluster NP 06/01/23 at 11:30am. Left message for pt to call back.

## 2023-05-25 NOTE — Telephone Encounter (Signed)
Patient wife called stated he has been experiencing explosive diarrhea for about 3 weeks now and is seeking advise.

## 2023-05-26 NOTE — Telephone Encounter (Signed)
Spoke with pts wife and she is aware of appt. °

## 2023-05-27 DIAGNOSIS — M6281 Muscle weakness (generalized): Secondary | ICD-10-CM | POA: Diagnosis not present

## 2023-05-30 DIAGNOSIS — M6281 Muscle weakness (generalized): Secondary | ICD-10-CM | POA: Diagnosis not present

## 2023-06-01 ENCOUNTER — Encounter: Payer: Self-pay | Admitting: Nurse Practitioner

## 2023-06-01 ENCOUNTER — Other Ambulatory Visit: Payer: PPO

## 2023-06-01 ENCOUNTER — Ambulatory Visit: Payer: PPO | Admitting: Nurse Practitioner

## 2023-06-01 VITALS — BP 100/70 | HR 66 | Ht 69.0 in | Wt 248.0 lb

## 2023-06-01 DIAGNOSIS — M6281 Muscle weakness (generalized): Secondary | ICD-10-CM | POA: Diagnosis not present

## 2023-06-01 DIAGNOSIS — R197 Diarrhea, unspecified: Secondary | ICD-10-CM

## 2023-06-01 NOTE — Patient Instructions (Signed)
_______________________________________________________  If your blood pressure at your visit was 140/90 or greater, please contact your primary care physician to follow up on this. _______________________________________________________  If you are age 76 or older, your body mass index should be between 23-30. Your Body mass index is 36.62 kg/m. If this is out of the aforementioned range listed, please consider follow up with your Primary Care Provider. ________________________________________________________  The [facility] providers would like to encourage you to use [facility] to communicate with providers for non-urgent requests or questions.  Due to long hold times on the telephone, sending your provider a message by [facility] may be a faster and more efficient way to get a response.  Please allow 48 business hours for a response.  Please remember that this is for non-urgent requests.  _______________________________________________________  DISCONTINUE: Imodium for now  Your provider has requested that you go to the basement level for lab work before leaving today. Press "B" on the elevator. The lab is located at the first door on the left as you exit the elevator.  Due to recent changes in healthcare laws, you may see the results of your imaging and laboratory studies on MyChart before your provider has had a chance to review them.  We understand that in some cases there may be results that are confusing or concerning to you. Not all laboratory results come back in the same time frame and the provider may be waiting for multiple results in order to interpret others.  Please give [place] 48 hours in order for your provider to thoroughly review all the results before contacting the office for clarification of your results.   Please call our office you have recurrent explosive diarrhea.  Thank you for entrusting me with your care and choosing [facility].  Willette Cluster, NP

## 2023-06-01 NOTE — Progress Notes (Signed)
Noted  

## 2023-06-01 NOTE — Progress Notes (Signed)
ASSESSMENT & PLAN   77 y.o. yo male known to Dr. Marina Goodell. He has a history of colon polyps.  Last seen at time of colonoscopy in April 2021.   Explosive diarrhea. Started a month ago which he correlates timewise with starting (and then increasing dose) of hyaluronic acid for joint pain.  Diarrhea has resolved after reduction of hyaluronic acid dose and may have in fact been secondary to this supplement but infectious etiology also a consideration.  -- Recently started [place] but diarrhea is not a common side effect of this medication.  -- Now having some constipation after taking imodium last. Advised not to take anymore imodium.  -- Do not increase dose of hyaluronic acid while trying to sort through bowel changes -- He will contact our office should diarrhea recur and can get stool studies at that time  Chronic loose stool (since cholecystectomy in 2022), probably bile acid related.  However he also describes stools as being oily.  -Check fecal elastase -Follow up with me in a couple of months following resolution of acute issues. Consider trial of cholestyramine at that time if he desires  History of colon polyps.  A small sessile serrated polyp was removed at time of last colonoscopy April 2021, 5-year interval colonoscopy recommended  History of  CKD4, hypothyroidism, CAD status post CABG x 4, OSA on CPAP, melanoma   see PMH for any additional medical history   HPI   Chief complaint : Explosive diarrhea  Dalton Mitchell is here with his wife for evaluation of diarrhea.  He has had loose stools since cholecystectomy in 2022.  Loose stools generally postprandial , no more than 3 times a day if that many.  About a month ago he developed explosive, nonbloody diarrhea.  No associated nausea, vomiting or fevers . He feels like the diarrhea may be related to starting hyaluronic acid.  He started the hyaluronic acid and then increased to the maximum dose.  He then stopped the hyaluronic acid and the  diarrhea improved however he was also taken Imodium. Now he is back to a much lower dose of hyaluronic acid and has not had a return of the diarrhea even without imodium in last several days.  In fact he had a couple of constipated stools a couple of days ago and no BM since. He also started [place] about a month or so ago.  No other medication changes.  No recent antibiotics.  He has intentionally lost about 8 pounds.  Previous GI Studies   **May not be a complete list of studies  April 2021 Surveillance colonoscopy - One 4 mm polyp in the ascending colon, removed with a cold snare. Resected and retrieved. - Internal hemorrhoids. - The examination was otherwise normal on direct and retroflexion views.  Past Medical History:  Diagnosis Date   Adenomatous colon polyp    Allergy    seasonal   Arthritis    Chronic kidney disease, stage III (GFR 30-59 ml/min)    Depression    past in early 90s   GERD 02/24/2008   Gout    History of kidney stones    Hypothyroidism    Obesity (BMI 30-39.9)    Old inferior wall myocardial infarction    Sleep apnea    USES CPAP OCCASIONALLY   Unstable angina (HCC) 09/13/2013   Past Surgical History:  Procedure Laterality Date   CHOLECYSTECTOMY     COLONOSCOPY  12/25/2014   [place]Y previously   CORONARY ANGIOPLASTY  1995  CORONARY ANGIOPLASTY WITH STENT PLACEMENT  1996   CORONARY ARTERY BYPASS GRAFT N/A 09/17/2013   Procedure: CORONARY ARTERY BYPASS GRAFTING (CABG);  Surgeon: Loreli Slot, MD;  Location: [facility] OR;  Service: Open Heart Surgery;  Laterality: N/A;   CYSTOSCOPY WITH RETROGRADE PYELOGRAM, URETEROSCOPY AND STENT PLACEMENT Right 01/11/2014   Procedure: CYSTOSCOPY WITH RETROGRADE PYELOGRAM, URETEROSCOPY AND STENT PLACEMENT;  Surgeon: Milford Cage, MD;  Location: [facility] ORS;  Service: Urology;  Laterality: Right;   CYSTOSCOPY WITH RETROGRADE PYELOGRAM, URETEROSCOPY AND STENT PLACEMENT Right 02/01/2014   Procedure: CYSTOSCOPY WITH  RETROGRADE PYELOGRAM, URETEROSCOPY AND STENT EXCHANGE;  Surgeon: Milford Cage, MD;  Location: [facility] ORS;  Service: Urology;  Laterality: Right;   EXPLORATION POST OPERATIVE OPEN HEART N/A 09/17/2013   Procedure: EXPLORATION POST OPERATIVE OPEN HEART;  Surgeon: Loreli Slot, MD;  Location:  [facility] OR;  Service: Open Heart Surgery;  Laterality: N/A;   HOLMIUM LASER APPLICATION Right 02/01/2014   Procedure: HOLMIUM LASER APPLICATION;  Surgeon: Milford Cage, MD;  Location: [facility] ORS;  Service: Urology;  Laterality: Right;   IR RADIOLOGIST EVAL & MGMT  01/12/2022   LEFT HEART CATHETERIZATION WITH CORONARY ANGIOGRAM N/A 09/14/2013   Procedure: LEFT HEART CATHETERIZATION WITH CORONARY ANGIOGRAM;  Surgeon: Othella Boyer, MD;  [facility];  Service: Cardiovascular;  Laterality: N/A;   POLYPECTOMY  2022   Family History  Problem Relation Age of Onset   Emphysema Mother    Alcoholism Father    Stroke Father    Colon cancer Maternal Grandmother    Colon polyps Maternal Grandmother    Healthy Son    Healthy Son    Healthy Son    Esophageal cancer Neg Hx    Rectal cancer Neg Hx    Stomach cancer Neg Hx    Social History   Tobacco Use   Smoking status: Former    Current packs/day: 0.00    Average packs/day: 1 pack/day for 4.0 years (4.0 ttl pk-yrs)    Types: Cigarettes    Start date: 1966    Quit date: 1970    Years since quitting: 54.6    Passive exposure: Past   Smokeless tobacco: Never   Tobacco comments:    Quit 1971  Vaping Use   Vaping status: Never Used  Substance Use Topics   Alcohol use: No   Drug use: Never   Current Outpatient Medications  Medication Sig Dispense Refill   Acetaminophen (TYLENOL EXTRA STRENGTH PO) Take by mouth as needed.     allopurinol (ZYLOPRIM) 300 MG tablet Take 300 mg by mouth at bedtime.     aspirin EC 325 MG tablet Take 325 mg by mouth at bedtime.     Cholecalciferol (VITAMIN D-3) 25 MCG (1000 UT) CAPS Take 1 capsule by  mouth daily at 2 PM.     Coenzyme Q10 (COQ-10 PO) Take 100 mg by mouth daily.     Dextromethorphan-guaiFENesin 10-200 MG/15ML LIQD Take by mouth as needed.     escitalopram (LEXAPRO) 10 MG tablet Take by mouth.     ezetimibe (ZETIA) 10 MG tablet Take 10 mg by mouth daily.     famotidine (PEPCID) 20 MG tablet Take 20 mg by mouth 2 (two) times daily.     FARXIGA 10 MG TABS tablet Take 10 mg by mouth daily.     FLUTICASONE PROPIONATE, NASAL, NA Place 2 Inhalers into the nose as needed.     Levothyroxine Sodium 100 MCG CAPS Take 100 mcg by  mouth daily before breakfast.     loratadine (CLARITIN) 10 MG tablet Take 10 mg by mouth daily.     losartan (COZAAR) 25 MG tablet Take 1 tablet (25 mg total) by mouth every evening. 90 tablet 3   metoprolol tartrate (LOPRESSOR) 25 MG tablet Take 1 tablet (25 mg total) by mouth 2 (two) times daily. 180 tablet 3   oxyCODONE-acetaminophen (PERCOCET/ROXICET) 5-325 MG tablet Take 1 tablet by mouth as needed.     Probiotic Product (PROBIOTIC-10 PO) Take by mouth.     rosuvastatin (CRESTOR) 20 MG tablet Take 20 mg by mouth daily.     tamsulosin (FLOMAX) 0.4 MG CAPS capsule Take 0.4 mg by mouth daily after supper.     traZODone (DESYREL) 100 MG tablet Take 1 tablet (100 mg total) by mouth at bedtime. 90 tablet 1   Ubiquinol 100 MG CAPS Take 1 capsule by mouth daily at 2 PM.     No current facility-administered medications for this visit.   Allergies  Allergen Reactions   Bupropion     Other reaction(s): hallucinations   Morphine And Codeine Other (See Comments)    STATES HORRIBLE HALLUCINATIONS.   Ace Inhibitors Cough   Lisinopril Other (See Comments) and Cough     Review of Systems:  All systems reviewed and negative except where noted in HPI.   Wt Readings from Last 3 Encounters:  06/01/23 248 lb (112.5 kg)  02/18/23 253 lb 12.8 oz (115.1 kg)  02/15/23 253 lb (114.8 kg)    Physical Exam:  Ht 5\' 9"  (1.753 m)   Wt 248 lb (112.5 kg)   BMI 36.62  kg/m  Constitutional:  Pleasant, generally well appearing male in no acute distress. Psychiatric:  Normal mood and affect. Behavior is normal. EENT: Pupils normal.  Conjunctivae are normal. No scleral icterus. Neck supple.  Cardiovascular: Normal rate, regular rhythm.  Pulmonary/chest: Effort normal and breath sounds normal. No wheezing, rales or rhonchi. Abdominal: Soft, nondistended, nontender. Bowel sounds active throughout. There are no masses palpable. No hepatomegaly. Neurological: Alert and oriented to person place and time.  Willette Cluster, NP  06/01/2023, 11:43 AM

## 2023-06-02 ENCOUNTER — Other Ambulatory Visit: Payer: PPO

## 2023-06-02 DIAGNOSIS — R197 Diarrhea, unspecified: Secondary | ICD-10-CM | POA: Diagnosis not present

## 2023-06-07 DIAGNOSIS — M6281 Muscle weakness (generalized): Secondary | ICD-10-CM | POA: Diagnosis not present

## 2023-06-11 LAB — PANCREATIC ELASTASE, FECAL: Pancreatic Elastase-1, Stool: 365 ug/g

## 2023-06-13 DIAGNOSIS — M6281 Muscle weakness (generalized): Secondary | ICD-10-CM | POA: Diagnosis not present

## 2023-06-15 DIAGNOSIS — M6281 Muscle weakness (generalized): Secondary | ICD-10-CM | POA: Diagnosis not present

## 2023-06-17 DIAGNOSIS — M6281 Muscle weakness (generalized): Secondary | ICD-10-CM | POA: Diagnosis not present

## 2023-06-20 DIAGNOSIS — M6281 Muscle weakness (generalized): Secondary | ICD-10-CM | POA: Diagnosis not present

## 2023-06-22 DIAGNOSIS — M6281 Muscle weakness (generalized): Secondary | ICD-10-CM | POA: Diagnosis not present

## 2023-06-24 DIAGNOSIS — M6281 Muscle weakness (generalized): Secondary | ICD-10-CM | POA: Diagnosis not present

## 2023-06-27 DIAGNOSIS — M6281 Muscle weakness (generalized): Secondary | ICD-10-CM | POA: Diagnosis not present

## 2023-06-29 DIAGNOSIS — M6281 Muscle weakness (generalized): Secondary | ICD-10-CM | POA: Diagnosis not present

## 2023-07-01 DIAGNOSIS — M6281 Muscle weakness (generalized): Secondary | ICD-10-CM | POA: Diagnosis not present

## 2023-07-04 DIAGNOSIS — M6281 Muscle weakness (generalized): Secondary | ICD-10-CM | POA: Diagnosis not present

## 2023-07-30 ENCOUNTER — Other Ambulatory Visit: Payer: Self-pay | Admitting: Cardiology

## 2023-07-30 DIAGNOSIS — N1832 Chronic kidney disease, stage 3b: Secondary | ICD-10-CM

## 2023-07-30 DIAGNOSIS — I10 Essential (primary) hypertension: Secondary | ICD-10-CM

## 2023-08-01 DIAGNOSIS — N1832 Chronic kidney disease, stage 3b: Secondary | ICD-10-CM | POA: Diagnosis not present

## 2023-08-03 DIAGNOSIS — G4733 Obstructive sleep apnea (adult) (pediatric): Secondary | ICD-10-CM | POA: Diagnosis not present

## 2023-08-09 DIAGNOSIS — N200 Calculus of kidney: Secondary | ICD-10-CM | POA: Diagnosis not present

## 2023-08-09 DIAGNOSIS — R768 Other specified abnormal immunological findings in serum: Secondary | ICD-10-CM | POA: Diagnosis not present

## 2023-08-09 DIAGNOSIS — I129 Hypertensive chronic kidney disease with stage 1 through stage 4 chronic kidney disease, or unspecified chronic kidney disease: Secondary | ICD-10-CM | POA: Diagnosis not present

## 2023-08-09 DIAGNOSIS — N1832 Chronic kidney disease, stage 3b: Secondary | ICD-10-CM | POA: Diagnosis not present

## 2023-08-09 DIAGNOSIS — N2889 Other specified disorders of kidney and ureter: Secondary | ICD-10-CM | POA: Diagnosis not present

## 2023-08-09 DIAGNOSIS — N2581 Secondary hyperparathyroidism of renal origin: Secondary | ICD-10-CM | POA: Diagnosis not present

## 2023-08-09 DIAGNOSIS — R809 Proteinuria, unspecified: Secondary | ICD-10-CM | POA: Diagnosis not present

## 2023-08-09 DIAGNOSIS — N400 Enlarged prostate without lower urinary tract symptoms: Secondary | ICD-10-CM | POA: Diagnosis not present

## 2023-08-16 DIAGNOSIS — E1122 Type 2 diabetes mellitus with diabetic chronic kidney disease: Secondary | ICD-10-CM | POA: Diagnosis not present

## 2023-08-16 DIAGNOSIS — N184 Chronic kidney disease, stage 4 (severe): Secondary | ICD-10-CM | POA: Diagnosis not present

## 2023-08-16 DIAGNOSIS — G4733 Obstructive sleep apnea (adult) (pediatric): Secondary | ICD-10-CM | POA: Diagnosis not present

## 2023-08-16 DIAGNOSIS — I129 Hypertensive chronic kidney disease with stage 1 through stage 4 chronic kidney disease, or unspecified chronic kidney disease: Secondary | ICD-10-CM | POA: Diagnosis not present

## 2023-08-16 DIAGNOSIS — I25810 Atherosclerosis of coronary artery bypass graft(s) without angina pectoris: Secondary | ICD-10-CM | POA: Diagnosis not present

## 2023-08-19 ENCOUNTER — Other Ambulatory Visit: Payer: Self-pay | Admitting: Cardiology

## 2023-08-19 DIAGNOSIS — G4733 Obstructive sleep apnea (adult) (pediatric): Secondary | ICD-10-CM

## 2023-08-19 DIAGNOSIS — F5101 Primary insomnia: Secondary | ICD-10-CM

## 2023-08-19 NOTE — Telephone Encounter (Signed)
Pt's pharmacy is requesting a refill on trazodone. Would Dr. Jacinto Halim like to refill this non cardiac medication? Please address

## 2023-08-22 ENCOUNTER — Other Ambulatory Visit: Payer: Self-pay

## 2023-08-22 DIAGNOSIS — G4733 Obstructive sleep apnea (adult) (pediatric): Secondary | ICD-10-CM

## 2023-08-22 DIAGNOSIS — F5101 Primary insomnia: Secondary | ICD-10-CM

## 2023-08-22 NOTE — Telephone Encounter (Signed)
Pt is requesting a refill on Trazodone which is not a cardiac med. Dr. Jacinto Halim has refilled this. Does Dr. Jacinto Halim want to refill this

## 2023-08-22 NOTE — Progress Notes (Signed)
Medication was sent to pharmacy on 11/16

## 2023-08-23 NOTE — Progress Notes (Unsigned)
ASSESSMENT    Brief Narrative:  77 y.o.  male known to Dr. Marina Goodell  with a past medical history not limited to CKD4, hypothyroidism, CAD status post CABG x 4, OSA on CPAP, melanoma, chronic loose stool post cholecystectomy, colon polyps, GERD with remote esophageal stricture with prior dilation.  He was evaluated here 06/01/2023 for explosive diarrhea, now back for follow up  Bowel changes. Somewhat difficult to understand his bowel habits but overall seems to have chronic loose stool since cholecystectomy.    Chronic GERD. Taking Famotidine 20 mg ONCE daily (listed as BID on med list). Having breakthrough pyrosis after starting recently starting Ozempic  History of colon polyps. Small sessile serrated polyp removed in April 2021.   PLAN   --Cautious trial of cholesytramine 4 mg daily at lunch for possible bile acid related loose stool. Advised to hold for constipation ( hard stools or if doesn't have BM for a coupe of days) --Increase Pepcid to 20 mg BID  --Call with condition update in a couple of weeks.  --5 year polyp surveillance colonoscopy due April 2026   HPI   Chief complaint : diarrhea follow up. Having heartburn  Brief GI History:  Mr. Cancelliere has had chronic loose stool since his cholecystectomy in 2022.  He was seen here 06/01/2023 for explosive, nonbloody diarrhea.  Please refer to that office note for more details.  He had correlated the bowel change to increased dose of hyaluronic acid.  The diarrhea had already improved after cutting back on the hyaluronic acid and taking Imodium.  In fact at the time of our visit he was actually constipated.   It was recommended that he hold off on Imodium and try and stay at current dose of hyaluronic acid.  He had lost about 8 pounds but had been trying to lose.  Fecal elastase was obtained and normal.  Eventually it was thought he may be a candidate for cholestyramine for chronic loose stool post cholecystectomy.   Interval history  :  BMs seems to be at baseline . Having 2-3 BMs a day, not necessarily following meals. Often starts with passage of small pieces of formed / hard stool which is followed by liquid  stools. No blood in stool. No abdominal pain.   Trying to lose weight. Started Ozempic a week or so ago.  Having worsening heartburn since. Take Pepcid 20 mg once a day  GI History / Pertinent GI Studies   **All endoscopic studies may not be included here    April 2021 Surveillance colonoscopy - One 4 mm polyp in the ascending colon, removed with a cold snare. Resected and retrieved. - Internal hemorrhoids. - The examination was otherwise normal on direct and retroflexion views  Surgical [P], colon, ascending, polyp - SESSILE SERRATED POLYP WITHOUT CYTOLOGIC DYSPLASIA  Past Medical History:  Diagnosis Date   Adenomatous colon polyp    Allergy    seasonal   Arthritis    Chronic kidney disease, stage III (GFR 30-59 ml/min)    Depression    past in early 90s   GERD 02/24/2008   Gout    History of kidney stones    Hypothyroidism    Obesity (BMI 30-39.9)    Old inferior wall myocardial infarction    Sleep apnea    USES CPAP OCCASIONALLY   Unstable angina (HCC) 09/13/2013    Past Surgical History:  Procedure Laterality Date   CHOLECYSTECTOMY     COLONOSCOPY  12/25/2014   [place]  previously   CORONARY ANGIOPLASTY  1995   CORONARY ANGIOPLASTY WITH STENT PLACEMENT  1996   CORONARY ARTERY BYPASS GRAFT N/A 09/17/2013   Procedure: CORONARY ARTERY BYPASS GRAFTING (CABG);  Surgeon: Loreli Slot, MD;  Location: [facility] OR;  Service: Open Heart Surgery;  Laterality: N/A;   CYSTOSCOPY WITH RETROGRADE PYELOGRAM, URETEROSCOPY AND STENT PLACEMENT Right 01/11/2014   Procedure: CYSTOSCOPY WITH RETROGRADE PYELOGRAM, URETEROSCOPY AND STENT PLACEMENT;  Surgeon: Milford Cage, MD;  Location: [facility] ORS;  Service: Urology;  Laterality: Right;   CYSTOSCOPY WITH RETROGRADE PYELOGRAM, URETEROSCOPY AND STENT PLACEMENT  Right 02/01/2014   Procedure: CYSTOSCOPY WITH RETROGRADE PYELOGRAM, URETEROSCOPY AND STENT EXCHANGE;  Surgeon: Milford Cage, MD;  Location: [facility];  Service: Urology;  Laterality: Right;   EXPLORATION POST OPERATIVE OPEN HEART N/A 09/17/2013   Procedure: EXPLORATION POST OPERATIVE OPEN HEART;  Surgeon: Loreli Slot, MD;  Location: [facility] OR;  Service: Open Heart Surgery;  Laterality: N/A;   HOLMIUM LASER APPLICATION Right 02/01/2014   Procedure: HOLMIUM LASER APPLICATION;  Surgeon: Milford Cage, MD;  Location: [facility];  Service: Urology;  Laterality: Right;   IR RADIOLOGIST EVAL & MGMT  01/12/2022   LEFT HEART CATHETERIZATION WITH CORONARY ANGIOGRAM N/A 09/14/2013   Procedure: LEFT HEART CATHETERIZATION WITH CORONARY ANGIOGRAM;  Surgeon: Othella Boyer, MD;  Location: [facility] CATH LAB;  Service: Cardiovascular;  Laterality: N/A;   POLYPECTOMY  2022    Family History  Problem Relation Age of Onset   Emphysema Mother    Alcoholism Father    Stroke Father    Colon cancer Maternal Grandmother    Colon polyps Maternal Grandmother    Healthy Son    Healthy Son    Healthy Son    Esophageal cancer Neg Hx    Rectal cancer Neg Hx    Stomach cancer Neg Hx     Current Medications, Allergies, Family History and Social History were reviewed in [employer] electronic medical record.     Current Outpatient Medications  Medication Sig Dispense Refill   Acetaminophen (TYLENOL EXTRA STRENGTH PO) Take by mouth as needed.     allopurinol (ZYLOPRIM) 300 MG tablet Take 300 mg by mouth at bedtime.     aspirin EC 325 MG tablet Take 325 mg by mouth at bedtime.     Cholecalciferol (VITAMIN D-3) 25 MCG (1000 UT) CAPS Take 1 capsule by mouth daily at 2 PM.     Coenzyme Q10 (COQ-10 PO) Take 100 mg by mouth daily.     Dextromethorphan-guaiFENesin 10-200 MG/15ML LIQD Take by mouth as needed.     escitalopram (LEXAPRO) 10 MG tablet Take by mouth.     ezetimibe (ZETIA) 10 MG tablet Take 10  mg by mouth daily.     famotidine (PEPCID) 20 MG tablet Take 20 mg by mouth 2 (two) times daily.     FARXIGA 10 MG TABS tablet Take 10 mg by mouth daily.     FLUTICASONE PROPIONATE, NASAL, NA Place 2 Inhalers into the nose as needed.     Levothyroxine Sodium 100 MCG CAPS Take 100 mcg by mouth daily before breakfast.     loratadine (CLARITIN) 10 MG tablet Take 10 mg by mouth daily.     losartan (COZAAR) 25 MG tablet Take 1 tablet by mouth in the evening 90 tablet 2   metoprolol tartrate (LOPRESSOR) 25 MG tablet Take 1 tablet (25 mg total) by mouth 2 (two) times daily. 180 tablet 3   oxyCODONE-acetaminophen (PERCOCET/ROXICET) 5-325 MG  tablet Take 1 tablet by mouth as needed.     Probiotic Product (PROBIOTIC-10 PO) Take by mouth.     rosuvastatin (CRESTOR) 20 MG tablet Take 20 mg by mouth daily.     tamsulosin (FLOMAX) 0.4 MG CAPS capsule Take 0.4 mg by mouth daily after supper.     traZODone (DESYREL) 100 MG tablet TAKE 1 TABLET BY MOUTH AT BEDTIME 90 tablet 0   Ubiquinol 100 MG CAPS Take 1 capsule by mouth daily at 2 PM.     No current facility-administered medications for this visit.   Review of Systems: No chest pain. No shortness of breath. No urinary complaints.    Physical Exam  There were no vitals filed for this visit. Wt Readings from Last 3 Encounters:  06/01/23 248 lb (112.5 kg)  02/18/23 253 lb 12.8 oz (115.1 kg)  02/15/23 253 lb (114.8 kg)    BP 106/72   Pulse 76   Ht 5\' 9"  (1.753 m)   Wt 248 lb 6 oz (112.7 kg)   SpO2 96%   BMI 36.68 kg/m  Constitutional:  Pleasant, generally well appearing male in no acute distress. Psychiatric: Normal mood and affect. Behavior is normal. EENT: Pupils normal.  Conjunctivae are normal. No scleral icterus. Neck supple.  Cardiovascular: Normal rate, regular rhythm.  Pulmonary/chest: Effort normal and breath sounds normal. No wheezing, rales or rhonchi. Abdominal: Soft, nondistended, nontender. Bowel sounds active throughout. There  are no masses palpable. No hepatomegaly. Neurological: Alert and oriented to person place and time.    Willette Cluster, NP  08/23/2023, 8:55 PM

## 2023-08-24 ENCOUNTER — Ambulatory Visit: Payer: PPO | Admitting: Nurse Practitioner

## 2023-08-24 ENCOUNTER — Encounter: Payer: Self-pay | Admitting: Nurse Practitioner

## 2023-08-24 VITALS — BP 106/72 | HR 76 | Ht 69.0 in | Wt 248.4 lb

## 2023-08-24 DIAGNOSIS — Z860101 Personal history of adenomatous and serrated colon polyps: Secondary | ICD-10-CM | POA: Diagnosis not present

## 2023-08-24 DIAGNOSIS — K219 Gastro-esophageal reflux disease without esophagitis: Secondary | ICD-10-CM | POA: Diagnosis not present

## 2023-08-24 DIAGNOSIS — R195 Other fecal abnormalities: Secondary | ICD-10-CM

## 2023-08-24 DIAGNOSIS — K529 Noninfective gastroenteritis and colitis, unspecified: Secondary | ICD-10-CM | POA: Diagnosis not present

## 2023-08-24 MED ORDER — FAMOTIDINE 20 MG PO TABS: 20.0000 mg | ORAL_TABLET | Freq: Two times a day (BID) | ORAL | 0 refills | Status: AC

## 2023-08-24 MED ORDER — CHOLESTYRAMINE 4 G PO PACK: 4.0000 g | PACK | Freq: Every day | ORAL | 3 refills | Status: DC

## 2023-08-24 NOTE — Progress Notes (Signed)
Noted  

## 2023-08-24 NOTE — Patient Instructions (Addendum)
_______________________________________________________  If your blood pressure at your visit was 140/90 or greater, please contact your primary care physician to follow up on this.  _______________________________________________________  If you are age 76 or older, your body mass index should be between 23-30. Your Body mass index is 36.68 kg/m. If this is out of the aforementioned range listed, please consider follow up with your Primary Care Provider.  If you are age 107 or younger, your body mass index should be between 19-25. Your Body mass index is 36.68 kg/m. If this is out of the aformentioned range listed, please consider follow up with your Primary Care Provider.   ________________________________________________________  The [facility] providers would like to encourage you to use [facility] to communicate with providers for non-urgent requests or questions.  Due to long hold times on the telephone, sending your provider a message by [facility] may be a faster and more efficient way to get a response.  Please allow 48 business hours for a response.  Please remember that this is for non-urgent requests.  _______________________________________________________  Due to recent changes in healthcare laws, you may see the results of your imaging and laboratory studies on MyChart before your provider has had a chance to review them.  We understand that in some cases there may be results that are confusing or concerning to you. Not all laboratory results come back in the same time frame and the provider may be waiting for multiple results in order to interpret others.  Please give [place] 48 hours in order for your provider to thoroughly review all the results before contacting the office for clarification of your results.     Please increase Pepicid to 20mg  2 times a day  We have sent the following medications to your pharmacy for you to pick up at your convenience: Question daily with lunch. Take 2  hours before after all other medications  Please call us in 2 weeks with an update.  It was a pleasure to see you today!  Thank you for trusting me with your gastrointestinal care!

## 2023-08-26 ENCOUNTER — Telehealth: Payer: Self-pay | Admitting: Nurse Practitioner

## 2023-08-26 NOTE — Telephone Encounter (Signed)
Inbound call from patients wife stating that Dalton Mitchell is going to be too expensive and is requesting a alternate medication. Please advise.

## 2023-08-26 NOTE — Telephone Encounter (Signed)
Please advise regarding medication

## 2023-08-29 MED ORDER — COLESTIPOL HCL 1 G PO TABS: 1.0000 g | ORAL_TABLET | Freq: Two times a day (BID) | ORAL | 5 refills | Status: DC

## 2023-08-29 NOTE — Telephone Encounter (Signed)
Patient made aware.

## 2023-10-31 DIAGNOSIS — D4101 Neoplasm of uncertain behavior of right kidney: Secondary | ICD-10-CM | POA: Diagnosis not present

## 2023-10-31 DIAGNOSIS — N281 Cyst of kidney, acquired: Secondary | ICD-10-CM | POA: Diagnosis not present

## 2023-10-31 DIAGNOSIS — N200 Calculus of kidney: Secondary | ICD-10-CM | POA: Diagnosis not present

## 2023-11-07 DIAGNOSIS — G4733 Obstructive sleep apnea (adult) (pediatric): Secondary | ICD-10-CM | POA: Diagnosis not present

## 2023-11-10 DIAGNOSIS — N1832 Chronic kidney disease, stage 3b: Secondary | ICD-10-CM | POA: Diagnosis not present

## 2023-11-14 DIAGNOSIS — R768 Other specified abnormal immunological findings in serum: Secondary | ICD-10-CM | POA: Diagnosis not present

## 2023-11-14 DIAGNOSIS — N2581 Secondary hyperparathyroidism of renal origin: Secondary | ICD-10-CM | POA: Diagnosis not present

## 2023-11-14 DIAGNOSIS — N1832 Chronic kidney disease, stage 3b: Secondary | ICD-10-CM | POA: Diagnosis not present

## 2023-11-14 DIAGNOSIS — N2889 Other specified disorders of kidney and ureter: Secondary | ICD-10-CM | POA: Diagnosis not present

## 2023-11-14 DIAGNOSIS — N200 Calculus of kidney: Secondary | ICD-10-CM | POA: Diagnosis not present

## 2023-11-14 DIAGNOSIS — I129 Hypertensive chronic kidney disease with stage 1 through stage 4 chronic kidney disease, or unspecified chronic kidney disease: Secondary | ICD-10-CM | POA: Diagnosis not present

## 2023-11-14 DIAGNOSIS — N400 Enlarged prostate without lower urinary tract symptoms: Secondary | ICD-10-CM | POA: Diagnosis not present

## 2023-11-14 DIAGNOSIS — R809 Proteinuria, unspecified: Secondary | ICD-10-CM | POA: Diagnosis not present

## 2023-11-22 DIAGNOSIS — R053 Chronic cough: Secondary | ICD-10-CM | POA: Diagnosis not present

## 2023-11-22 DIAGNOSIS — E785 Hyperlipidemia, unspecified: Secondary | ICD-10-CM | POA: Diagnosis not present

## 2023-11-22 DIAGNOSIS — I700 Atherosclerosis of aorta: Secondary | ICD-10-CM | POA: Diagnosis not present

## 2023-11-22 DIAGNOSIS — R809 Proteinuria, unspecified: Secondary | ICD-10-CM | POA: Diagnosis not present

## 2023-11-22 DIAGNOSIS — I129 Hypertensive chronic kidney disease with stage 1 through stage 4 chronic kidney disease, or unspecified chronic kidney disease: Secondary | ICD-10-CM | POA: Diagnosis not present

## 2023-11-22 DIAGNOSIS — F418 Other specified anxiety disorders: Secondary | ICD-10-CM | POA: Diagnosis not present

## 2023-11-22 DIAGNOSIS — F339 Major depressive disorder, recurrent, unspecified: Secondary | ICD-10-CM | POA: Diagnosis not present

## 2023-11-22 DIAGNOSIS — E1122 Type 2 diabetes mellitus with diabetic chronic kidney disease: Secondary | ICD-10-CM | POA: Diagnosis not present

## 2023-11-22 DIAGNOSIS — N184 Chronic kidney disease, stage 4 (severe): Secondary | ICD-10-CM | POA: Diagnosis not present

## 2023-11-22 DIAGNOSIS — K862 Cyst of pancreas: Secondary | ICD-10-CM | POA: Diagnosis not present

## 2023-11-22 DIAGNOSIS — Z23 Encounter for immunization: Secondary | ICD-10-CM | POA: Diagnosis not present

## 2023-11-22 DIAGNOSIS — M4800 Spinal stenosis, site unspecified: Secondary | ICD-10-CM | POA: Diagnosis not present

## 2023-11-24 ENCOUNTER — Other Ambulatory Visit: Payer: Self-pay | Admitting: Cardiology

## 2023-11-24 DIAGNOSIS — F5101 Primary insomnia: Secondary | ICD-10-CM

## 2023-11-24 DIAGNOSIS — G4733 Obstructive sleep apnea (adult) (pediatric): Secondary | ICD-10-CM

## 2023-12-20 DIAGNOSIS — E1122 Type 2 diabetes mellitus with diabetic chronic kidney disease: Secondary | ICD-10-CM | POA: Diagnosis not present

## 2023-12-20 DIAGNOSIS — G4733 Obstructive sleep apnea (adult) (pediatric): Secondary | ICD-10-CM | POA: Diagnosis not present

## 2023-12-20 DIAGNOSIS — R058 Other specified cough: Secondary | ICD-10-CM | POA: Diagnosis not present

## 2023-12-20 DIAGNOSIS — K219 Gastro-esophageal reflux disease without esophagitis: Secondary | ICD-10-CM | POA: Diagnosis not present

## 2023-12-23 ENCOUNTER — Other Ambulatory Visit: Payer: Self-pay | Admitting: Internal Medicine

## 2023-12-23 DIAGNOSIS — K862 Cyst of pancreas: Secondary | ICD-10-CM

## 2023-12-23 DIAGNOSIS — M170 Bilateral primary osteoarthritis of knee: Secondary | ICD-10-CM | POA: Diagnosis not present

## 2023-12-28 DIAGNOSIS — H5203 Hypermetropia, bilateral: Secondary | ICD-10-CM | POA: Diagnosis not present

## 2023-12-28 DIAGNOSIS — H524 Presbyopia: Secondary | ICD-10-CM | POA: Diagnosis not present

## 2023-12-28 DIAGNOSIS — E1165 Type 2 diabetes mellitus with hyperglycemia: Secondary | ICD-10-CM | POA: Diagnosis not present

## 2023-12-28 DIAGNOSIS — H25813 Combined forms of age-related cataract, bilateral: Secondary | ICD-10-CM | POA: Diagnosis not present

## 2023-12-28 DIAGNOSIS — H52223 Regular astigmatism, bilateral: Secondary | ICD-10-CM | POA: Diagnosis not present

## 2024-02-09 NOTE — Progress Notes (Signed)
 Office Visit Note  Patient: Dalton Mitchell             Date of Birth: 07/01/1946           MRN: [id number]             PCP: Jeannine Milroy., MD Referring: Jeannine Milroy., MD Visit Date: 02/16/2024 Occupation: @GUAROCC @  Subjective:  Pain in both knees   History of Present Illness: Dalton Mitchell is a 78 y.o. male with positive ANA, gout and osteoarthritis.  He denies any history of oral ulcers, nasal ulcers, malar rash, photosensitivity, Raynaud's or lymphadenopathy.  He has mild symptoms of dry mouth.  He has not had a gout flare since he was started on allopurinol .  He continues to have discomfort in his knee joints.  He states he is not ready for total knee replacement.  None of the joints are painful or swollen.    Activities of Daily Living:  Patient reports morning stiffness for 0 minute.   Patient Denies nocturnal pain.  Difficulty dressing/grooming: Denies Difficulty climbing stairs: Reports Difficulty getting out of chair: Reports Difficulty using hands for taps, buttons, cutlery, and/or writing: Denies  Review of Systems  Constitutional:  Positive for fatigue.  HENT:  Positive for mouth dryness. Negative for mouth sores.   Eyes:  Negative for dryness.  Respiratory:  Negative for shortness of breath.   Cardiovascular:  Negative for chest pain and palpitations.  Gastrointestinal:  Negative for blood in stool, constipation and diarrhea.  Endocrine: Positive for increased urination.  Genitourinary:  Negative for involuntary urination.  Musculoskeletal:  Positive for joint pain, joint pain, myalgias and myalgias. Negative for gait problem, joint swelling, muscle weakness, morning stiffness and muscle tenderness.  Skin:  Negative for color change, rash, hair loss and sensitivity to sunlight.  Allergic/Immunologic: Negative for susceptible to infections.  Neurological:  Negative for dizziness and headaches.  Hematological:  Negative for swollen glands.   Psychiatric/Behavioral:  Positive for depressed mood and sleep disturbance. The patient is not nervous/anxious.     PMFS History:  Patient Active Problem List   Diagnosis Date Noted   MCI (mild cognitive impairment) with memory loss 11/09/2022   Primary hypertension 04/09/2022   Excessive daytime sleepiness 03/28/2020   OSA on CPAP 03/28/2020   Mass of finger of right hand 12/02/2017   Pain in finger of right hand 12/02/2017   Osteoarthritis of finger of right hand 12/02/2017   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   Obesity (BMI 30-39.9)    Old inferior wall myocardial infarction    Sleep apnea    GERD 02/24/2008   Hypothyroidism    Hypercholesteremia    Gout    Coronary artery disease involving native coronary artery of native heart without angina pectoris    Chronic kidney disease (CKD), stage IV (severe) (HCC)    History of kidney stones     Past Medical History:  Diagnosis Date   Adenomatous colon polyp    Allergy    seasonal   Arthritis    Chronic kidney disease, stage III (GFR 30-59 ml/min)    Depression    past in early 90s   Diabetes (HCC)    GERD 02/24/2008   Gout    History of kidney stones    Hypothyroidism    Obesity (BMI 30-39.9)    Old inferior wall myocardial infarction    Sleep apnea    USES CPAP OCCASIONALLY   Unstable angina (HCC) 09/13/2013  Family History  Problem Relation Age of Onset   Emphysema Mother    Alcoholism Father    Stroke Father    Colon cancer Maternal Grandmother    Colon polyps Maternal Grandmother    Healthy Son    Healthy Son    Healthy Son    Esophageal cancer Neg Hx    Rectal cancer Neg Hx    Stomach cancer Neg Hx    Past Surgical History:  Procedure Laterality Date   CHOLECYSTECTOMY     COLONOSCOPY  12/25/2014   [place] previously   CORONARY ANGIOPLASTY  1995   CORONARY ANGIOPLASTY WITH STENT PLACEMENT  1996   CORONARY ARTERY BYPASS GRAFT N/A 09/17/2013   Procedure: CORONARY ARTERY BYPASS GRAFTING (CABG);  Surgeon: Zelphia Higashi, MD;  Location: [facility] OR;  Service: Open Heart Surgery;  Laterality: N/A;   CYSTOSCOPY WITH RETROGRADE PYELOGRAM, URETEROSCOPY AND STENT PLACEMENT Right 01/11/2014   Procedure: CYSTOSCOPY WITH RETROGRADE PYELOGRAM, URETEROSCOPY AND STENT PLACEMENT;  Surgeon: Soledad Dupes, MD;  Location: [facility];  Service: Urology;  Laterality: Right;   CYSTOSCOPY WITH RETROGRADE PYELOGRAM, URETEROSCOPY AND STENT PLACEMENT Right 02/01/2014   Procedure: CYSTOSCOPY WITH RETROGRADE PYELOGRAM, URETEROSCOPY AND STENT EXCHANGE;  Surgeon: Soledad Dupes, MD;  Location: [facility];  Service: Urology;  Laterality: Right;   EXPLORATION POST OPERATIVE OPEN HEART N/A 09/17/2013   Procedure: EXPLORATION POST OPERATIVE OPEN HEART;  Surgeon: Zelphia Higashi, MD;  Location: [facility] OR;  Service: Open Heart Surgery;  Laterality: N/A;   HOLMIUM LASER APPLICATION Right 02/01/2014   Procedure: HOLMIUM LASER APPLICATION;  Surgeon: Soledad Dupes, MD;  Location: [facility];  Service: Urology;  Laterality: Right;   IR RADIOLOGIST EVAL & MGMT  01/12/2022   LEFT HEART CATHETERIZATION WITH CORONARY ANGIOGRAM N/A 09/14/2013   Procedure: LEFT HEART CATHETERIZATION WITH CORONARY ANGIOGRAM;  Surgeon: Dorsey Gault, MD;  Location: [facility] CATH LAB;  Service: Cardiovascular;  Laterality: N/A;   POLYPECTOMY  2022   Social History   Social History Narrative   Drinks about 1-2 caffeine beverages a day    Immunization History  Administered Date(s) Administered   PFIZER(Purple Top)SARS-COV-2 Vaccination 11/24/2019, 12/15/2019   Tdap 12/20/2015     Objective: Vital Signs: BP 129/86 (BP Location: Left Arm, Patient Position: Sitting, Cuff Size: Large)   Pulse 87   Resp 16   Ht 5\' 9"  (1.753 m)   Wt 248 lb (112.5 kg)   BMI 36.62 kg/m    Physical Exam Vitals and nursing note reviewed.  Constitutional:      Appearance: He is well-developed.  HENT:     Head: Normocephalic and atraumatic.  Eyes:      Conjunctiva/sclera: Conjunctivae normal.     Pupils: Pupils are equal, round, and reactive to light.  Cardiovascular:     Rate and Rhythm: Normal rate and regular rhythm.     Heart sounds: Normal heart sounds.  Pulmonary:     Effort: Pulmonary effort is normal.     Breath sounds: Normal breath sounds.  Abdominal:     General: Bowel sounds are normal.     Palpations: Abdomen is soft.  Musculoskeletal:     Cervical back: Normal range of motion and neck supple.  Skin:    General: Skin is warm and dry.     Capillary Refill: Capillary refill takes less than 2 seconds.  Neurological:     Mental Status: He is alert and oriented to person, place, and time.  Psychiatric:  Behavior: Behavior normal.      Musculoskeletal Exam: Cervical spine was in good range of motion.  He had discomfort range of motion of the lumbar spine.  Shoulders, elbows, wrist joints with good range of motion.  Bilateral PIP and DIP thickening with no synovitis was noted.  Hip joints were in good range of motion.  Knee joints with good range of motion with some limitation of extension.  There was no warmth swelling or effusion.  There was no tenderness over ankles or MTPs.  CDAI Exam: CDAI Score: -- Patient Global: --; Provider Global: -- Swollen: --; Tender: -- Joint Exam 02/16/2024   No joint exam has been documented for this visit   There is currently no information documented on the homunculus. Go to the Rheumatology activity and complete the homunculus joint exam.  Investigation: No additional findings.  Imaging: No results found.  Recent Labs: Lab Results  Component Value Date   WBC 11.0 (H) 01/04/2022   HGB 15.0 01/04/2022   PLT 269 01/04/2022   NA 143 05/26/2022   K 5.1 05/26/2022   CL 104 05/26/2022   CO2 25 05/26/2022   GLUCOSE 138 (H) 05/26/2022   BUN 26 05/26/2022   CREATININE 1.91 (H) 05/26/2022   BILITOT 0.6 12/30/2021   ALKPHOS 43 12/30/2021   AST 28 12/30/2021   ALT 23  12/30/2021   PROT 5.6 (L) 12/30/2021   ALBUMIN  2.3 (L) 12/30/2021   CALCIUM  10.6 (H) 05/26/2022   GFRAA 29 (L) 01/25/2014    Speciality Comments: No specialty comments available.  Procedures:  No procedures performed Allergies: Bupropion, Morphine  and codeine, Ace inhibitors, and Lisinopril   Assessment / Plan:     Visit Diagnoses: Positive ANA (antinuclear antibody) - ANA 1: 160NS, ENA negative, complements normal, history of fatigue and photosensitivity.  Patient denies any history of oral ulcers, nasal ulcers, malar rash, photosensitivity, Raynaud's, lymphadenopathy.  No further workup is needed.  I advised him to contact me if he develops any new symptoms.  Idiopathic chronic gout of multiple sites without tophus - allopurinol  300mg  daily. uric acid: 5.5 on 01/28/2023.  Patient states his labs are followed by Dr. Bernetta Brilliant.  He has not had a gout flare in a long time.  Primary osteoarthritis of both knees - Severe end-stage osteoarthritis of bilateral knee joints.  Followed by Dr. Agatha Horsfall and was advised total knee replacement.  Patient does not want total knee replacement.  Her lower extremity muscle strengthening exercises were demonstrated in the office.  He was encouraged to do exercise on a regular basis.  Weight loss diet and exercise was also discussed.  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  He is followed by nephrology.  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  History of hyperlipidemia  S/P CABG x 4  Essential hypertension-blood pressure was elevated at 143/94.  Repeat blood pressure was 129/86.    History of gastroesophageal reflux (GERD)  Old inferior wall myocardial infarction  History of hypothyroidism  History of melanoma  MCI (mild cognitive impairment) with memory loss  History of nephrolithiasis  Excessive daytime sleepiness  OSA on  CPAP  Orders: No orders of the defined types were placed in this encounter.  No orders of the defined types were placed in this encounter.    Follow-Up Instructions: Return in about 1 year (around 02/15/2025) for Osteoarthritis, Gout, +ANA.   Nicholas Bari, MD  Note - This record has been created using [employer].  Chart creation errors have been sought, but may not always  have been located. Such creation errors do not reflect on  the standard of medical care.

## 2024-02-15 ENCOUNTER — Ambulatory Visit: Payer: PPO | Admitting: Cardiology

## 2024-02-16 ENCOUNTER — Ambulatory Visit: Payer: PPO | Attending: Rheumatology | Admitting: Rheumatology

## 2024-02-16 ENCOUNTER — Encounter: Payer: Self-pay | Admitting: Rheumatology

## 2024-02-16 VITALS — BP 129/86 | HR 87 | Resp 16 | Ht 69.0 in | Wt 248.0 lb

## 2024-02-16 DIAGNOSIS — M1A09X0 Idiopathic chronic gout, multiple sites, without tophus (tophi): Secondary | ICD-10-CM | POA: Diagnosis not present

## 2024-02-16 DIAGNOSIS — I10 Essential (primary) hypertension: Secondary | ICD-10-CM

## 2024-02-16 DIAGNOSIS — G3184 Mild cognitive impairment, so stated: Secondary | ICD-10-CM | POA: Diagnosis not present

## 2024-02-16 DIAGNOSIS — R768 Other specified abnormal immunological findings in serum: Secondary | ICD-10-CM

## 2024-02-16 DIAGNOSIS — I252 Old myocardial infarction: Secondary | ICD-10-CM

## 2024-02-16 DIAGNOSIS — G4733 Obstructive sleep apnea (adult) (pediatric): Secondary | ICD-10-CM

## 2024-02-16 DIAGNOSIS — N184 Chronic kidney disease, stage 4 (severe): Secondary | ICD-10-CM

## 2024-02-16 DIAGNOSIS — N2581 Secondary hyperparathyroidism of renal origin: Secondary | ICD-10-CM | POA: Diagnosis not present

## 2024-02-16 DIAGNOSIS — G4719 Other hypersomnia: Secondary | ICD-10-CM

## 2024-02-16 DIAGNOSIS — Z8719 Personal history of other diseases of the digestive system: Secondary | ICD-10-CM | POA: Diagnosis not present

## 2024-02-16 DIAGNOSIS — Z85820 Personal history of malignant melanoma of skin: Secondary | ICD-10-CM | POA: Diagnosis not present

## 2024-02-16 DIAGNOSIS — M170 Bilateral primary osteoarthritis of knee: Secondary | ICD-10-CM

## 2024-02-16 DIAGNOSIS — Z951 Presence of aortocoronary bypass graft: Secondary | ICD-10-CM

## 2024-02-16 DIAGNOSIS — Z8639 Personal history of other endocrine, nutritional and metabolic disease: Secondary | ICD-10-CM | POA: Diagnosis not present

## 2024-02-16 DIAGNOSIS — Z87442 Personal history of urinary calculi: Secondary | ICD-10-CM

## 2024-02-20 DIAGNOSIS — G4733 Obstructive sleep apnea (adult) (pediatric): Secondary | ICD-10-CM | POA: Diagnosis not present

## 2024-02-24 ENCOUNTER — Ambulatory Visit: Attending: Cardiology | Admitting: Cardiology

## 2024-02-24 ENCOUNTER — Encounter: Payer: Self-pay | Admitting: Cardiology

## 2024-02-24 VITALS — BP 120/82 | HR 92 | Ht 69.5 in | Wt 246.2 lb

## 2024-02-24 DIAGNOSIS — I2510 Atherosclerotic heart disease of native coronary artery without angina pectoris: Secondary | ICD-10-CM

## 2024-02-24 DIAGNOSIS — G4733 Obstructive sleep apnea (adult) (pediatric): Secondary | ICD-10-CM | POA: Diagnosis not present

## 2024-02-24 DIAGNOSIS — R42 Dizziness and giddiness: Secondary | ICD-10-CM

## 2024-02-24 DIAGNOSIS — I10 Essential (primary) hypertension: Secondary | ICD-10-CM

## 2024-02-24 DIAGNOSIS — N1832 Chronic kidney disease, stage 3b: Secondary | ICD-10-CM

## 2024-02-24 DIAGNOSIS — T50905D Adverse effect of unspecified drugs, medicaments and biological substances, subsequent encounter: Secondary | ICD-10-CM | POA: Diagnosis not present

## 2024-02-24 DIAGNOSIS — T50905A Adverse effect of unspecified drugs, medicaments and biological substances, initial encounter: Secondary | ICD-10-CM

## 2024-02-24 NOTE — Patient Instructions (Signed)
 Medication Instructions:  Your physician recommends that you continue on your current medications as directed. Please refer to the Current Medication list given to you today.  *If you need a refill on your cardiac medications before your next appointment, please call your pharmacy*  Lab Work: none If you have labs (blood work) drawn today and your tests are completely normal, you will receive your results only by: MyChart Message (if you have MyChart) OR A paper copy in the mail If you have any lab test that is abnormal or we need to change your treatment, we will call you to review the results.  Testing/Procedures: none  Follow-Up: At [facility], you and your health needs are our priority.  As part of our continuing mission to provide you with exceptional heart care, our providers are all part of one team.  This team includes your primary Cardiologist (physician) and Advanced Practice Providers or APPs (Physician Assistants and Nurse Practitioners) who all work together to provide you with the care you need, when you need it.  Your next appointment:   12 month(s)  Provider:   Knox Perl, MD    We recommend signing up for the patient portal called "MyChart".  Sign up information is provided on this After Visit Summary.  MyChart is used to connect with patients for Virtual Visits (Telemedicine).  Patients are able to view lab/test results, encounter notes, upcoming appointments, etc.  Non-urgent messages can be sent to your provider as well.   To learn more about what you can do with MyChart, go to [email or web address].

## 2024-02-24 NOTE — Progress Notes (Signed)
 " Cardiology Office Note:  .   Date:  02/27/2024  ID:  Dalton Mitchell, DOB 1946-09-20, MRN [id number] PCP: Loreli Elsie JONETTA Mickey., MD  [facility] Providers Cardiologist:  Gordy Bergamo, MD   History of Present Illness: .   Dalton Mitchell is a 78 y.o. Caucasian male with coronary artery disease S/P CABG 2014, obesity, hypertension, hyperlipidemia, stage IV chronic kidney disease, history of inferior MI in 1995 SP balloon angioplasty to RCA, history of CABG x4 in 2014 or 2015, mild sleep apnea has not been compliant with CPAP, follows Dr. Chalice.  This is an annual visit.  Discussed the use of AI scribe software for clinical note transcription with the patient, who gave verbal consent to proceed.  History of Present Illness Dalton Mitchell is a 78 year old male with coronary artery disease. He describes significant fatigue, preferring to stay in bed due to very low energy. He feels exhausted after minimal exertion, such as walking short distances.  He has a history of coronary artery disease, having undergone bypass surgery in 2000 and experienced a heart attack affecting the inferior wall of the heart. He also has a history of kidney disease. He is accompanied by his wife.   Labs    Lab Results  Component Value Date   NA 143 05/26/2022   K 5.1 05/26/2022   CO2 25 05/26/2022   GLUCOSE 138 (H) 05/26/2022   BUN 26 05/26/2022   CREATININE 1.91 (H) 05/26/2022   CALCIUM  10.6 (H) 05/26/2022   GFR 32.12 (L) 08/20/2020   EGFR 36 (L) 05/26/2022   GFRNONAA 30 (L) 01/04/2022      Latest Ref Rng & Units 05/26/2022    2:42 PM 01/04/2022   11:55 AM 12/30/2021    8:07 PM  BMP  Glucose 70 - 99 mg/dL 861  844  881   BUN 8 - 27 mg/dL 26  28  30    Creatinine 0.76 - 1.27 mg/dL 8.08  7.73  7.66   BUN/Creat Ratio 10 - 24 14     Sodium 134 - 144 mmol/L 143  133  136   Potassium 3.5 - 5.2 mmol/L 5.1  4.0  4.1   Chloride 96 - 106 mmol/L 104  98  105   CO2 20 - 29 mmol/L 25  24  23     Calcium  8.6 - 10.2 mg/dL 89.3  9.4  8.0       Latest Ref Rng & Units 01/04/2022   11:55 AM 12/30/2021    8:07 PM 12/30/2021    1:38 PM  CBC  WBC 4.0 - 10.5 K/uL 11.0  8.3    Hemoglobin 13.0 - 17.0 g/dL 84.9  87.1  83.2   Hematocrit 39.0 - 52.0 % 45.6  40.1  49.0   Platelets 150 - 400 K/uL 269  188     Lab Results  Component Value Date   HGBA1C 5.8 (H) 09/21/2013    Lab Results  Component Value Date   TSH 4.757 (H) 10/24/2021    External Labs:  PCP labs on Care Everywhere 11/14/2023:  Urinary protein to creatinine ratio 1495.  Serum glucose 210 mg, BUN 13, creatinine 2.03, EGFR 33 mL, sodium 143, potassium 4.3.  Magnesium  2.2.  Hb 14.7.  ROS  Review of Systems  Constitutional: Positive for malaise/fatigue.  Cardiovascular:  Negative for chest pain, dyspnea on exertion and leg swelling.    Physical Exam:   VS:  BP 120/82   Pulse 92  Ht 5' 9.5 (1.765 m)   Wt 246 lb 3.2 oz (111.7 kg)   SpO2 94%   BMI 35.84 kg/m    Wt Readings from Last 3 Encounters:  02/24/24 246 lb 3.2 oz (111.7 kg)  02/16/24 248 lb (112.5 kg)  08/24/23 248 lb 6 oz (112.7 kg)    Physical Exam Constitutional:      Appearance: He is obese.  Neck:     Vascular: No carotid bruit or JVD.  Cardiovascular:     Rate and Rhythm: Normal rate and regular rhythm.     Pulses: Intact distal pulses.     Heart sounds: Normal heart sounds. No murmur heard.    No gallop.  Pulmonary:     Effort: Pulmonary effort is normal.     Breath sounds: Normal breath sounds.  Abdominal:     General: Bowel sounds are normal.     Palpations: Abdomen is soft.  Musculoskeletal:     Right lower leg: No edema.     Left lower leg: No edema.    Studies Reviewed: SABRA     EKG:    EKG Interpretation Date/Time:  Friday Feb 24 2024 14:13:55 EDT Ventricular Rate:  92 PR Interval:  204 QRS Duration:  88 QT Interval:  348 QTC Calculation: 430 R Axis:   28  Text Interpretation: EKG 02/24/2024: Normal sinus rhythm, PR  interval 200 ms, inferior infarct old.  No evidence of ischemia.  When compared to 10/24/2021, poor R wave progression not present. Confirmed by Yomayra Tate, Jagadeesh [phone]) on 02/24/2024 2:38:14 PM   EKG 02/15/2023: Sinus rhythm with first-degree AV block at the rate of 80 bpm, normal axis, poor R progression, probably normal variant.  No evidence of ischemia.   Medications and allergies    Allergies  Allergen Reactions   Bupropion     Other reaction(s): hallucinations   Morphine  And Codeine Other (See Comments)    STATES HORRIBLE HALLUCINATIONS.   Ace Inhibitors Cough   Lisinopril Other (See Comments) and Cough     Current Outpatient Medications:    Acetaminophen  (TYLENOL  EXTRA STRENGTH PO), Take by mouth as needed., Disp: , Rfl:    albuterol (VENTOLIN HFA) 108 (90 Base) MCG/ACT inhaler, 2 puffs as needed Inhalation every 6 hours for 30 days As needed, Disp: , Rfl:    allopurinol  (ZYLOPRIM ) 300 MG tablet, Take 300 mg by mouth daily., Disp: , Rfl:    aspirin  EC 325 MG tablet, Take 325 mg by mouth at bedtime., Disp: , Rfl:    Cholecalciferol (VITAMIN D-3) 25 MCG (1000 UT) CAPS, Take 1 capsule by mouth daily at 2 PM., Disp: , Rfl:    Coenzyme Q10 (COQ-10 PO), Take 100 mg by mouth daily., Disp: , Rfl:    Dextromethorphan-guaiFENesin 10-200 MG/15ML LIQD, Take by mouth as needed., Disp: , Rfl:    escitalopram (LEXAPRO) 20 MG tablet, Take 20 mg by mouth daily., Disp: , Rfl:    ezetimibe (ZETIA) 10 MG tablet, Take 10 mg by mouth daily., Disp: , Rfl:    famotidine  (PEPCID ) 20 MG tablet, Take 1 tablet (20 mg total) by mouth 2 (two) times daily., Disp: 60 tablet, Rfl: 0   FLUTICASONE  PROPIONATE, NASAL, NA, Place 2 Inhalers into the nose as needed., Disp: , Rfl:    Levothyroxine  Sodium 100 MCG CAPS, Take 100 mcg by mouth daily before breakfast., Disp: , Rfl:    loratadine (CLARITIN) 10 MG tablet, Take 10 mg by mouth daily., Disp: , Rfl:  oxyCODONE -acetaminophen  (PERCOCET/ROXICET) 5-325 MG tablet, Take  1 tablet by mouth as needed., Disp: , Rfl:    pantoprazole  (PROTONIX ) 20 MG tablet, Take 20 mg by mouth daily., Disp: , Rfl:    Probiotic Product (PROBIOTIC-10 PO), Take by mouth., Disp: , Rfl:    rosuvastatin (CRESTOR) 20 MG tablet, Take 20 mg by mouth daily., Disp: , Rfl:    tamsulosin  (FLOMAX ) 0.4 MG CAPS capsule, Take 0.4 mg by mouth daily after supper., Disp: , Rfl:    traZODone  (DESYREL ) 50 MG tablet, Take 50 mg by mouth at bedtime as needed., Disp: , Rfl:    No orders of the defined types were placed in this encounter.    Medications Discontinued During This Encounter  Medication Reason   Semaglutide, 1 MG/DOSE, (OZEMPIC, 1 MG/DOSE,) 4 MG/3ML SOPN Discontinued by provider   Ubiquinol 100 MG CAPS Discontinued by provider     ASSESSMENT AND PLAN: .      ICD-10-CM   1. Drug-induced dizziness  R42 EKG 12-Lead   T50.905A     2. Coronary artery disease involving native coronary artery of native heart without angina pectoris  I25.10 EKG 12-Lead    3. Primary hypertension  I10 EKG 12-Lead    4. Stage 3b chronic kidney disease (HCC)  N18.32 EKG 12-Lead    5. Obstructive sleep apnea on CPAP  G47.33 EKG 12-Lead      Assessment and Plan Assessment & Plan Coronary artery disease with history of inferior wall infarct Coronary artery disease is well-managed with stable EKG findings. Echocardiogram from 03/31/2021 shows normal heart function. Stress test in 2021 did not reveal any blockages. Cholesterol levels are well controlled with total cholesterol at 114, triglycerides at 79, HDL at 40, and LDL at 58.  Chronic kidney disease, unspecified Chronic kidney disease is well-managed with no specific changes or concerns during the visit.  Diabetes mellitus Diabetes mellitus management previously included Ozempic, which caused adverse effects. Discussed alternative GLP-1 agonists for diabetes and weight management. Emphasized the importance of managing diabetes to prevent further  complications. - Discuss with PCP about alternative GLP-1 agonists for diabetes management - Discussed slow eating, small meals, to avoid side effects.  It is unfortunate that he describes significant fatigue and is essentially homebound although he is able to do physical activity, related to both obesity, obesity hypoventilation, deconditioning and also lack of insight regarding OSA.  Obesity Obesity contributes to fatigue and may impact mobility and surgical outcomes. Previous trial of Ozempic resulted in adverse effects including diarrhea and weakness. Discussed potential benefits of weight loss for overall health and sleep apnea management. Consideration of alternative GLP-1 agonists due to previous adverse effects with Ozempic. - Discuss with PCP about trying a different GLP-1 agonist for weight loss - Advise on slow eating and small portion sizes to minimize side effects of weight loss medications  Sleep apnea Suspected sleep apnea contributes to fatigue and daytime sleepiness. Discussed the importance of CPAP use and potential benefits of improved energy levels and weight management. Emphasized the need for consistent use to improve symptoms and overall health. - Encourage use of CPAP machine consistently - Advise on weight loss to help manage sleep apnea  Knee osteoarthritis Knee osteoarthritis impacts mobility. Previous recommendation for bilateral knee replacement surgery, but weight is a concern for surgical outcomes. Encouraged weight management to improve surgical candidacy. - Discuss weight management strategies to improve surgical candidacy  Otherwise he is on best medical therapy, no changes were recommended today, extensive discussion with  the patient and his wife at the bedside regarding lifestyle modification, regular exercise and weight loss and dietary modification and compliance with sleep apnea-CPAP.  I spent 30 minutes with the patient and his wife with face-to-face  encounter and additional 10 minutes in documentation.1 I will see him back in a year or sooner if problems.   Signed,  Gordy Bergamo, MD, [facility] 02/27/2024, 5:00 PM [facility] [address] [place], [place] [address] Phone: [phone]. Fax:  [phone]  "

## 2024-03-15 DIAGNOSIS — N1832 Chronic kidney disease, stage 3b: Secondary | ICD-10-CM | POA: Diagnosis not present

## 2024-03-20 DIAGNOSIS — D2261 Melanocytic nevi of right upper limb, including shoulder: Secondary | ICD-10-CM | POA: Diagnosis not present

## 2024-03-20 DIAGNOSIS — L720 Epidermal cyst: Secondary | ICD-10-CM | POA: Diagnosis not present

## 2024-03-20 DIAGNOSIS — D225 Melanocytic nevi of trunk: Secondary | ICD-10-CM | POA: Diagnosis not present

## 2024-03-20 DIAGNOSIS — L570 Actinic keratosis: Secondary | ICD-10-CM | POA: Diagnosis not present

## 2024-03-20 DIAGNOSIS — D692 Other nonthrombocytopenic purpura: Secondary | ICD-10-CM | POA: Diagnosis not present

## 2024-03-20 DIAGNOSIS — D2262 Melanocytic nevi of left upper limb, including shoulder: Secondary | ICD-10-CM | POA: Diagnosis not present

## 2024-03-20 DIAGNOSIS — L820 Inflamed seborrheic keratosis: Secondary | ICD-10-CM | POA: Diagnosis not present

## 2024-03-20 DIAGNOSIS — L821 Other seborrheic keratosis: Secondary | ICD-10-CM | POA: Diagnosis not present

## 2024-03-20 DIAGNOSIS — Z85828 Personal history of other malignant neoplasm of skin: Secondary | ICD-10-CM | POA: Diagnosis not present

## 2024-03-20 DIAGNOSIS — L814 Other melanin hyperpigmentation: Secondary | ICD-10-CM | POA: Diagnosis not present

## 2024-03-20 DIAGNOSIS — Z85820 Personal history of malignant melanoma of skin: Secondary | ICD-10-CM | POA: Diagnosis not present

## 2024-03-20 DIAGNOSIS — L905 Scar conditions and fibrosis of skin: Secondary | ICD-10-CM | POA: Diagnosis not present

## 2024-03-21 DIAGNOSIS — N400 Enlarged prostate without lower urinary tract symptoms: Secondary | ICD-10-CM | POA: Diagnosis not present

## 2024-03-21 DIAGNOSIS — N184 Chronic kidney disease, stage 4 (severe): Secondary | ICD-10-CM | POA: Diagnosis not present

## 2024-03-21 DIAGNOSIS — N200 Calculus of kidney: Secondary | ICD-10-CM | POA: Diagnosis not present

## 2024-03-21 DIAGNOSIS — I129 Hypertensive chronic kidney disease with stage 1 through stage 4 chronic kidney disease, or unspecified chronic kidney disease: Secondary | ICD-10-CM | POA: Diagnosis not present

## 2024-03-21 DIAGNOSIS — N2581 Secondary hyperparathyroidism of renal origin: Secondary | ICD-10-CM | POA: Diagnosis not present

## 2024-03-21 DIAGNOSIS — R809 Proteinuria, unspecified: Secondary | ICD-10-CM | POA: Diagnosis not present

## 2024-03-26 DIAGNOSIS — N184 Chronic kidney disease, stage 4 (severe): Secondary | ICD-10-CM | POA: Diagnosis not present

## 2024-03-26 DIAGNOSIS — E039 Hypothyroidism, unspecified: Secondary | ICD-10-CM | POA: Diagnosis not present

## 2024-03-26 DIAGNOSIS — I129 Hypertensive chronic kidney disease with stage 1 through stage 4 chronic kidney disease, or unspecified chronic kidney disease: Secondary | ICD-10-CM | POA: Diagnosis not present

## 2024-03-26 DIAGNOSIS — I25810 Atherosclerosis of coronary artery bypass graft(s) without angina pectoris: Secondary | ICD-10-CM | POA: Diagnosis not present

## 2024-03-26 DIAGNOSIS — E1122 Type 2 diabetes mellitus with diabetic chronic kidney disease: Secondary | ICD-10-CM | POA: Diagnosis not present

## 2024-03-26 DIAGNOSIS — R809 Proteinuria, unspecified: Secondary | ICD-10-CM | POA: Diagnosis not present

## 2024-04-02 ENCOUNTER — Encounter: Payer: Self-pay | Admitting: Cardiology

## 2024-04-02 ENCOUNTER — Telehealth: Payer: Self-pay

## 2024-04-02 DIAGNOSIS — M170 Bilateral primary osteoarthritis of knee: Secondary | ICD-10-CM | POA: Diagnosis not present

## 2024-04-02 NOTE — Telephone Encounter (Signed)
 Dr. Ganji  We have received a surgical clearance request for Mr. Deblanc for knee replacement surgery. They were seen recently in clinic on 02/24/2024. Can you please comment on surgical clearance for his knee replacement procedure.. Please forward you guidance and recommendations to [facility] PREOP   Thank you, Jackee Alberts, NP

## 2024-04-02 NOTE — Telephone Encounter (Signed)
   Pre-operative Risk Assessment    Patient Name: Dalton Mitchell  DOB: 03-01-1946 MRN: [id number]   Date of last office visit: 02/24/24 GORDY BERGAMO, MD Date of next office visit: NONE   Request for Surgical Clearance    Procedure:  LEFT UNI KNEE ARTHROPLASTY  Date of Surgery:  Clearance TBD                                Surgeon:  FONDA OLMSTED, MD Surgeon's Group or Practice Name:  [facility] Phone number:  [phone]  [phone] Fax number:  [phone]  ATTN: MAEOLA DIVERS   Type of Clearance Requested:   - Medical  - Pharmacy:  Hold Aspirin      Type of Anesthesia:  Spinal   Additional requests/questions:    SignedLucie DELENA Ku   04/02/2024, 4:55 PM

## 2024-04-05 NOTE — Telephone Encounter (Signed)
 Darice from [facility] called to see if we have received referral sent on  12/27/23   Please advise [phone]

## 2024-04-10 DIAGNOSIS — N1832 Chronic kidney disease, stage 3b: Secondary | ICD-10-CM | POA: Diagnosis not present

## 2024-04-25 NOTE — Telephone Encounter (Signed)
 Low risk and okay to proceed

## 2024-04-25 NOTE — Telephone Encounter (Signed)
   Patient Name: Dalton Mitchell  DOB: [birth year] MRN: [id number]  Primary Cardiologist: Gordy Bergamo, MD  Chart reviewed as part of pre-operative protocol coverage. Given past medical history and time since last visit, based on ACC/AHA guidelines, VERE DIANTONIO is at acceptable risk for the planned procedure without further cardiovascular testing. Dr.Ganji has also reviewed the chart.  The patient was advised that if he develops new symptoms prior to surgery to contact our office to arrange for a follow-up visit, and he verbalized understanding.  I will route this recommendation to the requesting party via Epic fax function and remove from pre-op pool.  Please call with questions.  Lamarr Satterfield, NP 04/25/2024, 3:34 PM

## 2024-05-21 DIAGNOSIS — R053 Chronic cough: Secondary | ICD-10-CM | POA: Diagnosis not present

## 2024-05-21 DIAGNOSIS — R0602 Shortness of breath: Secondary | ICD-10-CM | POA: Diagnosis not present

## 2024-06-01 DIAGNOSIS — E785 Hyperlipidemia, unspecified: Secondary | ICD-10-CM | POA: Diagnosis not present

## 2024-06-01 DIAGNOSIS — E291 Testicular hypofunction: Secondary | ICD-10-CM | POA: Diagnosis not present

## 2024-06-01 DIAGNOSIS — Z125 Encounter for screening for malignant neoplasm of prostate: Secondary | ICD-10-CM | POA: Diagnosis not present

## 2024-06-01 DIAGNOSIS — E039 Hypothyroidism, unspecified: Secondary | ICD-10-CM | POA: Diagnosis not present

## 2024-06-05 DIAGNOSIS — E1122 Type 2 diabetes mellitus with diabetic chronic kidney disease: Secondary | ICD-10-CM | POA: Diagnosis not present

## 2024-06-05 DIAGNOSIS — E785 Hyperlipidemia, unspecified: Secondary | ICD-10-CM | POA: Diagnosis not present

## 2024-06-05 DIAGNOSIS — E039 Hypothyroidism, unspecified: Secondary | ICD-10-CM | POA: Diagnosis not present

## 2024-06-05 DIAGNOSIS — I129 Hypertensive chronic kidney disease with stage 1 through stage 4 chronic kidney disease, or unspecified chronic kidney disease: Secondary | ICD-10-CM | POA: Diagnosis not present

## 2024-06-05 DIAGNOSIS — M109 Gout, unspecified: Secondary | ICD-10-CM | POA: Diagnosis not present

## 2024-06-05 DIAGNOSIS — N184 Chronic kidney disease, stage 4 (severe): Secondary | ICD-10-CM | POA: Diagnosis not present

## 2024-06-13 DIAGNOSIS — N184 Chronic kidney disease, stage 4 (severe): Secondary | ICD-10-CM | POA: Diagnosis not present

## 2024-06-13 DIAGNOSIS — R739 Hyperglycemia, unspecified: Secondary | ICD-10-CM | POA: Diagnosis not present

## 2024-06-20 DIAGNOSIS — G4733 Obstructive sleep apnea (adult) (pediatric): Secondary | ICD-10-CM | POA: Diagnosis not present

## 2024-06-20 DIAGNOSIS — R0602 Shortness of breath: Secondary | ICD-10-CM | POA: Diagnosis not present

## 2024-06-20 DIAGNOSIS — R053 Chronic cough: Secondary | ICD-10-CM | POA: Diagnosis not present

## 2024-07-17 DIAGNOSIS — R0602 Shortness of breath: Secondary | ICD-10-CM | POA: Diagnosis not present

## 2024-07-17 DIAGNOSIS — G4733 Obstructive sleep apnea (adult) (pediatric): Secondary | ICD-10-CM | POA: Diagnosis not present

## 2024-07-17 DIAGNOSIS — R053 Chronic cough: Secondary | ICD-10-CM | POA: Diagnosis not present

## 2024-07-31 DIAGNOSIS — R053 Chronic cough: Secondary | ICD-10-CM | POA: Diagnosis not present

## 2024-08-27 DIAGNOSIS — R82998 Other abnormal findings in urine: Secondary | ICD-10-CM | POA: Diagnosis not present

## 2024-09-03 DIAGNOSIS — R053 Chronic cough: Secondary | ICD-10-CM | POA: Diagnosis not present

## 2024-09-03 DIAGNOSIS — G4733 Obstructive sleep apnea (adult) (pediatric): Secondary | ICD-10-CM | POA: Diagnosis not present

## 2024-09-03 DIAGNOSIS — R0602 Shortness of breath: Secondary | ICD-10-CM | POA: Diagnosis not present

## 2025-02-14 ENCOUNTER — Ambulatory Visit: Admitting: Rheumatology
